# Patient Record
Sex: Male | Born: 1950 | Race: White | Hispanic: No | Marital: Married | Smoking: Former smoker
Health system: Southern US, Community
[De-identification: ages and names within clinical notes are randomized; demographics above are authoritative.]

## PROBLEM LIST (undated history)

## (undated) DIAGNOSIS — R001 Bradycardia, unspecified: Secondary | ICD-10-CM

## (undated) DIAGNOSIS — N189 Chronic kidney disease, unspecified: Secondary | ICD-10-CM

## (undated) DIAGNOSIS — M1990 Unspecified osteoarthritis, unspecified site: Secondary | ICD-10-CM

## (undated) DIAGNOSIS — Z8701 Personal history of pneumonia (recurrent): Secondary | ICD-10-CM

## (undated) DIAGNOSIS — I2510 Atherosclerotic heart disease of native coronary artery without angina pectoris: Secondary | ICD-10-CM

## (undated) DIAGNOSIS — I10 Essential (primary) hypertension: Secondary | ICD-10-CM

## (undated) DIAGNOSIS — K429 Umbilical hernia without obstruction or gangrene: Secondary | ICD-10-CM

## (undated) DIAGNOSIS — E785 Hyperlipidemia, unspecified: Secondary | ICD-10-CM

## (undated) DIAGNOSIS — R160 Hepatomegaly, not elsewhere classified: Secondary | ICD-10-CM

## (undated) DIAGNOSIS — IMO0002 Reserved for concepts with insufficient information to code with codable children: Secondary | ICD-10-CM

## (undated) DIAGNOSIS — F419 Anxiety disorder, unspecified: Secondary | ICD-10-CM

## (undated) HISTORY — PX: COLONOSCOPY: SHX174

## (undated) HISTORY — DX: Essential (primary) hypertension: I10

## (undated) HISTORY — DX: Chronic kidney disease, unspecified: N18.9

## (undated) HISTORY — PX: BACK SURGERY: SHX140

## (undated) HISTORY — PX: CERVICAL SPINE SURGERY: SHX589

## (undated) HISTORY — DX: Anxiety disorder, unspecified: F41.9

## (undated) HISTORY — DX: Umbilical hernia without obstruction or gangrene: K42.9

## (undated) HISTORY — DX: Unspecified osteoarthritis, unspecified site: M19.90

## (undated) HISTORY — DX: Hyperlipidemia, unspecified: E78.5

## (undated) HISTORY — DX: Hepatomegaly, not elsewhere classified: R16.0

## (undated) HISTORY — DX: Reserved for concepts with insufficient information to code with codable children: IMO0002

---
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
## 1986-02-14 HISTORY — PX: UMBILICAL HERNIA REPAIR: SHX196

## 1989-02-14 HISTORY — PX: ROTATOR CUFF REPAIR: SHX139

## 1998-04-08 ENCOUNTER — Ambulatory Visit (HOSPITAL_COMMUNITY): Admission: RE | Admit: 1998-04-08 | Discharge: 1998-04-08 | Payer: Self-pay | Admitting: Neurological Surgery

## 1998-04-08 ENCOUNTER — Encounter: Payer: Self-pay | Admitting: Neurological Surgery

## 1998-04-15 ENCOUNTER — Encounter: Payer: Self-pay | Admitting: Neurological Surgery

## 1998-04-15 ENCOUNTER — Ambulatory Visit (HOSPITAL_COMMUNITY): Admission: RE | Admit: 1998-04-15 | Discharge: 1998-04-15 | Payer: Self-pay | Admitting: Neurosurgery

## 1998-08-11 ENCOUNTER — Ambulatory Visit (HOSPITAL_COMMUNITY)
Admission: RE | Admit: 1998-08-11 | Discharge: 1998-08-11 | Payer: Self-pay | Admitting: Physical Medicine & Rehabilitation

## 1998-08-11 ENCOUNTER — Encounter: Payer: Self-pay | Admitting: Physical Medicine & Rehabilitation

## 1998-08-25 ENCOUNTER — Ambulatory Visit (HOSPITAL_COMMUNITY)
Admission: RE | Admit: 1998-08-25 | Discharge: 1998-08-25 | Payer: Self-pay | Admitting: Physical Medicine & Rehabilitation

## 1998-08-25 ENCOUNTER — Encounter: Payer: Self-pay | Admitting: Physical Medicine & Rehabilitation

## 1998-09-08 ENCOUNTER — Ambulatory Visit (HOSPITAL_COMMUNITY)
Admission: RE | Admit: 1998-09-08 | Discharge: 1998-09-08 | Payer: Self-pay | Admitting: Physical Medicine & Rehabilitation

## 1998-09-08 ENCOUNTER — Encounter: Payer: Self-pay | Admitting: Physical Medicine & Rehabilitation

## 1998-09-30 ENCOUNTER — Encounter: Payer: Self-pay | Admitting: Specialist

## 1998-09-30 ENCOUNTER — Ambulatory Visit (HOSPITAL_COMMUNITY)
Admission: RE | Admit: 1998-09-30 | Discharge: 1998-09-30 | Payer: Self-pay | Admitting: Physical Medicine & Rehabilitation

## 1998-10-06 ENCOUNTER — Encounter: Payer: Self-pay | Admitting: Emergency Medicine

## 1998-10-06 ENCOUNTER — Emergency Department (HOSPITAL_COMMUNITY): Admission: EM | Admit: 1998-10-06 | Discharge: 1998-10-06 | Payer: Self-pay | Admitting: Emergency Medicine

## 1998-10-07 ENCOUNTER — Ambulatory Visit (HOSPITAL_COMMUNITY): Admission: RE | Admit: 1998-10-07 | Discharge: 1998-10-07 | Payer: Self-pay | Admitting: *Deleted

## 1998-12-01 ENCOUNTER — Ambulatory Visit (HOSPITAL_COMMUNITY): Admission: RE | Admit: 1998-12-01 | Discharge: 1998-12-01 | Payer: Self-pay | Admitting: Neurological Surgery

## 1998-12-01 ENCOUNTER — Encounter: Payer: Self-pay | Admitting: Neurological Surgery

## 1998-12-10 ENCOUNTER — Encounter: Payer: Self-pay | Admitting: Neurological Surgery

## 1998-12-14 ENCOUNTER — Inpatient Hospital Stay (HOSPITAL_COMMUNITY): Admission: RE | Admit: 1998-12-14 | Discharge: 1998-12-18 | Payer: Self-pay | Admitting: Neurological Surgery

## 1998-12-14 ENCOUNTER — Encounter: Payer: Self-pay | Admitting: Neurological Surgery

## 1998-12-31 ENCOUNTER — Encounter: Payer: Self-pay | Admitting: Neurological Surgery

## 1998-12-31 ENCOUNTER — Encounter: Admission: RE | Admit: 1998-12-31 | Discharge: 1998-12-31 | Payer: Self-pay | Admitting: Neurological Surgery

## 1999-02-10 ENCOUNTER — Encounter: Admission: RE | Admit: 1999-02-10 | Discharge: 1999-02-10 | Payer: Self-pay | Admitting: Neurological Surgery

## 1999-02-10 ENCOUNTER — Encounter: Payer: Self-pay | Admitting: Neurological Surgery

## 1999-03-16 ENCOUNTER — Encounter: Admission: RE | Admit: 1999-03-16 | Discharge: 1999-06-14 | Payer: Self-pay | Admitting: Neurological Surgery

## 1999-04-14 ENCOUNTER — Encounter: Payer: Self-pay | Admitting: Neurological Surgery

## 1999-04-14 ENCOUNTER — Encounter: Admission: RE | Admit: 1999-04-14 | Discharge: 1999-04-14 | Payer: Self-pay | Admitting: Neurological Surgery

## 2006-11-24 ENCOUNTER — Encounter: Admission: RE | Admit: 2006-11-24 | Discharge: 2006-11-24 | Payer: Self-pay | Admitting: Gastroenterology

## 2009-07-18 ENCOUNTER — Emergency Department (HOSPITAL_COMMUNITY): Admission: EM | Admit: 2009-07-18 | Discharge: 2009-07-18 | Payer: Self-pay | Admitting: Emergency Medicine

## 2010-05-03 LAB — BASIC METABOLIC PANEL
BUN: 15 mg/dL (ref 6–23)
CO2: 27 mEq/L (ref 19–32)
Calcium: 9 mg/dL (ref 8.4–10.5)
Chloride: 106 mEq/L (ref 96–112)
Creatinine, Ser: 0.94 mg/dL (ref 0.4–1.5)
GFR calc Af Amer: >60 mL/min (ref >60)
GFR calc non Af Amer: >60 mL/min (ref >60)
Glucose, Bld: 82 mg/dL (ref 70–99)
Potassium: 4 mEq/L (ref 3.5–5.1)
Sodium: 138 mEq/L (ref 135–145)

## 2010-05-03 LAB — DIFFERENTIAL
Basophils Absolute: 0 10*3/uL (ref 0.0–0.1)
Basophils Relative: 1 % (ref 0–1)
Eosinophils Absolute: 0 10*3/uL (ref 0.0–0.7)
Eosinophils Relative: 0 % (ref 0–5)
Lymphocytes Relative: 35 % (ref 12–46)
Lymphs Abs: 2.6 10*3/uL (ref 0.7–4.0)
Monocytes Absolute: 1 10*3/uL (ref 0.1–1.0)
Monocytes Relative: 13 % — ABNORMAL HIGH (ref 3–12)
Neutro Abs: 3.8 10*3/uL (ref 1.7–7.7)
Neutrophils Relative %: 51 % (ref 43–77)

## 2010-05-03 LAB — CBC
HCT: 45 % (ref 39.0–52.0)
Hemoglobin: 15.7 g/dL (ref 13.0–17.0)
MCHC: 34.8 g/dL (ref 30.0–36.0)
MCV: 91.3 fL (ref 78.0–100.0)
Platelets: 267 10*3/uL (ref 150–400)
RBC: 4.93 MIL/uL (ref 4.22–5.81)
RDW: 13.6 % (ref 11.5–15.5)
WBC: 7.4 10*3/uL (ref 4.0–10.5)

## 2010-05-03 LAB — URINALYSIS, ROUTINE W REFLEX MICROSCOPIC
Bilirubin Urine: NEGATIVE
Glucose, UA: NEGATIVE mg/dL
Hgb urine dipstick: NEGATIVE
Ketones, ur: NEGATIVE mg/dL
Nitrite: NEGATIVE
Protein, ur: NEGATIVE mg/dL
Specific Gravity, Urine: 1.026 (ref 1.005–1.030)
Urobilinogen, UA: 0.2 mg/dL (ref 0.0–1.0)
pH: 5.5 (ref 5.0–8.0)

## 2010-05-03 LAB — ETHANOL: Alcohol, Ethyl (B): <5 mg/dL (ref 0–10)
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
## 2010-08-15 HISTORY — PX: NM MYOCAR PERF WALL MOTION: HXRAD629

## 2011-03-13 ENCOUNTER — Ambulatory Visit (INDEPENDENT_AMBULATORY_CARE_PROVIDER_SITE_OTHER): Payer: 59

## 2011-03-13 DIAGNOSIS — J029 Acute pharyngitis, unspecified: Secondary | ICD-10-CM

## 2011-03-14 ENCOUNTER — Encounter: Payer: Self-pay | Admitting: *Deleted

## 2011-03-14 DIAGNOSIS — E1169 Type 2 diabetes mellitus with other specified complication: Secondary | ICD-10-CM | POA: Insufficient documentation

## 2011-03-14 DIAGNOSIS — I10 Essential (primary) hypertension: Secondary | ICD-10-CM

## 2011-03-14 DIAGNOSIS — E1159 Type 2 diabetes mellitus with other circulatory complications: Secondary | ICD-10-CM | POA: Insufficient documentation

## 2011-03-14 DIAGNOSIS — I152 Hypertension secondary to endocrine disorders: Secondary | ICD-10-CM | POA: Insufficient documentation

## 2011-03-14 DIAGNOSIS — E785 Hyperlipidemia, unspecified: Secondary | ICD-10-CM

## 2011-04-12 ENCOUNTER — Ambulatory Visit (INDEPENDENT_AMBULATORY_CARE_PROVIDER_SITE_OTHER): Payer: 59 | Admitting: Emergency Medicine

## 2011-04-12 DIAGNOSIS — I10 Essential (primary) hypertension: Secondary | ICD-10-CM

## 2011-04-12 DIAGNOSIS — E782 Mixed hyperlipidemia: Secondary | ICD-10-CM

## 2011-04-12 DIAGNOSIS — E119 Type 2 diabetes mellitus without complications: Secondary | ICD-10-CM

## 2011-04-12 DIAGNOSIS — E785 Hyperlipidemia, unspecified: Secondary | ICD-10-CM

## 2011-04-12 LAB — COMPREHENSIVE METABOLIC PANEL
ALT: 27 U/L (ref 0–53)
AST: 21 U/L (ref 0–37)
Albumin: 4.4 g/dL (ref 3.5–5.2)
Alkaline Phosphatase: 80 U/L (ref 39–117)
BUN: 17 mg/dL (ref 6–23)
CO2: 22 mEq/L (ref 19–32)
Calcium: 9.3 mg/dL (ref 8.4–10.5)
Chloride: 109 mEq/L (ref 96–112)
Creat: 0.98 mg/dL (ref 0.50–1.35)
Glucose, Bld: 117 mg/dL — ABNORMAL HIGH (ref 70–99)
Potassium: 4.7 mEq/L (ref 3.5–5.3)
Sodium: 140 mEq/L (ref 135–145)
Total Bilirubin: 0.5 mg/dL (ref 0.3–1.2)
Total Protein: 6.7 g/dL (ref 6.0–8.3)

## 2011-04-12 LAB — LIPID PANEL
Cholesterol: 120 mg/dL (ref 0–200)
HDL: 37 mg/dL — ABNORMAL LOW (ref >39)
LDL Cholesterol: 69 mg/dL (ref 0–99)
Total CHOL/HDL Ratio: 3.2 Ratio
Triglycerides: 71 mg/dL (ref <150)
VLDL: 14 mg/dL (ref 0–40)

## 2011-04-12 LAB — POCT GLYCOSYLATED HEMOGLOBIN (HGB A1C): Hemoglobin A1C: 6.1

## 2011-04-12 LAB — GLUCOSE, POCT (MANUAL RESULT ENTRY): POC Glucose: 124

## 2011-04-12 NOTE — Progress Notes (Signed)
  Subjective:    Patient ID: Seth Pope, male    DOB: [birth year], 61 y.o.   MRN: [id number]  HPI issue management although his diabetes. He is actually doing very well. He is following his diet exercising and taking his medications as instructed. He says his sugars have been running    Review of Systems  Constitutional: Negative.   HENT: Negative.   Eyes: Negative.   Respiratory: Negative.   Cardiovascular: Negative.   Gastrointestinal: Negative.   Genitourinary: Negative.   Musculoskeletal: Negative.   Neurological: Negative.   Hematological: Negative.   Psychiatric/Behavioral: Negative.        Objective:   Physical Exam  Constitutional: He appears well-nourished.  HENT:  Head: Normocephalic and atraumatic.  Right Ear: External ear normal.  Left Ear: External ear normal.  Nose: Nose normal.  Mouth/Throat: Oropharynx is clear and moist.  Eyes: EOM are normal. Pupils are equal, round, and reactive to light.  Neck: No JVD present. No tracheal deviation present. No thyromegaly present.  Cardiovascular: Normal rate, regular rhythm, normal heart sounds and intact distal pulses.   Pulmonary/Chest: No respiratory distress. He has no wheezes. He has no rales. He exhibits no tenderness.  Abdominal: He exhibits no distension. There is no tenderness. There is no rebound.  Lymphadenopathy:    He has no cervical adenopathy.  Neurological:       Patient had normal sensation to his feet with good distal pulses.          Assessment & Plan:    Patient is done well with his diabetes we'll check a hemoglobin A1c and glucose.If These are acceptable recheck 3-4 months.

## 2011-05-22 ENCOUNTER — Ambulatory Visit (INDEPENDENT_AMBULATORY_CARE_PROVIDER_SITE_OTHER): Payer: 59 | Admitting: Emergency Medicine

## 2011-05-22 VITALS — BP 146/79 | HR 67 | Temp 98.6°F | Resp 16 | Ht 69.5 in | Wt 197.0 lb

## 2011-05-22 DIAGNOSIS — J019 Acute sinusitis, unspecified: Secondary | ICD-10-CM

## 2011-05-22 DIAGNOSIS — J329 Chronic sinusitis, unspecified: Secondary | ICD-10-CM

## 2011-05-22 DIAGNOSIS — J029 Acute pharyngitis, unspecified: Secondary | ICD-10-CM

## 2011-05-22 DIAGNOSIS — R070 Pain in throat: Secondary | ICD-10-CM

## 2011-05-22 LAB — POCT RAPID STREP A (OFFICE): Rapid Strep A Screen: NEGATIVE

## 2011-05-22 MED ORDER — DOXYCYCLINE HYCLATE 100 MG PO TABS: 100.0000 mg | ORAL_TABLET | Freq: Two times a day (BID) | ORAL | Status: AC

## 2011-05-22 NOTE — Progress Notes (Signed)
  Subjective:    Patient ID: Seth Pope, male    DOB: 16-Oct-1950, 61 y.o.   MRN: [id number]  HPI patient should 11 day history of sore throat associated with eustachian tube dysfunction. [place] and developed severe pain in his throat and sinus area. He denies any cough.    Review of Systems patient has a pertinent history in that he has diabetes high cholesterol high blood pressure but all these problems are under what her control. His most recent checkup was excellent    Objective:   Physical Exam  Constitutional: He appears well-developed and well-nourished.  HENT:  Head: Normocephalic.  Right Ear: External ear normal.  Left Ear: External ear normal.       The throat is erythematous however there is no exudate noted.  Eyes: Pupils are equal, round, and reactive to light.  Neck: No JVD present. No tracheal deviation present. No thyromegaly present.  Cardiovascular: Normal rate and regular rhythm.   Lymphadenopathy:    He has no cervical adenopathy.          Assessment & Plan:  We'll check a strep test and treat his sinuses. Rapid strep was negative.

## 2011-06-21 ENCOUNTER — Other Ambulatory Visit: Payer: Self-pay | Admitting: Physician Assistant

## 2011-06-30 ENCOUNTER — Other Ambulatory Visit: Payer: Self-pay | Admitting: Emergency Medicine

## 2011-07-24 ENCOUNTER — Other Ambulatory Visit: Payer: Self-pay | Admitting: Physician Assistant

## 2011-08-21 ENCOUNTER — Other Ambulatory Visit: Payer: Self-pay | Admitting: Physician Assistant

## 2011-09-17 ENCOUNTER — Other Ambulatory Visit: Payer: Self-pay | Admitting: Physician Assistant

## 2011-09-24 ENCOUNTER — Other Ambulatory Visit: Payer: Self-pay | Admitting: Physician Assistant

## 2011-09-27 ENCOUNTER — Ambulatory Visit (INDEPENDENT_AMBULATORY_CARE_PROVIDER_SITE_OTHER): Payer: 59 | Admitting: Emergency Medicine

## 2011-09-27 ENCOUNTER — Encounter: Payer: Self-pay | Admitting: Emergency Medicine

## 2011-09-27 VITALS — BP 110/58 | HR 63 | Temp 98.3°F | Resp 16 | Ht 68.5 in | Wt 195.0 lb

## 2011-09-27 DIAGNOSIS — E782 Mixed hyperlipidemia: Secondary | ICD-10-CM

## 2011-09-27 DIAGNOSIS — E785 Hyperlipidemia, unspecified: Secondary | ICD-10-CM

## 2011-09-27 DIAGNOSIS — G4700 Insomnia, unspecified: Secondary | ICD-10-CM

## 2011-09-27 DIAGNOSIS — I10 Essential (primary) hypertension: Secondary | ICD-10-CM

## 2011-09-27 DIAGNOSIS — E119 Type 2 diabetes mellitus without complications: Secondary | ICD-10-CM

## 2011-09-27 LAB — COMPREHENSIVE METABOLIC PANEL
ALT: 23 U/L (ref 0–53)
AST: 18 U/L (ref 0–37)
Albumin: 4.1 g/dL (ref 3.5–5.2)
Alkaline Phosphatase: 75 U/L (ref 39–117)
BUN: 20 mg/dL (ref 6–23)
CO2: 24 mEq/L (ref 19–32)
Calcium: 8.9 mg/dL (ref 8.4–10.5)
Chloride: 108 mEq/L (ref 96–112)
Creat: 0.96 mg/dL (ref 0.50–1.35)
Glucose, Bld: 114 mg/dL — ABNORMAL HIGH (ref 70–99)
Potassium: 4.4 mEq/L (ref 3.5–5.3)
Sodium: 141 mEq/L (ref 135–145)
Total Bilirubin: 0.3 mg/dL (ref 0.3–1.2)
Total Protein: 6.2 g/dL (ref 6.0–8.3)

## 2011-09-27 LAB — LIPID PANEL
Cholesterol: 132 mg/dL (ref 0–200)
HDL: 38 mg/dL — ABNORMAL LOW (ref >39)
LDL Cholesterol: 72 mg/dL (ref 0–99)
Total CHOL/HDL Ratio: 3.5 Ratio
Triglycerides: 110 mg/dL (ref <150)
VLDL: 22 mg/dL (ref 0–40)

## 2011-09-27 LAB — POCT GLYCOSYLATED HEMOGLOBIN (HGB A1C): Hemoglobin A1C: 6

## 2011-09-27 LAB — GLUCOSE, POCT (MANUAL RESULT ENTRY): POC Glucose: 121 mg/dl — AB (ref 70–99)

## 2011-09-27 MED ORDER — FENOFIBRATE 48 MG PO TABS: 48.0000 mg | ORAL_TABLET | Freq: Every day | ORAL | Status: DC

## 2011-09-27 MED ORDER — ATORVASTATIN CALCIUM 80 MG PO TABS: 80.0000 mg | ORAL_TABLET | Freq: Every day | ORAL | Status: DC

## 2011-09-27 MED ORDER — ALPRAZOLAM 0.25 MG PO TABS: 0.2500 mg | ORAL_TABLET | Freq: Four times a day (QID) | ORAL | Status: DC | PRN

## 2011-09-27 MED ORDER — LISINOPRIL-HYDROCHLOROTHIAZIDE 20-12.5 MG PO TABS: 1.0000 | ORAL_TABLET | Freq: Every day | ORAL | Status: DC

## 2011-09-27 MED ORDER — DESIPRAMINE HCL 50 MG PO TABS: 150.0000 mg | ORAL_TABLET | Freq: Every evening | ORAL | Status: DC | PRN

## 2011-09-27 MED ORDER — METOPROLOL SUCCINATE ER 200 MG PO TB24: 200.0000 mg | ORAL_TABLET | Freq: Every day | ORAL | Status: DC

## 2011-09-27 MED ORDER — METFORMIN HCL ER 500 MG PO TB24: 500.0000 mg | ORAL_TABLET | Freq: Two times a day (BID) | ORAL | Status: DC

## 2011-09-27 NOTE — Progress Notes (Signed)
  Subjective:    Patient ID: Seth Pope, male    DOB: Nov 17, 1950, 61 y.o.   MRN: [id number]  HPI patient in the followup of diabetes high blood and high cholesterol. He is doing well without specific problems. He denies chest pain shortness of breath or other new symptoms to    Review of Systems     Objective:   Physical Exam  Constitutional: He is oriented to person, place, and time. He appears well-developed and well-nourished.  HENT:  Head: Normocephalic and atraumatic.  Eyes: Pupils are equal, round, and reactive to light.  Neck: Normal range of motion. Thyromegaly present.  Cardiovascular: Normal rate and regular rhythm.   Pulmonary/Chest: Effort normal and breath sounds normal.  Abdominal: Soft. Bowel sounds are normal.  Neurological: He is alert and oriented to person, place, and time. He has normal reflexes.      Results for orders placed in visit on 09/27/11  GLUCOSE, POCT (MANUAL RESULT ENTRY)      Component Value Range   POC Glucose 121 (*) 70 - 99 mg/dl  POCT GLYCOSYLATED HEMOGLOBIN (HGB A1C)      Component Value Range   Hemoglobin A1C 6.0        Assessment & Plan:  Blood pressure and diabetes are at goal. No changes at present.

## 2011-09-28 ENCOUNTER — Other Ambulatory Visit: Payer: Self-pay | Admitting: Emergency Medicine

## 2011-11-21 ENCOUNTER — Other Ambulatory Visit: Payer: Self-pay | Admitting: Radiology

## 2011-11-21 DIAGNOSIS — E785 Hyperlipidemia, unspecified: Secondary | ICD-10-CM

## 2011-11-21 MED ORDER — FENOFIBRATE 48 MG PO TABS: 48.0000 mg | ORAL_TABLET | Freq: Every day | ORAL | Status: DC

## 2012-01-31 ENCOUNTER — Encounter: Payer: Self-pay | Admitting: Emergency Medicine

## 2012-01-31 ENCOUNTER — Ambulatory Visit (INDEPENDENT_AMBULATORY_CARE_PROVIDER_SITE_OTHER): Payer: 59 | Admitting: Emergency Medicine

## 2012-01-31 VITALS — BP 118/78 | HR 71 | Temp 98.3°F | Resp 16 | Ht 69.0 in | Wt 191.6 lb

## 2012-01-31 DIAGNOSIS — Z23 Encounter for immunization: Secondary | ICD-10-CM

## 2012-01-31 DIAGNOSIS — L678 Other hair color and hair shaft abnormalities: Secondary | ICD-10-CM

## 2012-01-31 DIAGNOSIS — E1149 Type 2 diabetes mellitus with other diabetic neurological complication: Secondary | ICD-10-CM

## 2012-01-31 DIAGNOSIS — E1140 Type 2 diabetes mellitus with diabetic neuropathy, unspecified: Secondary | ICD-10-CM

## 2012-01-31 DIAGNOSIS — L738 Other specified follicular disorders: Secondary | ICD-10-CM

## 2012-01-31 DIAGNOSIS — G629 Polyneuropathy, unspecified: Secondary | ICD-10-CM

## 2012-01-31 DIAGNOSIS — E1142 Type 2 diabetes mellitus with diabetic polyneuropathy: Secondary | ICD-10-CM

## 2012-01-31 DIAGNOSIS — G589 Mononeuropathy, unspecified: Secondary | ICD-10-CM

## 2012-01-31 DIAGNOSIS — L739 Follicular disorder, unspecified: Secondary | ICD-10-CM

## 2012-01-31 LAB — LIPID PANEL
Cholesterol: 103 mg/dL (ref 0–200)
HDL: 43 mg/dL (ref >39)
LDL Cholesterol: 46 mg/dL (ref 0–99)
Total CHOL/HDL Ratio: 2.4 Ratio
Triglycerides: 71 mg/dL (ref <150)
VLDL: 14 mg/dL (ref 0–40)

## 2012-01-31 LAB — GLUCOSE, POCT (MANUAL RESULT ENTRY): POC Glucose: 139 mg/dl — AB (ref 70–99)

## 2012-01-31 LAB — COMPREHENSIVE METABOLIC PANEL
ALT: 25 U/L (ref 0–53)
AST: 22 U/L (ref 0–37)
Albumin: 4.2 g/dL (ref 3.5–5.2)
Alkaline Phosphatase: 67 U/L (ref 39–117)
BUN: 14 mg/dL (ref 6–23)
CO2: 23 mEq/L (ref 19–32)
Calcium: 9.3 mg/dL (ref 8.4–10.5)
Chloride: 106 mEq/L (ref 96–112)
Creat: 0.97 mg/dL (ref 0.50–1.35)
Glucose, Bld: 128 mg/dL — ABNORMAL HIGH (ref 70–99)
Potassium: 4.4 mEq/L (ref 3.5–5.3)
Sodium: 138 mEq/L (ref 135–145)
Total Bilirubin: 0.6 mg/dL (ref 0.3–1.2)
Total Protein: 6.4 g/dL (ref 6.0–8.3)

## 2012-01-31 LAB — POCT GLYCOSYLATED HEMOGLOBIN (HGB A1C): Hemoglobin A1C: 6.4

## 2012-01-31 MED ORDER — MUPIROCIN 2 % EX OINT: TOPICAL_OINTMENT | Freq: Three times a day (TID) | CUTANEOUS | Status: DC

## 2012-01-31 MED ORDER — GABAPENTIN 300 MG PO CAPS: ORAL_CAPSULE | ORAL | Status: DC

## 2012-01-31 NOTE — Progress Notes (Signed)
  Subjective:    Patient ID: Seth Pope, male    DOB: 26-Apr-1950, 61 y.o.   MRN: [id number]  HPI problem #1 diabetes. Diabetes has been under good control. He continues on his medication regular. He has experienced some tingling numb sensation in the metatarsal area of both feet. He has had no pain in this area. Problem #2 hypertension. He continues on his medication for hypertension monitors his blood pressure and it has been running well. Problem #3 sores on the left side of his head in the winter months he has some dry skin which tends to breakdown and he uses Neosporin to these areas. Problem number for her high cholesterol and triglycerides. He continues on diet and medications to keep this under control.    Review of Systems     Objective:   Physical Exam HEENT exam reveals some abraded areas on the left side of his head. His neck is supple there are no carotid bruits. His chest is clear to auscultation and percussion. His cardiac exam reveals a regular rate without murmurs the abdomen is soft liver and spleen are not enlarged there are no areas of tenderness. Examination of the feet reveal pulses to be 2+ dorsalis pedis and posterior tibial. He has decreased sensation from mid foot down on both feet.        Assessment & Plan:  Medical problems are stable except for development of peripheral neuropathy. We'll give a trial of gabapentin to see if we can get him some relief. His of folliculitis on the left side of his forehead where he has scratched his skin and uninfected. I will cover this with Bactroban ointment. He was also given a flu shot today to update him to

## 2012-02-01 ENCOUNTER — Encounter: Payer: Self-pay | Admitting: Radiology

## 2012-02-12 ENCOUNTER — Other Ambulatory Visit: Payer: Self-pay | Admitting: Physician Assistant

## 2012-05-11 NOTE — Progress Notes (Signed)
Prior auth approved for fenofibrate 48 mg through 05/11/13. Pharm notified.

## 2012-05-15 ENCOUNTER — Ambulatory Visit (INDEPENDENT_AMBULATORY_CARE_PROVIDER_SITE_OTHER): Payer: 59 | Admitting: Emergency Medicine

## 2012-05-15 ENCOUNTER — Telehealth: Payer: Self-pay | Admitting: Radiology

## 2012-05-15 VITALS — BP 118/66 | HR 66 | Temp 96.7°F | Resp 16 | Ht 70.5 in | Wt 190.0 lb

## 2012-05-15 DIAGNOSIS — R109 Unspecified abdominal pain: Secondary | ICD-10-CM

## 2012-05-15 DIAGNOSIS — G629 Polyneuropathy, unspecified: Secondary | ICD-10-CM

## 2012-05-15 DIAGNOSIS — G4700 Insomnia, unspecified: Secondary | ICD-10-CM

## 2012-05-15 DIAGNOSIS — I10 Essential (primary) hypertension: Secondary | ICD-10-CM

## 2012-05-15 DIAGNOSIS — E119 Type 2 diabetes mellitus without complications: Secondary | ICD-10-CM

## 2012-05-15 DIAGNOSIS — E785 Hyperlipidemia, unspecified: Secondary | ICD-10-CM

## 2012-05-15 DIAGNOSIS — R1030 Lower abdominal pain, unspecified: Secondary | ICD-10-CM

## 2012-05-15 LAB — LIPID PANEL
Cholesterol: 109 mg/dL (ref 0–200)
HDL: 38 mg/dL — ABNORMAL LOW (ref >39)
LDL Cholesterol: 55 mg/dL (ref 0–99)
Total CHOL/HDL Ratio: 2.9 Ratio
Triglycerides: 80 mg/dL (ref <150)
VLDL: 16 mg/dL (ref 0–40)

## 2012-05-15 LAB — COMPREHENSIVE METABOLIC PANEL
ALT: 19 U/L (ref 0–53)
AST: 17 U/L (ref 0–37)
Albumin: 4.3 g/dL (ref 3.5–5.2)
Alkaline Phosphatase: 75 U/L (ref 39–117)
BUN: 17 mg/dL (ref 6–23)
CO2: 22 mEq/L (ref 19–32)
Calcium: 9.5 mg/dL (ref 8.4–10.5)
Chloride: 105 mEq/L (ref 96–112)
Creat: 1.02 mg/dL (ref 0.50–1.35)
Glucose, Bld: 128 mg/dL — ABNORMAL HIGH (ref 70–99)
Potassium: 4.3 mEq/L (ref 3.5–5.3)
Sodium: 139 mEq/L (ref 135–145)
Total Bilirubin: 0.5 mg/dL (ref 0.3–1.2)
Total Protein: 6.6 g/dL (ref 6.0–8.3)

## 2012-05-15 LAB — POCT GLYCOSYLATED HEMOGLOBIN (HGB A1C): Hemoglobin A1C: 6.5

## 2012-05-15 LAB — GLUCOSE, POCT (MANUAL RESULT ENTRY): POC Glucose: 153 mg/dl — AB (ref 70–99)

## 2012-05-15 MED ORDER — GABAPENTIN 300 MG PO CAPS: ORAL_CAPSULE | ORAL | Status: DC

## 2012-05-15 MED ORDER — ALPRAZOLAM 0.25 MG PO TABS: 0.2500 mg | ORAL_TABLET | Freq: Every evening | ORAL | Status: DC | PRN

## 2012-05-15 NOTE — Progress Notes (Signed)
  Subjective:    Patient ID: Seth Pope, male    DOB: August 08, 1950, 62 y.o.   MRN: [id number]  HPI patient has a followup his high blood pressure diabetes high cholesterol and diabetic neuropathy. He also recently has been having pain in his right groin. This started about a month ago and he has increased pain when he lifts or pushes or touches the area. He has had back surgery in the past. He recently went to his optometrist for an evaluation which revealed no diabetic changes.    Review of Systems     Objective:   Physical Exam HEENT is unremarkable. Neck is supple. There are no carotid bruits. Cardiac exam is regular rate and rhythm abdomen is flat and no masses felt extremity exam reveals decreased sensation across the metatarsals bilaterally pulses are 1+ and symmetrical. Genitourinary exam reveals tenderness at the right external inguinal ring. I do not feel a definite hernia and the testicle felt normal.        Assessment & Plan:  Routine labs were done today. Referral made to surgery for evaluation for possible right inguinal hernia. I did increase his Neurontin to take one in the morning and 2 at night to see if that would help with his neuropathy symptoms. He has discomfort along the metatarsals of both feet.

## 2012-05-15 NOTE — Telephone Encounter (Signed)
Patient asking about his Tricor, Dr Cleta Alberts is with him. Dr Cleta Alberts advised Britta Mccreedy has gotten authorization for this for 1 year.

## 2012-05-21 NOTE — Progress Notes (Signed)
Please call patient see if he can get the name of the cardiologist he saw for his stress test

## 2012-05-22 ENCOUNTER — Encounter (INDEPENDENT_AMBULATORY_CARE_PROVIDER_SITE_OTHER): Payer: Self-pay | Admitting: General Surgery

## 2012-05-22 ENCOUNTER — Ambulatory Visit (INDEPENDENT_AMBULATORY_CARE_PROVIDER_SITE_OTHER): Payer: 59 | Admitting: General Surgery

## 2012-05-22 VITALS — BP 128/84 | HR 68 | Temp 97.8°F | Resp 16 | Ht 70.5 in | Wt 192.6 lb

## 2012-05-22 DIAGNOSIS — R1031 Right lower quadrant pain: Secondary | ICD-10-CM | POA: Insufficient documentation

## 2012-05-22 NOTE — Patient Instructions (Signed)
Plan for ct pelvis to look for right inguinal hernia

## 2012-05-22 NOTE — Progress Notes (Signed)
Subjective:     Patient ID: Seth Pope, male   DOB: 11/03/1950, 62 y.o.   MRN: [id number]  HPI We are asked to see the patient in consultation by Dr. Cleta Alberts to evaluate him for a right inguinal hernia. The patient is a 62 year old white male who began having right groin pain a couple months ago. He has not noticed a bulge in that area. He has had one episode of nausea associated with pain. He otherwise has no problems moving his bowels.  Review of Systems  Constitutional: Negative.   HENT: Negative.   Eyes: Negative.   Respiratory: Negative.   Cardiovascular: Negative.   Gastrointestinal: Negative.   Endocrine: Negative.   Genitourinary: Negative.   Musculoskeletal: Negative.   Skin: Negative.   Allergic/Immunologic: Negative.   Neurological: Negative.   Hematological: Negative.   Psychiatric/Behavioral: Negative.        Objective:   Physical Exam  Constitutional: He is oriented to person, place, and time. He appears well-developed and well-nourished.  HENT:  Head: Normocephalic and atraumatic.  Eyes: Conjunctivae and EOM are normal. Pupils are equal, round, and reactive to light.  Neck: Normal range of motion. Neck supple.  Cardiovascular: Normal rate, regular rhythm and normal heart sounds.   Pulmonary/Chest: Effort normal and breath sounds normal.  Abdominal: Soft. Bowel sounds are normal.  Genitourinary:  There is pain with palpation of the right inguinal canal but no obvious bulge or impulse with straining. There is no palpable bulge or impulse with straining in the left groin.  Musculoskeletal: Normal range of motion.  Neurological: He is alert and oriented to person, place, and time.  Skin: Skin is warm and dry.  Psychiatric: He has a normal mood and affect. His behavior is normal.       Assessment:     The patient has significant right groin pain but clinically I cannot appreciate an inguinal hernia at this time. Because of the sewed recommend a limited CT of the  pelvis to look for radiographic evidence of a hernia. If he does have a right inguinal hernia I have discussed with him in detail the risks and benefits the operation to fix it as well as some of the technical aspects and he understands and wishes to proceed     Plan:     Plan for CT of the pelvis and then we will proceed accordingly

## 2012-05-24 ENCOUNTER — Ambulatory Visit
Admission: RE | Admit: 2012-05-24 | Discharge: 2012-05-24 | Disposition: A | Discharge status: Home / Self Care | Payer: 59 | Source: Ambulatory Visit | Attending: General Surgery | Admitting: General Surgery

## 2012-05-24 DIAGNOSIS — R1031 Right lower quadrant pain: Secondary | ICD-10-CM

## 2012-05-24 MED ORDER — IOHEXOL 300 MG/ML  SOLN
100.0000 mL | Freq: Once | INTRAMUSCULAR | Status: AC | PRN
  Administered 2012-05-24: 100 mL via INTRAVENOUS

## 2012-05-29 ENCOUNTER — Telehealth (INDEPENDENT_AMBULATORY_CARE_PROVIDER_SITE_OTHER): Payer: Self-pay

## 2012-05-29 NOTE — Telephone Encounter (Signed)
Message copied by Brennan Bailey on Tue May 29, 2012  4:20 PM ------      Message from: Caleen Essex III      Created: Mon May 28, 2012  2:55 PM       Scan does not show hernia. He should rest and avoid heavy lifting for a couple weeks if possible. Then return to medical doctor ------

## 2012-05-29 NOTE — Telephone Encounter (Signed)
Called patient and gave negative results. He will take it easy for few weeks and follow up with Dr Cleta Alberts.

## 2012-05-31 ENCOUNTER — Other Ambulatory Visit: Payer: Self-pay | Admitting: Physician Assistant

## 2012-09-04 ENCOUNTER — Ambulatory Visit (INDEPENDENT_AMBULATORY_CARE_PROVIDER_SITE_OTHER): Payer: 59 | Admitting: Emergency Medicine

## 2012-09-04 VITALS — BP 96/59 | HR 58 | Temp 97.1°F | Resp 16 | Ht 69.0 in | Wt 184.0 lb

## 2012-09-04 DIAGNOSIS — I10 Essential (primary) hypertension: Secondary | ICD-10-CM

## 2012-09-04 DIAGNOSIS — E785 Hyperlipidemia, unspecified: Secondary | ICD-10-CM

## 2012-09-04 DIAGNOSIS — E119 Type 2 diabetes mellitus without complications: Secondary | ICD-10-CM

## 2012-09-04 LAB — COMPREHENSIVE METABOLIC PANEL
ALT: 17 U/L (ref 0–53)
AST: 18 U/L (ref 0–37)
Albumin: 4.1 g/dL (ref 3.5–5.2)
Alkaline Phosphatase: 84 U/L (ref 39–117)
BUN: 17 mg/dL (ref 6–23)
CO2: 28 mEq/L (ref 19–32)
Calcium: 9.2 mg/dL (ref 8.4–10.5)
Chloride: 104 mEq/L (ref 96–112)
Creat: 1.09 mg/dL (ref 0.50–1.35)
Glucose, Bld: 127 mg/dL — ABNORMAL HIGH (ref 70–99)
Potassium: 4.9 mEq/L (ref 3.5–5.3)
Sodium: 142 mEq/L (ref 135–145)
Total Bilirubin: 0.4 mg/dL (ref 0.3–1.2)
Total Protein: 6.5 g/dL (ref 6.0–8.3)

## 2012-09-04 LAB — GLUCOSE, POCT (MANUAL RESULT ENTRY): POC Glucose: 135 mg/dl — AB (ref 70–99)

## 2012-09-04 LAB — LIPID PANEL
Cholesterol: 109 mg/dL (ref 0–200)
HDL: 33 mg/dL — ABNORMAL LOW (ref >39)
LDL Cholesterol: 60 mg/dL (ref 0–99)
Total CHOL/HDL Ratio: 3.3 Ratio
Triglycerides: 80 mg/dL (ref <150)
VLDL: 16 mg/dL (ref 0–40)

## 2012-09-04 LAB — POCT GLYCOSYLATED HEMOGLOBIN (HGB A1C): Hemoglobin A1C: 6

## 2012-09-04 NOTE — Progress Notes (Signed)
  Subjective:    Patient ID: Seth Pope, male    DOB: 1951-02-08, 62 y.o.   MRN: [id number]  HPI patient enters for recheck of his diabetes, high blood pressure, high cholesterol, and persistent right groin pain. He states he feels he's been doing well. He has no new complaints. He does feel like his groin pain is worse with activity. He has developed no new symptoms    Review of Systems     Objective:   Physical Exam patient is alert and cooperative particular. His neck is supple. Carotids are without bruits. His neck is supple. His chest is clear to auscultation but person. Heart regular rate no murmurs or gallops. Examination of the right groin reveals testicle to be normal. There is tenderness right at the external inguinal canal. There is no mass felt in this area. Results for orders placed in visit on 09/04/12  GLUCOSE, POCT (MANUAL RESULT ENTRY)      Result Value Range   POC Glucose 135 (*) 70 - 99 mg/dl  POCT GLYCOSYLATED HEMOGLOBIN (HGB A1C)      Result Value Range   Hemoglobin A1C 6.0           Assessment & Plan:  Labs are great no change in medication

## 2012-10-02 ENCOUNTER — Other Ambulatory Visit: Payer: Self-pay | Admitting: Emergency Medicine

## 2012-10-22 ENCOUNTER — Other Ambulatory Visit: Payer: Self-pay | Admitting: Emergency Medicine

## 2012-11-02 ENCOUNTER — Other Ambulatory Visit: Payer: Self-pay | Admitting: Emergency Medicine

## 2012-11-19 ENCOUNTER — Other Ambulatory Visit: Payer: Self-pay | Admitting: Emergency Medicine

## 2013-01-01 ENCOUNTER — Ambulatory Visit (INDEPENDENT_AMBULATORY_CARE_PROVIDER_SITE_OTHER): Payer: 59 | Admitting: Emergency Medicine

## 2013-01-01 ENCOUNTER — Ambulatory Visit: Payer: 59

## 2013-01-01 ENCOUNTER — Encounter: Payer: Self-pay | Admitting: Emergency Medicine

## 2013-01-01 VITALS — BP 115/70 | HR 67 | Temp 97.9°F | Resp 16 | Ht 69.0 in | Wt 180.0 lb

## 2013-01-01 DIAGNOSIS — E119 Type 2 diabetes mellitus without complications: Secondary | ICD-10-CM

## 2013-01-01 DIAGNOSIS — Z1211 Encounter for screening for malignant neoplasm of colon: Secondary | ICD-10-CM

## 2013-01-01 DIAGNOSIS — I10 Essential (primary) hypertension: Secondary | ICD-10-CM

## 2013-01-01 DIAGNOSIS — Z1383 Encounter for screening for respiratory disorder NEC: Secondary | ICD-10-CM

## 2013-01-01 DIAGNOSIS — E785 Hyperlipidemia, unspecified: Secondary | ICD-10-CM

## 2013-01-01 LAB — COMPREHENSIVE METABOLIC PANEL
ALT: 14 U/L (ref 0–53)
AST: 14 U/L (ref 0–37)
Albumin: 4.1 g/dL (ref 3.5–5.2)
Alkaline Phosphatase: 76 U/L (ref 39–117)
BUN: 15 mg/dL (ref 6–23)
CO2: 23 mEq/L (ref 19–32)
Calcium: 9 mg/dL (ref 8.4–10.5)
Chloride: 108 mEq/L (ref 96–112)
Creat: 0.93 mg/dL (ref 0.50–1.35)
Glucose, Bld: 123 mg/dL — ABNORMAL HIGH (ref 70–99)
Potassium: 4.4 mEq/L (ref 3.5–5.3)
Sodium: 138 mEq/L (ref 135–145)
Total Bilirubin: 0.4 mg/dL (ref 0.3–1.2)
Total Protein: 6.5 g/dL (ref 6.0–8.3)

## 2013-01-01 LAB — LIPID PANEL
Cholesterol: 117 mg/dL (ref 0–200)
HDL: 40 mg/dL (ref >39)
LDL Cholesterol: 65 mg/dL (ref 0–99)
Total CHOL/HDL Ratio: 2.9 Ratio
Triglycerides: 61 mg/dL (ref <150)
VLDL: 12 mg/dL (ref 0–40)

## 2013-01-01 LAB — PSA: PSA: 1.56 ng/mL (ref <=4.00)

## 2013-01-01 LAB — GLUCOSE, POCT (MANUAL RESULT ENTRY): POC Glucose: 146 mg/dl — AB (ref 70–99)

## 2013-01-01 LAB — IFOBT (OCCULT BLOOD): IFOBT: NEGATIVE

## 2013-01-01 LAB — POCT GLYCOSYLATED HEMOGLOBIN (HGB A1C): Hemoglobin A1C: 6

## 2013-01-01 NOTE — Progress Notes (Signed)
  Subjective:    Patient ID: Seth Pope, male    DOB: [birth year], 62 y.o.   MRN: [id number]  HPI patient here for followup diabetes high blood triglycerides. He feels good he's been losing weight. His appetite has been good. He has no specific complaints at the present time.    Review of Systems     Objective:   Physical Exam HEENT exam is unremarkable neck is supple there are no carotid bruits his chest is clear his heart is regular rate without murmurs rubs or gallops the abdomen is soft liver spleen not enlarged there are no areas of tenderness and no masses felt sensation is normal in the lower extremities. Prostate does feel slightly firm symmetrical and no definite nodules are palpable .  [facility] reading (PRIMARY) by  Dr. Cleta Alberts no acute disease no signs of emphysema no nodules seen  Results for orders placed in visit on 01/01/13  IFOBT (OCCULT BLOOD)      Result Value Range   IFOBT Negative    GLUCOSE, POCT (MANUAL RESULT ENTRY)      Result Value Range   POC Glucose 146 (*) 70 - 99 mg/dl  POCT GLYCOSYLATED HEMOGLOBIN (HGB A1C)      Result Value Range   Hemoglobin A1C 6.0          Assessment & Plan:  Labs her great. No change in medication. PSA on 08/04/2010 was 1.36. The patient will be scheduled to see Dr. Loreta Ave.

## 2013-02-14 HISTORY — PX: COLONOSCOPY: SHX174

## 2013-03-21 ENCOUNTER — Other Ambulatory Visit: Payer: Self-pay | Admitting: Emergency Medicine

## 2013-03-28 ENCOUNTER — Other Ambulatory Visit: Payer: Self-pay | Admitting: Physician Assistant

## 2013-04-03 ENCOUNTER — Other Ambulatory Visit: Payer: Self-pay | Admitting: Emergency Medicine

## 2013-04-21 ENCOUNTER — Other Ambulatory Visit: Payer: Self-pay | Admitting: Physician Assistant

## 2013-04-30 ENCOUNTER — Other Ambulatory Visit: Payer: Self-pay

## 2013-04-30 MED ORDER — ATORVASTATIN CALCIUM 80 MG PO TABS: ORAL_TABLET | ORAL | Status: DC

## 2013-05-07 ENCOUNTER — Encounter: Payer: Self-pay | Admitting: Emergency Medicine

## 2013-05-07 ENCOUNTER — Ambulatory Visit (INDEPENDENT_AMBULATORY_CARE_PROVIDER_SITE_OTHER): Payer: 59 | Admitting: Emergency Medicine

## 2013-05-07 VITALS — BP 107/67 | HR 61 | Temp 98.1°F | Resp 16 | Ht 69.0 in | Wt 183.0 lb

## 2013-05-07 DIAGNOSIS — Z23 Encounter for immunization: Secondary | ICD-10-CM

## 2013-05-07 DIAGNOSIS — E785 Hyperlipidemia, unspecified: Secondary | ICD-10-CM

## 2013-05-07 DIAGNOSIS — I10 Essential (primary) hypertension: Secondary | ICD-10-CM

## 2013-05-07 DIAGNOSIS — E119 Type 2 diabetes mellitus without complications: Secondary | ICD-10-CM

## 2013-05-07 LAB — COMPLETE METABOLIC PANEL WITH GFR
ALT: 20 U/L (ref 0–53)
AST: 18 U/L (ref 0–37)
Albumin: 4.1 g/dL (ref 3.5–5.2)
Alkaline Phosphatase: 84 U/L (ref 39–117)
BUN: 17 mg/dL (ref 6–23)
CO2: 27 mEq/L (ref 19–32)
Calcium: 9.2 mg/dL (ref 8.4–10.5)
Chloride: 104 mEq/L (ref 96–112)
Creat: 1 mg/dL (ref 0.50–1.35)
GFR, Est African American: >89 mL/min
GFR, Est Non African American: 81 mL/min
Glucose, Bld: 124 mg/dL — ABNORMAL HIGH (ref 70–99)
Potassium: 4.7 mEq/L (ref 3.5–5.3)
Sodium: 140 mEq/L (ref 135–145)
Total Bilirubin: 0.4 mg/dL (ref 0.2–1.2)
Total Protein: 6.4 g/dL (ref 6.0–8.3)

## 2013-05-07 LAB — LIPID PANEL
Cholesterol: 119 mg/dL (ref 0–200)
HDL: 37 mg/dL — ABNORMAL LOW (ref >39)
LDL Cholesterol: 65 mg/dL (ref 0–99)
Total CHOL/HDL Ratio: 3.2 Ratio
Triglycerides: 84 mg/dL (ref <150)
VLDL: 17 mg/dL (ref 0–40)

## 2013-05-07 LAB — POCT GLYCOSYLATED HEMOGLOBIN (HGB A1C): Hemoglobin A1C: 6.3

## 2013-05-07 LAB — GLUCOSE, POCT (MANUAL RESULT ENTRY): POC Glucose: 130 mg/dl — AB (ref 70–99)

## 2013-05-07 NOTE — Progress Notes (Addendum)
  This chart was scribed for Darlyne Russian, MD by Eston Mould, ED Scribe. This patient was seen in room Room/bed 21 and the patient's care was started at 9:51 AM. Subjective:    Patient ID: Seth Pope, male    DOB: 09-17-1950, 63 y.o.   MRN: [id number] Chief Complaint  Patient presents with  . Follow-up    DM  . Medication Refill   HPI Seth Pope is a 63 y.o. male with a hx of DM and shingles who presents to the [facility] for DM F/U and medication refill. Pt states he has been doing well and states he is content with his son and his sons help. He states he has been eating well.  Pt states his groin pain has not caused him pain or trouble since last visit. He states if he strains himself, he will then have groin pain but denies having a constant pain. Pt denies CP and abd pain.  Pt states he does see his Ophthalmologist once a year and reports March being his yearly apt. Pt suspects he is not seeing as well as when he first received his glasses last year but will F/U with provider in the weeks to come.  Pt c/o a tingling sensation to bilateral feet this morning and is unsure why. He denies having constant tingling. Pt denies pain to feet.  Pt denies having shingles and pneumonia vaccine. He reports having had shingles several years ago. Pt states he has had a stress test done and reports the test being done 1-2 years ago.  Review of Systems  Constitutional: Negative for activity change and appetite change.  Cardiovascular: Negative for chest pain.  Gastrointestinal: Negative for abdominal pain.  Neurological:       Tingling sensation to bilateral feet.   Objective:   Physical Exam CONSTITUTIONAL: Well developed/well nourished HEAD: Normocephalic/atraumatic EYES: EOMI/PERRL ENMT: Mucous membranes moist NECK: supple no meningeal signs SPINE:entire spine nontender CV: S1/S2 noted, no murmurs/rubs/gallops noted LUNGS: Lungs are clear to auscultation bilaterally, no apparent  distress ABDOMEN: soft, nontender, no rebound or guarding GU:no cva tenderness NEURO: Pt is awake/alert, moves all extremitiesx4 EXTREMITIES: pulses normal, full ROM SKIN: warm, color normal PSYCH: no abnormalities of mood noted Results for orders placed in visit on 05/07/13  POCT GLYCOSYLATED HEMOGLOBIN (HGB A1C)      Result Value Ref Range   Hemoglobin A1C 6.3    GLUCOSE, POCT (MANUAL RESULT ENTRY)      Result Value Ref Range   POC Glucose 130 (*) 70 - 99 mg/dl    Triage Vitals:BP 107/67  Pulse 61  Temp(Src) 98.1 F (36.7 C)  Resp 16  Ht 5\' 9"  (1.753 m)  Wt 183 lb (83.008 kg)  BMI 27.01 kg/m2  SpO2 98% Assessment & Plan:  Diabetes treatment at goal. Recheck 3-4 months he had cardiac testing 3 years ago with a stress test which was completely normal. He is asymptomatic at the present time. No changes in medications. I personally performed the services described in this documentation, which was scribed in my presence. The recorded information has been reviewed and is accurate.

## 2013-05-30 ENCOUNTER — Telehealth: Payer: Self-pay

## 2013-05-30 MED ORDER — FENOFIBRATE 54 MG PO TABS: 54.0000 mg | ORAL_TABLET | Freq: Every day | ORAL | Status: DC

## 2013-05-30 NOTE — Telephone Encounter (Signed)
PA was needed for fenofibrate 48 mg. Ins advised that 54 mg is covered at lower tier w/no PA needed. Sent in new Rx and notified pt of change on VM.

## 2013-06-22 ENCOUNTER — Other Ambulatory Visit: Payer: Self-pay | Admitting: Emergency Medicine

## 2013-07-16 ENCOUNTER — Other Ambulatory Visit: Payer: Self-pay | Admitting: Emergency Medicine

## 2013-08-13 ENCOUNTER — Ambulatory Visit (INDEPENDENT_AMBULATORY_CARE_PROVIDER_SITE_OTHER): Payer: BC Managed Care – PPO | Admitting: Emergency Medicine

## 2013-08-13 ENCOUNTER — Encounter: Payer: Self-pay | Admitting: Emergency Medicine

## 2013-08-13 VITALS — BP 98/64 | HR 60 | Temp 98.4°F | Resp 16 | Ht 69.25 in | Wt 180.8 lb

## 2013-08-13 DIAGNOSIS — E119 Type 2 diabetes mellitus without complications: Secondary | ICD-10-CM

## 2013-08-13 DIAGNOSIS — E785 Hyperlipidemia, unspecified: Secondary | ICD-10-CM

## 2013-08-13 DIAGNOSIS — I10 Essential (primary) hypertension: Secondary | ICD-10-CM

## 2013-08-13 DIAGNOSIS — R5383 Other fatigue: Secondary | ICD-10-CM

## 2013-08-13 DIAGNOSIS — R5381 Other malaise: Secondary | ICD-10-CM

## 2013-08-13 DIAGNOSIS — E139 Other specified diabetes mellitus without complications: Secondary | ICD-10-CM

## 2013-08-13 LAB — COMPLETE METABOLIC PANEL WITH GFR
ALT: 21 U/L (ref 0–53)
AST: 19 U/L (ref 0–37)
Albumin: 3.9 g/dL (ref 3.5–5.2)
Alkaline Phosphatase: 67 U/L (ref 39–117)
BUN: 20 mg/dL (ref 6–23)
CO2: 24 mEq/L (ref 19–32)
Calcium: 8.8 mg/dL (ref 8.4–10.5)
Chloride: 105 mEq/L (ref 96–112)
Creat: 1.05 mg/dL (ref 0.50–1.35)
GFR, Est African American: 89 mL/min
GFR, Est Non African American: 77 mL/min
Glucose, Bld: 121 mg/dL — ABNORMAL HIGH (ref 70–99)
Potassium: 4.3 mEq/L (ref 3.5–5.3)
Sodium: 140 mEq/L (ref 135–145)
Total Bilirubin: 0.4 mg/dL (ref 0.2–1.2)
Total Protein: 6.3 g/dL (ref 6.0–8.3)

## 2013-08-13 LAB — CBC WITH DIFFERENTIAL/PLATELET
Basophils Absolute: 0.1 10*3/uL (ref 0.0–0.1)
Basophils Relative: 1 % (ref 0–1)
Eosinophils Absolute: 0 10*3/uL (ref 0.0–0.7)
Eosinophils Relative: 0 % (ref 0–5)
HCT: 42.4 % (ref 39.0–52.0)
Hemoglobin: 14.7 g/dL (ref 13.0–17.0)
Lymphocytes Relative: 27 % (ref 12–46)
Lymphs Abs: 2.1 10*3/uL (ref 0.7–4.0)
MCH: 29.1 pg (ref 26.0–34.0)
MCHC: 34.7 g/dL (ref 30.0–36.0)
MCV: 83.8 fL (ref 78.0–100.0)
Monocytes Absolute: 0.6 10*3/uL (ref 0.1–1.0)
Monocytes Relative: 8 % (ref 3–12)
Neutro Abs: 4.9 10*3/uL (ref 1.7–7.7)
Neutrophils Relative %: 64 % (ref 43–77)
Platelets: 322 10*3/uL (ref 150–400)
RBC: 5.06 MIL/uL (ref 4.22–5.81)
RDW: 14 % (ref 11.5–15.5)
WBC: 7.6 10*3/uL (ref 4.0–10.5)

## 2013-08-13 LAB — POCT GLYCOSYLATED HEMOGLOBIN (HGB A1C): Hemoglobin A1C: 6.4

## 2013-08-13 LAB — LIPID PANEL
Cholesterol: 115 mg/dL (ref 0–200)
HDL: 38 mg/dL — ABNORMAL LOW (ref >39)
LDL Cholesterol: 59 mg/dL (ref 0–99)
Total CHOL/HDL Ratio: 3 Ratio
Triglycerides: 90 mg/dL (ref <150)
VLDL: 18 mg/dL (ref 0–40)

## 2013-08-13 LAB — GLUCOSE, POCT (MANUAL RESULT ENTRY): POC Glucose: 137 mg/dl — AB (ref 70–99)

## 2013-08-13 NOTE — Progress Notes (Signed)
   Subjective:    Patient ID: Seth Pope, male    DOB: 1951/01/13, 63 y.o.   MRN: [id number]  HPI patient in for followup of his diabetes, high cholesterol. He denies any chest pain but states at the end of the day he feels fairly fatigued and weak with no energy. He is concerned that maybe this is related to the heat. There've been no other changes    Review of Systems     Objective:   Physical Exam HEENT exam is unremarkable neck supple chest was clear heart regular rate no murmurs abdomen soft. He does have a neuropathy on the bottom of his feet.  Results for orders placed in visit on 08/13/13  GLUCOSE, POCT (MANUAL RESULT ENTRY)      Result Value Ref Range   POC Glucose 137 (*) 70 - 99 mg/dl  POCT GLYCOSYLATED HEMOGLOBIN (HGB A1C)      Result Value Ref Range   Hemoglobin A1C 6.4          Assessment & Plan:  Blood pressures borderline low today. It may be with the heat he has not been drinking enough fluids. He will monitor his blood pressure at home and notify us if it continues to be low. If so we need to back off on some of his medications diabetes treatment is at goal.

## 2013-08-15 ENCOUNTER — Encounter: Payer: Self-pay | Admitting: Family Medicine

## 2013-08-16 ENCOUNTER — Encounter: Payer: Self-pay | Admitting: Family Medicine

## 2013-08-20 ENCOUNTER — Encounter: Payer: Self-pay | Admitting: Cardiovascular Disease

## 2013-08-24 ENCOUNTER — Telehealth: Payer: Self-pay | Admitting: Internal Medicine

## 2013-08-24 NOTE — Telephone Encounter (Signed)
Closed encounter °

## 2013-10-04 ENCOUNTER — Telehealth: Payer: Self-pay | Admitting: *Deleted

## 2013-10-04 ENCOUNTER — Encounter: Payer: Self-pay | Admitting: *Deleted

## 2013-10-04 ENCOUNTER — Encounter (HOSPITAL_COMMUNITY): Payer: Self-pay | Admitting: *Deleted

## 2013-10-04 ENCOUNTER — Encounter: Payer: Self-pay | Admitting: Internal Medicine

## 2013-10-04 ENCOUNTER — Ambulatory Visit (INDEPENDENT_AMBULATORY_CARE_PROVIDER_SITE_OTHER): Payer: BC Managed Care – PPO | Admitting: Internal Medicine

## 2013-10-04 VITALS — BP 136/72 | HR 69 | Ht 70.5 in | Wt 187.2 lb

## 2013-10-04 DIAGNOSIS — I200 Unstable angina: Secondary | ICD-10-CM | POA: Insufficient documentation

## 2013-10-04 DIAGNOSIS — R0602 Shortness of breath: Secondary | ICD-10-CM

## 2013-10-04 DIAGNOSIS — I10 Essential (primary) hypertension: Secondary | ICD-10-CM

## 2013-10-04 DIAGNOSIS — R079 Chest pain, unspecified: Secondary | ICD-10-CM

## 2013-10-04 DIAGNOSIS — Z8249 Family history of ischemic heart disease and other diseases of the circulatory system: Secondary | ICD-10-CM

## 2013-10-04 DIAGNOSIS — E785 Hyperlipidemia, unspecified: Secondary | ICD-10-CM

## 2013-10-04 DIAGNOSIS — E1349 Other specified diabetes mellitus with other diabetic neurological complication: Secondary | ICD-10-CM

## 2013-10-04 DIAGNOSIS — I4949 Other premature depolarization: Secondary | ICD-10-CM

## 2013-10-04 DIAGNOSIS — I493 Ventricular premature depolarization: Secondary | ICD-10-CM | POA: Insufficient documentation

## 2013-10-04 NOTE — Telephone Encounter (Signed)
Dr. Debara Pickett would like patient to start asa 81mg  once daily. Patient notified. He voiced understanding

## 2013-10-04 NOTE — Progress Notes (Signed)
OFFICE NOTE  Chief Complaint:  Chest pain  Primary Care Physician: Jenny Reichmann, MD  HPI:  Seth Pope is a pleasant 63 year old male has been referred to me by Dr. Everlene Farrier. His past medical history significant for hypertension, dyslipidemia, some anxiety, diabetes type 2 and a family history of heart disease. His father had a heart attack and died suddenly at age 75. His brother also has heart disease and recently had a stent. 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 He's had several episodes were his bed awakened in the morning with tightness in the chest. Also felt sometimes her chest and shortness of breath. He was recently on vacation for several days and even during that time had some episodes of chest discomfort. The symptoms are described as a pressure and deep inside his chest. He says the pain is minimal about 2-3/10 but definitely abnormal. He's been having this symptom first some time and has not told his wife but mentioned it to his primary doctor who advised him right away to have it evaluated.  PMHx:  Past Medical History  Diagnosis Date  . Hypertension   . Diabetes mellitus   . Hyperlipidemia   . Chronic kidney disease     h/o kidney stone  . Ulcer   . Umbilical hernia     Past Surgical History  Procedure Laterality Date  . Knee surgery  1970  . Rotator cuff repair  1991    x2  . Umbilical hernia repair    . Back surgery      FAMHx:  Family History  Problem Relation Age of Onset  . Diabetes Sister     HTN  . Diabetes Brother     HTN, heart problems  . Heart attack Father   . Hypertension Father   . Heart disease Father   . Hypertension    . Diabetes Paternal Grandmother   . Heart disease Paternal Grandfather     SOCHx:   reports that he quit smoking about 35 years ago. His smoking use included Cigarettes. He has a 21 pack-year smoking history. He does not have any  smokeless tobacco history on file. He reports that he drinks alcohol. He reports that he does not use illicit drugs.  ALLERGIES:  Allergies  Allergen Reactions  . Penicillins Rash    ROS: A comprehensive review of systems was negative except for: Cardiovascular: positive for exertional chest pressure/discomfort  HOME MEDS: Current Outpatient Prescriptions  Medication Sig Dispense Refill  . ALPRAZolam (XANAX XR) 1 MG 24 hr tablet Take 1 mg by mouth daily.      Marland Kitchen ALPRAZolam (XANAX) 0.25 MG tablet Take 1 mg by mouth at bedtime as needed.      Marland Kitchen atorvastatin (LIPITOR) 80 MG tablet take 1 tablet by mouth once daily  30 tablet  2  . desipramine (NORPRAMIN) 50 MG tablet Take 150 mg by mouth at bedtime.       . fenofibrate 54 MG tablet Take 1 tablet (54 mg total) by mouth daily.  30 tablet  5  . gabapentin (NEURONTIN) 300 MG capsule take 1 capsule by mouth daily      . lisinopril-hydrochlorothiazide (PRINZIDE,ZESTORETIC) 20-12.5 MG per tablet take 1 tablet by mouth once daily  30 tablet  2  . metFORMIN (GLUCOPHAGE-XR) 500 MG 24 hr tablet take 1 tablet by mouth twice a day  60 tablet  3  .  metoprolol (TOPROL-XL) 200 MG 24 hr tablet take 1 tablet by mouth once daily  30 tablet  3   No current facility-administered medications for this visit.    LABS/IMAGING: No results found for this or any previous visit (from the past 48 hour(s)). No results found.  VITALS: BP 136/72  Pulse 69  Ht 5' 10.5" (1.791 m)  Wt 187 lb 3.2 oz (84.913 kg)  BMI 26.47 kg/m2  EXAM: General appearance: alert and no distress Neck: no carotid bruit and no JVD Lungs: clear to auscultation bilaterally Heart: regular rate and rhythm, S1, S2 normal, no murmur, click, rub or gallop Abdomen: soft, non-tender; bowel sounds normal; no masses,  no organomegaly Extremities: extremities normal, atraumatic, no cyanosis or edema Pulses: 2+ and symmetric Skin: Skin color, texture, turgor normal. No rashes or  lesions Neurologic: Grossly normal Psych: Normal  EKG: Sinus rhythm with occasional PVCs at 69  ASSESSMENT: 1. Chest pain concerning for unstable angina 2. Abnormal EKG with PVCs 3. Hypertension 4. Dyslipidemia 5. Diabetes type 2 with diabetic neuropathy  PLAN: 1.   Mr. Coolman is having symptoms concerning for unstable angina. He has multiple cardiac risk factors including a strong family history of premature coronary artery disease. I'm concerned about angina and would recommend an exercise nuclear stress test. He's not currently taking aspirin to my knowledge and I would recommend he start on a low-dose aspirin. Plan to see him back to discuss results of his stress test after is completed. He was advised if he were to have any more significant angina that did not go away that he should go to emergency room for evaluation.  Thank you as always for the kind referral.  Pixie Casino, MD, [facility] Attending Cardiologist [facility]  Rawad Bochicchio C 10/04/2013, 4:39 PM

## 2013-10-04 NOTE — Patient Instructions (Signed)
Your physician has requested that you have a lexiscan myoview. For further information please visit [email or web address]. Please follow instruction sheet, as given.  Your physician recommends that you schedule a follow-up appointment after your stress test.   

## 2013-10-05 ENCOUNTER — Telehealth: Payer: Self-pay | Admitting: Emergency Medicine

## 2013-10-05 ENCOUNTER — Other Ambulatory Visit: Payer: Self-pay | Admitting: Emergency Medicine

## 2013-10-05 ENCOUNTER — Encounter: Payer: Self-pay | Admitting: Family Medicine

## 2013-10-05 MED ORDER — NITROGLYCERIN 0.4 MG SL SUBL: SUBLINGUAL_TABLET | SUBLINGUAL | Status: DC

## 2013-10-05 NOTE — Telephone Encounter (Signed)
I called and spoke with patient about his recent visit to the cardiologist. I also added some nitroglycerin for him to have as needed. He will start on a baby aspirin a day as instructed by the cardiologist.

## 2013-10-06 ENCOUNTER — Telehealth: Payer: Self-pay | Admitting: Emergency Medicine

## 2013-10-06 NOTE — Telephone Encounter (Signed)
I called and spoke with Seth Pope. He states he is feeling well. I advised him to take 2 baby aspirin a day until after his procedure. He also has nitroglycerin to take for pain. Advised him that if he were to experience chest pain to take his nitroglycerin and proceed to the hospital.

## 2013-10-06 NOTE — Telephone Encounter (Signed)
Called and spoke with patient. He has had a couple episodes of tightness lasting less than 2 minutes. This was not enough for him to take any nitroglycerin. He has been resting and not doing any activity. He was again advised to not be physically active and  to present himself to the emergency room if he had any sustained episodes of pain and to take his nitroglycerin.

## 2013-10-08 ENCOUNTER — Ambulatory Visit (HOSPITAL_COMMUNITY)
Admission: RE | Admit: 2013-10-08 | Discharge: 2013-10-08 | Disposition: A | Discharge status: Home / Self Care | Payer: BC Managed Care – PPO | Source: Ambulatory Visit | Attending: Internal Medicine | Admitting: Internal Medicine

## 2013-10-08 ENCOUNTER — Telehealth: Payer: Self-pay | Admitting: Internal Medicine

## 2013-10-08 DIAGNOSIS — R0602 Shortness of breath: Secondary | ICD-10-CM | POA: Diagnosis present

## 2013-10-08 DIAGNOSIS — R079 Chest pain, unspecified: Secondary | ICD-10-CM | POA: Diagnosis present

## 2013-10-08 DIAGNOSIS — I10 Essential (primary) hypertension: Secondary | ICD-10-CM

## 2013-10-08 DIAGNOSIS — Z8249 Family history of ischemic heart disease and other diseases of the circulatory system: Secondary | ICD-10-CM | POA: Insufficient documentation

## 2013-10-08 DIAGNOSIS — I493 Ventricular premature depolarization: Secondary | ICD-10-CM

## 2013-10-08 DIAGNOSIS — I4949 Other premature depolarization: Secondary | ICD-10-CM | POA: Diagnosis present

## 2013-10-08 MED ORDER — TECHNETIUM TC 99M SESTAMIBI GENERIC - CARDIOLITE
30.0000 | Freq: Once | INTRAVENOUS | Status: AC | PRN
  Administered 2013-10-08: 30 via INTRAVENOUS

## 2013-10-08 MED ORDER — TECHNETIUM TC 99M SESTAMIBI GENERIC - CARDIOLITE
10.0000 | Freq: Once | INTRAVENOUS | Status: AC | PRN
  Administered 2013-10-08: 10 via INTRAVENOUS

## 2013-10-08 NOTE — Telephone Encounter (Signed)
Amy called in stating that Dr. Everlene Farrier would like for Dr. Debara Pickett to call him once the pt's stress test results come in. Please call  Thanks

## 2013-10-08 NOTE — Procedures (Addendum)
[place] [address] [place] [address] [phone]  Cardiology Nuclear Med Study  Seth Pope is a 63 y.o. male     MRN : [id number]     DOB: January 19, 1951  Procedure Date: 10/08/2013  Nuclear Med Background Indication for Stress Test:  Evaluation for Ischemia History:  No prior cardiac or respiratory history reported;No prior NUC MPI for comparison; Cardiac Risk Factors: Family History - CAD, History of Smoking, Hypertension, Lipids, NIDDM and Overweight  Symptoms:  Chest Pain, Dizziness, DOE, Fatigue, Light-Headedness, Palpitations and SOB   Nuclear Pre-Procedure Caffeine/Decaff Intake:  1:00am NPO After: 11am   IV Site: R Forearm  IV 0.9% NS with Angio Cath:  22g  Chest Size (in):  44"  IV Started by: Rolene Course, RN  Height: 5' 10.5" (1.791 m)  Cup Size: n/a  BMI:  Body mass index is 26.44 kg/(m^2). Weight:  187 lb (84.823 kg)   Tech Comments:  n/a    Nuclear Med Study 1 or 2 day study: 1 day  Stress Test Type:  Stress  Order Authorizing Provider:  Lyman Bishop, MD   Resting Radionuclide: Technetium 33m Sestamibi  Resting Radionuclide Dose: 10.9 mCi   Stress Radionuclide:  Technetium 79m Sestamibi  Stress Radionuclide Dose: 30.5 mCi           Stress Protocol Rest HR: 59 Stress HR: 137  Rest BP: 106/67 Stress BP: 165/78  Exercise Time (min): 9:17 METS: 10.30   Predicted Max HR: 159 bpm % Max HR: 86.16 bpm Rate Pressure Product: 22605  Dose of Adenosine (mg):  n/a Dose of Lexiscan: n/a mg  Dose of Atropine (mg): n/a Dose of Dobutamine: n/a mcg/kg/min (at max HR)  Stress Test Technologist: Mellody Memos, CCT Nuclear Technologist: Otho Perl, CNMT   Rest Procedure:  Myocardial perfusion imaging was performed at rest 45 minutes following the intravenous administration of Technetium 53m Sestamibi. Stress Procedure:  The patient performed treadmill exercise using a Bruce  Protocol for 9 minutes 17  seconds. The patient stopped due to chest pain. There were no significant ST-T wave changes.  Technetium 60m Sestamibi was injected IV at peak exercise and myocardial perfusion imaging was performed after a brief delay.  Transient Ischemic Dilatation (Normal <1.22):  0.97 QGS EDV:  96 ml QGS ESV:  39 ml LV Ejection Fraction: 60%        Rest ECG: Sinus bradycardia with no ST changes.  Stress ECG: No significant ST segment change suggestive of ischemia.  QPS Raw Data Images:  Acquisition technically good; normal left ventricular size. Stress Images:  There is decreased uptake in the inferior wall and apex. Rest Images:  There is decreased uptake in the inferior wall and apex. Subtraction (SDS):  No evidence of ischemia.  Impression Exercise Capacity:  Good exercise capacity. BP Response:  Hypotensive blood pressure response. Clinical Symptoms:  Patient complained of chest pain. ECG Impression:  No significant ST segment change suggestive of ischemia. Comparison with Prior Nuclear Study: No previous nuclear study performed  Overall Impression:  Low risk stress nuclear study with moderate size, medium intensity, fixed inferior and apical defects consistent with thinnig; no ischemia. Note patient did complain of chest pain with exercise and SBP decreased from 159 in stage 2 to 119 in stage 3.  LV Wall Motion:  NL LV Function; NL Wall Motion   [name], MD  10/08/2013 5:00 PM

## 2013-10-09 ENCOUNTER — Telehealth: Payer: Self-pay | Admitting: Internal Medicine

## 2013-10-09 ENCOUNTER — Encounter: Payer: Self-pay | Admitting: Cardiology

## 2013-10-09 NOTE — Telephone Encounter (Signed)
This encounter was created in error - please disregard.

## 2013-10-09 NOTE — Telephone Encounter (Signed)
Patient had stress test 10/08/13

## 2013-10-09 NOTE — Telephone Encounter (Signed)
Spoke with Dr. Everlene Farrier. He reports patient gets anxious and he would be best suited to have an earlier office visit with Dr. Debara Pickett to review stress test. Will consult with schedulers to see if a sooner appointment is available.

## 2013-10-09 NOTE — Telephone Encounter (Signed)
Patient now has appointment on Thursday September 3rd at 10am with Dr. Debara Pickett. Left patient a VM with this information. Asked that he call back to verify he got the message.

## 2013-10-09 NOTE — Telephone Encounter (Signed)
Spoke with patient. Provided preliminary results of stress test - low risk, no ischemia, concerns of CP and drop in BP. Communicated to patient that Dr. Everlene Farrier said he could reach out to him with questions/concerns regarding test. Patient advised that if he had increasing or more frequent CP to contact our office. Reminded patient of OV 9/28

## 2013-10-09 NOTE — Telephone Encounter (Signed)
Dr. Everlene Farrier would like to speak with Dr. Lysbeth Penner nurse.

## 2013-10-16 ENCOUNTER — Encounter: Payer: Self-pay | Admitting: *Deleted

## 2013-10-17 ENCOUNTER — Ambulatory Visit (INDEPENDENT_AMBULATORY_CARE_PROVIDER_SITE_OTHER): Payer: BC Managed Care – PPO | Admitting: Internal Medicine

## 2013-10-17 ENCOUNTER — Encounter: Payer: Self-pay | Admitting: Internal Medicine

## 2013-10-17 VITALS — BP 110/67 | HR 60 | Ht 70.0 in | Wt 186.2 lb

## 2013-10-17 DIAGNOSIS — E1110 Type 2 diabetes mellitus with ketoacidosis without coma: Secondary | ICD-10-CM

## 2013-10-17 DIAGNOSIS — I10 Essential (primary) hypertension: Secondary | ICD-10-CM

## 2013-10-17 DIAGNOSIS — I200 Unstable angina: Secondary | ICD-10-CM

## 2013-10-17 DIAGNOSIS — E1310 Other specified diabetes mellitus with ketoacidosis without coma: Secondary | ICD-10-CM

## 2013-10-17 DIAGNOSIS — F17200 Nicotine dependence, unspecified, uncomplicated: Secondary | ICD-10-CM

## 2013-10-17 DIAGNOSIS — I4949 Other premature depolarization: Secondary | ICD-10-CM

## 2013-10-17 DIAGNOSIS — Z720 Tobacco use: Secondary | ICD-10-CM | POA: Insufficient documentation

## 2013-10-17 DIAGNOSIS — E785 Hyperlipidemia, unspecified: Secondary | ICD-10-CM

## 2013-10-17 DIAGNOSIS — I493 Ventricular premature depolarization: Secondary | ICD-10-CM

## 2013-10-17 DIAGNOSIS — R0602 Shortness of breath: Secondary | ICD-10-CM | POA: Insufficient documentation

## 2013-10-17 NOTE — Progress Notes (Signed)
OFFICE NOTE  Chief Complaint:  Chest pain  Primary Care Physician: Seth Reichmann, MD  HPI:  Seth Pope is a pleasant 63 year old male has been referred to me by Dr. Everlene Farrier. His past medical history significant for hypertension, dyslipidemia, some anxiety, diabetes type 2 and a family history of heart disease. His father had a heart attack and died suddenly at age 80. His brother also has heart disease and recently had a stent. 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 He's had several episodes were his bed awakened in the morning with tightness in the chest. Also felt sometimes her chest and shortness of breath. He was recently on vacation for several days and even during that time had some episodes of chest discomfort. The symptoms are described as a pressure and deep inside his chest. He says the pain is minimal about 2-3/10 but definitely abnormal. He's been having this symptom first some time and has not told his wife but mentioned it to his primary doctor who advised him right away to have it evaluated.  Seth Pope returns today for followup. He underwent a nuclear stress test which was nonischemic that showed fixed inferoapical defects and mild thinning. There was no evidence for decreased EF and no evidence for wall motion abnormality in this area so scar is considered less likely. He could potentially have balanced ischemia, and did have a hypotensive response to exercise. Some of these symptoms seem somewhat anginal however others do not. He feels that he recently has been having more shortness of breath particularly in hot weather which precedes his chest discomfort but is able to exercise in cold weather without any problems. I wonder if his symptoms are related possibly to COPD. He does have a significant smoking history.  PMHx:  Past Medical History  Diagnosis Date  . Hypertension   . Diabetes mellitus    . Hyperlipidemia   . Chronic kidney disease     h/o kidney stone  . Ulcer   . Umbilical hernia     Past Surgical History  Procedure Laterality Date  . Knee surgery  1970  . Rotator cuff repair  1991    x2  . Umbilical hernia repair    . Back surgery    . Nm myocar perf wall motion  08/2010    bruce myoview - normal perfusion in all regions, EF 66%, EKG negative for ischemia, no wall motion abnormalities, normal/low risk study                                         FAMHx:  Family History  Problem Relation Age of Onset  . Diabetes Sister     HTN  . Diabetes Brother     HTN, heart problems  . Heart attack Father   . Hypertension Father   . Heart disease Father   . Hypertension    . Diabetes Paternal Grandmother   . Heart disease Paternal Grandfather     SOCHx:   reports that he quit smoking about 27 years ago. His smoking use included Cigarettes. He has a 45 pack-year smoking history. He does not have any smokeless tobacco history on file. He reports that he drinks alcohol. He reports that he does not use illicit drugs.  ALLERGIES:  Allergies  Allergen Reactions  .  Penicillins Rash    ROS: A comprehensive review of systems was negative except for: Respiratory: positive for dyspnea on exertion Cardiovascular: positive for exertional chest pressure/discomfort  HOME MEDS: Current Outpatient Prescriptions  Medication Sig Dispense Refill  . ALPRAZolam (XANAX XR) 1 MG 24 hr tablet Take 1 mg by mouth daily.      Marland Kitchen ALPRAZolam (XANAX) 0.25 MG tablet Take 1 mg by mouth at bedtime as needed.      Marland Kitchen aspirin 81 MG tablet Take 81 mg by mouth daily.      Marland Kitchen atorvastatin (LIPITOR) 80 MG tablet take 1 tablet by mouth once daily  30 tablet  2  . desipramine (NORPRAMIN) 50 MG tablet Take 150 mg by mouth at bedtime.       . fenofibrate 54 MG tablet Take 1 tablet (54 mg total) by mouth daily.  30 tablet  5  . gabapentin (NEURONTIN) 300 MG capsule take 1 capsule by mouth daily      .  lisinopril-hydrochlorothiazide (PRINZIDE,ZESTORETIC) 20-12.5 MG per tablet take 1 tablet by mouth once daily  30 tablet  2  . metFORMIN (GLUCOPHAGE-XR) 500 MG 24 hr tablet take 1 tablet by mouth twice a day  60 tablet  3  . metoprolol (TOPROL-XL) 200 MG 24 hr tablet take 1 tablet by mouth once daily  30 tablet  3  . nitroGLYCERIN (NITROSTAT) 0.4 MG SL tablet Place one under the tongue as needed for chest pain. Repeat in 5 minutes if no effect. Call 911 if you need to take the medication  30 tablet  3   No current facility-administered medications for this visit.    LABS/IMAGING: No results found for this or any previous visit (from the past 48 hour(s)). No results found.  VITALS: BP 110/67  Pulse 60  Ht 5\' 10"  (1.778 m)  Wt 186 lb 3.2 oz (84.46 kg)  BMI 26.72 kg/m2  EXAM: deferred  EKG: deferred  ASSESSMENT: 1. Chest pain concerning for unstable angina - low risk nuclear stress test 2. Abnormal EKG with PVCs 3. Hypertension 4. Dyslipidemia 5. Diabetes type 2 with diabetic neuropathy  PLAN: 1.   Seth Pope continues to have some symptoms of chest discomfort but it seemed to be preceded by shortness of breath. The symptoms are worse in hot weather with exertion and may be related to underlying lung disease. I would recommend pulmonary function tests given his significant smoking history. If this is abnormal that we make need to consider treatment with bronchodilator or steroid medication. I would also like to see him back in a month if he continues to have chest discomfort, we may need to consider cardiac catheterization. There are certainly some concerning symptoms or angina, abnormalities on his EKG including PVCs and a history of diabetes. He also has recently unexplained hypotension after having years of hypertension, so therefore even though his stress test was low risk the abnormalities and fixed defects that were noted could suggest some underlying coronary disease.  Pixie Casino, MD, [facility] Attending Cardiologist [facility]  Mikaya Bunner C 10/17/2013, 10:44 AM

## 2013-10-17 NOTE — Patient Instructions (Signed)
Dr Debara Pickett has recommended that you have a pulmonary function test. Pulmonary Function Tests are a group of tests that measure how well air moves in and out of your lungs.  Your physician recommends that you schedule a follow-up appointment in 1 month with Dr Debara Pickett.

## 2013-10-18 ENCOUNTER — Telehealth: Payer: Self-pay | Admitting: Internal Medicine

## 2013-10-18 ENCOUNTER — Telehealth: Payer: Self-pay

## 2013-10-18 ENCOUNTER — Other Ambulatory Visit: Payer: Self-pay | Admitting: Emergency Medicine

## 2013-10-18 ENCOUNTER — Telehealth: Payer: Self-pay | Admitting: Emergency Medicine

## 2013-10-18 NOTE — Telephone Encounter (Signed)
Patient states he received a call from our office but not sure what it is regarding. Patient stated he had a stress test with Dr. Ashok Croon 3 weeks ago and feels it is regarding it. Per patient when he had his stress test his blood pressure dropped quite a bit. He stated Dr. Ashok Croon recommended  him to have a  breathing test. Patients call back number is [phone]

## 2013-10-18 NOTE — Telephone Encounter (Signed)
I have called and his pulmonary function test can be moved up, he will have it on Tuesday at [facility] (arrive to admitting at 11:30) Sept 8th at [facility]. I have left message for patient to advise,and have asked him to call me back when he gets the message. Dr Everlene Farrier has indicated he will let patient know also.

## 2013-10-18 NOTE — Telephone Encounter (Signed)
Dr. Everlene Farrier called to request phone and pager numbers for Dr. Debara Pickett. He wants to discuss this patient with him. Numbers provided.

## 2013-10-18 NOTE — Telephone Encounter (Signed)
I called and spoke with the patient. I also spoke with Dr. Debara Pickett. The plan is to have pulmonary function testing done. Consideration to be made for coronary angiography after this testing is done. Patient agrees to avoid heavy exertion take nitroglycerin as needed and proceed to the emergency room if he were to have chest discomfort.

## 2013-10-18 NOTE — Telephone Encounter (Signed)
See other phone messages.

## 2013-10-22 ENCOUNTER — Encounter: Payer: Self-pay | Admitting: *Deleted

## 2013-10-22 ENCOUNTER — Ambulatory Visit (HOSPITAL_COMMUNITY)
Admission: RE | Admit: 2013-10-22 | Discharge: 2013-10-22 | Disposition: A | Discharge status: Home / Self Care | Payer: BC Managed Care – PPO | Source: Ambulatory Visit | Attending: Internal Medicine | Admitting: Internal Medicine

## 2013-10-22 DIAGNOSIS — J988 Other specified respiratory disorders: Secondary | ICD-10-CM | POA: Insufficient documentation

## 2013-10-22 DIAGNOSIS — F17200 Nicotine dependence, unspecified, uncomplicated: Secondary | ICD-10-CM | POA: Insufficient documentation

## 2013-10-22 DIAGNOSIS — R0602 Shortness of breath: Secondary | ICD-10-CM | POA: Diagnosis not present

## 2013-10-22 LAB — PULMONARY FUNCTION TEST
DL/VA % pred: 105 %
DL/VA: 4.87 ml/min/mmHg/L
DLCO cor % pred: 87 %
DLCO cor: 28.27 ml/min/mmHg
DLCO unc % pred: 87 %
DLCO unc: 28.27 ml/min/mmHg
FEF 25-75 Post: 2.94 L/sec
FEF 25-75 Pre: 2.23 L/sec
FEF2575-%Change-Post: 31 %
FEF2575-%Pred-Post: 100 %
FEF2575-%Pred-Pre: 76 %
FEV1-%Change-Post: 7 %
FEV1-%Pred-Post: 82 %
FEV1-%Pred-Pre: 77 %
FEV1-Post: 2.95 L
FEV1-Pre: 2.75 L
FEV1FVC-%Change-Post: 3 %
FEV1FVC-%Pred-Pre: 99 %
FEV6-%Change-Post: 4 %
FEV6-%Pred-Post: 84 %
FEV6-%Pred-Pre: 80 %
FEV6-Post: 3.8 L
FEV6-Pre: 3.64 L
FEV6FVC-%Change-Post: 0 %
FEV6FVC-%Pred-Post: 104 %
FEV6FVC-%Pred-Pre: 104 %
FVC-%Change-Post: 3 %
FVC-%Pred-Post: 80 %
FVC-%Pred-Pre: 77 %
FVC-Post: 3.82 L
FVC-Pre: 3.68 L
Post FEV1/FVC ratio: 77 %
Post FEV6/FVC ratio: 100 %
Pre FEV1/FVC ratio: 75 %
Pre FEV6/FVC Ratio: 99 %
RV % pred: 109 %
RV: 2.48 L
TLC % pred: 84 %
TLC: 5.92 L

## 2013-10-22 MED ORDER — ALBUTEROL SULFATE (2.5 MG/3ML) 0.083% IN NEBU
2.5000 mg | INHALATION_SOLUTION | Freq: Once | RESPIRATORY_TRACT | Status: AC
  Administered 2013-10-22: 2.5 mg via RESPIRATORY_TRACT

## 2013-10-22 NOTE — Telephone Encounter (Signed)
Opened in error

## 2013-10-23 ENCOUNTER — Ambulatory Visit (INDEPENDENT_AMBULATORY_CARE_PROVIDER_SITE_OTHER): Payer: BC Managed Care – PPO | Admitting: Emergency Medicine

## 2013-10-23 ENCOUNTER — Telehealth: Payer: Self-pay | Admitting: Internal Medicine

## 2013-10-23 ENCOUNTER — Emergency Department (HOSPITAL_COMMUNITY): Payer: BC Managed Care – PPO

## 2013-10-23 ENCOUNTER — Telehealth: Payer: Self-pay | Admitting: *Deleted

## 2013-10-23 ENCOUNTER — Observation Stay (HOSPITAL_COMMUNITY)
Admission: EM | Admit: 2013-10-23 | Discharge: 2013-10-24 | Disposition: A | Discharge status: Home / Self Care | Payer: BC Managed Care – PPO | Attending: Internal Medicine | Admitting: Internal Medicine

## 2013-10-23 ENCOUNTER — Encounter (HOSPITAL_COMMUNITY): Payer: Self-pay | Admitting: Emergency Medicine

## 2013-10-23 ENCOUNTER — Encounter (HOSPITAL_COMMUNITY)
Admission: EM | Disposition: A | Discharge status: Home / Self Care | Payer: Self-pay | Source: Home / Self Care | Attending: Emergency Medicine

## 2013-10-23 VITALS — BP 118/70 | HR 83 | Temp 97.9°F | Resp 16

## 2013-10-23 DIAGNOSIS — E785 Hyperlipidemia, unspecified: Secondary | ICD-10-CM | POA: Diagnosis not present

## 2013-10-23 DIAGNOSIS — R079 Chest pain, unspecified: Principal | ICD-10-CM | POA: Diagnosis present

## 2013-10-23 DIAGNOSIS — R0602 Shortness of breath: Secondary | ICD-10-CM | POA: Diagnosis present

## 2013-10-23 DIAGNOSIS — E1142 Type 2 diabetes mellitus with diabetic polyneuropathy: Secondary | ICD-10-CM | POA: Insufficient documentation

## 2013-10-23 DIAGNOSIS — I4949 Other premature depolarization: Secondary | ICD-10-CM | POA: Diagnosis not present

## 2013-10-23 DIAGNOSIS — I493 Ventricular premature depolarization: Secondary | ICD-10-CM | POA: Diagnosis present

## 2013-10-23 DIAGNOSIS — I498 Other specified cardiac arrhythmias: Secondary | ICD-10-CM | POA: Diagnosis not present

## 2013-10-23 DIAGNOSIS — Z794 Long term (current) use of insulin: Secondary | ICD-10-CM | POA: Insufficient documentation

## 2013-10-23 DIAGNOSIS — I10 Essential (primary) hypertension: Secondary | ICD-10-CM | POA: Diagnosis not present

## 2013-10-23 DIAGNOSIS — I152 Hypertension secondary to endocrine disorders: Secondary | ICD-10-CM | POA: Diagnosis present

## 2013-10-23 DIAGNOSIS — I2510 Atherosclerotic heart disease of native coronary artery without angina pectoris: Secondary | ICD-10-CM | POA: Diagnosis not present

## 2013-10-23 DIAGNOSIS — E1149 Type 2 diabetes mellitus with other diabetic neurological complication: Secondary | ICD-10-CM | POA: Diagnosis not present

## 2013-10-23 DIAGNOSIS — D689 Coagulation defect, unspecified: Secondary | ICD-10-CM

## 2013-10-23 DIAGNOSIS — Z8249 Family history of ischemic heart disease and other diseases of the circulatory system: Secondary | ICD-10-CM

## 2013-10-23 DIAGNOSIS — Z7982 Long term (current) use of aspirin: Secondary | ICD-10-CM | POA: Insufficient documentation

## 2013-10-23 DIAGNOSIS — Z01818 Encounter for other preprocedural examination: Secondary | ICD-10-CM

## 2013-10-23 DIAGNOSIS — Z87891 Personal history of nicotine dependence: Secondary | ICD-10-CM | POA: Insufficient documentation

## 2013-10-23 DIAGNOSIS — E119 Type 2 diabetes mellitus without complications: Secondary | ICD-10-CM

## 2013-10-23 DIAGNOSIS — E1169 Type 2 diabetes mellitus with other specified complication: Secondary | ICD-10-CM | POA: Diagnosis present

## 2013-10-23 DIAGNOSIS — Z720 Tobacco use: Secondary | ICD-10-CM | POA: Diagnosis present

## 2013-10-23 DIAGNOSIS — F411 Generalized anxiety disorder: Secondary | ICD-10-CM | POA: Diagnosis not present

## 2013-10-23 DIAGNOSIS — R0609 Other forms of dyspnea: Secondary | ICD-10-CM

## 2013-10-23 DIAGNOSIS — Z79899 Other long term (current) drug therapy: Secondary | ICD-10-CM | POA: Diagnosis not present

## 2013-10-23 DIAGNOSIS — R5381 Other malaise: Secondary | ICD-10-CM

## 2013-10-23 DIAGNOSIS — E1140 Type 2 diabetes mellitus with diabetic neuropathy, unspecified: Secondary | ICD-10-CM | POA: Diagnosis present

## 2013-10-23 DIAGNOSIS — I200 Unstable angina: Secondary | ICD-10-CM | POA: Diagnosis present

## 2013-10-23 DIAGNOSIS — R5383 Other fatigue: Secondary | ICD-10-CM

## 2013-10-23 DIAGNOSIS — R0600 Dyspnea, unspecified: Secondary | ICD-10-CM

## 2013-10-23 DIAGNOSIS — E1159 Type 2 diabetes mellitus with other circulatory complications: Secondary | ICD-10-CM | POA: Diagnosis present

## 2013-10-23 HISTORY — PX: LEFT HEART CATHETERIZATION WITH CORONARY ANGIOGRAM: SHX5451

## 2013-10-23 HISTORY — DX: Atherosclerotic heart disease of native coronary artery without angina pectoris: I25.10

## 2013-10-23 HISTORY — DX: Bradycardia, unspecified: R00.1

## 2013-10-23 LAB — BASIC METABOLIC PANEL
Anion gap: 13 (ref 5–15)
BUN: 19 mg/dL (ref 6–23)
CO2: 22 mEq/L (ref 19–32)
Calcium: 9.1 mg/dL (ref 8.4–10.5)
Chloride: 103 mEq/L (ref 96–112)
Creatinine, Ser: 1.09 mg/dL (ref 0.50–1.35)
GFR calc Af Amer: 83 mL/min — ABNORMAL LOW (ref >90)
GFR calc non Af Amer: 71 mL/min — ABNORMAL LOW (ref >90)
Glucose, Bld: 99 mg/dL (ref 70–99)
Potassium: 4.3 mEq/L (ref 3.7–5.3)
Sodium: 138 mEq/L (ref 137–147)

## 2013-10-23 LAB — I-STAT CHEM 8, ED
BUN: 18 mg/dL (ref 6–23)
Calcium, Ion: 1.19 mmol/L (ref 1.13–1.30)
Chloride: 104 mEq/L (ref 96–112)
Creatinine, Ser: 1.1 mg/dL (ref 0.50–1.35)
Glucose, Bld: 99 mg/dL (ref 70–99)
HCT: 41 % (ref 39.0–52.0)
Hemoglobin: 13.9 g/dL (ref 13.0–17.0)
Potassium: 4 mEq/L (ref 3.7–5.3)
Sodium: 138 mEq/L (ref 137–147)
TCO2: 24 mmol/L (ref 0–100)

## 2013-10-23 LAB — PROTIME-INR
INR: 1.09 (ref 0.00–1.49)
Prothrombin Time: 14.1 seconds (ref 11.6–15.2)

## 2013-10-23 LAB — CBC
HCT: 40.5 % (ref 39.0–52.0)
Hemoglobin: 14 g/dL (ref 13.0–17.0)
MCH: 29.5 pg (ref 26.0–34.0)
MCHC: 34.6 g/dL (ref 30.0–36.0)
MCV: 85.3 fL (ref 78.0–100.0)
Platelets: 304 10*3/uL (ref 150–400)
RBC: 4.75 MIL/uL (ref 4.22–5.81)
RDW: 13.3 % (ref 11.5–15.5)
WBC: 8.2 10*3/uL (ref 4.0–10.5)

## 2013-10-23 LAB — TROPONIN I: Troponin I: <0.3 ng/mL (ref <0.30)

## 2013-10-23 SURGERY — LEFT HEART CATHETERIZATION WITH CORONARY ANGIOGRAM
Anesthesia: LOCAL

## 2013-10-23 MED ORDER — METOPROLOL SUCCINATE ER 100 MG PO TB24: 200.0000 mg | ORAL_TABLET | Freq: Every day | ORAL | Status: DC

## 2013-10-23 MED ORDER — ALPRAZOLAM 0.25 MG PO TABS: 0.2500 mg | ORAL_TABLET | Freq: Two times a day (BID) | ORAL | Status: DC | PRN

## 2013-10-23 MED ORDER — LISINOPRIL 20 MG PO TABS
20.0000 mg | ORAL_TABLET | Freq: Every day | ORAL | Status: DC
Start: 2013-10-24 — End: 2013-10-24
  Administered 2013-10-24: 20 mg via ORAL
  Filled 2013-10-23: qty 1

## 2013-10-23 MED ORDER — ATORVASTATIN CALCIUM 80 MG PO TABS
80.0000 mg | ORAL_TABLET | Freq: Every morning | ORAL | Status: DC
  Administered 2013-10-24: 80 mg via ORAL
  Filled 2013-10-23: qty 1

## 2013-10-23 MED ORDER — ALPRAZOLAM 0.5 MG PO TABS: 0.5000 mg | ORAL_TABLET | Freq: Two times a day (BID) | ORAL | Status: DC | PRN

## 2013-10-23 MED ORDER — ASPIRIN 81 MG PO CHEW
324.0000 mg | CHEWABLE_TABLET | Freq: Once | ORAL | Status: AC
  Administered 2013-10-23: 324 mg via ORAL

## 2013-10-23 MED ORDER — MIDAZOLAM HCL 2 MG/2ML IJ SOLN
INTRAMUSCULAR | Status: AC
  Filled 2013-10-23: qty 2

## 2013-10-23 MED ORDER — METOPROLOL SUCCINATE ER 100 MG PO TB24
200.0000 mg | ORAL_TABLET | Freq: Every day | ORAL | Status: DC
  Filled 2013-10-23: qty 2

## 2013-10-23 MED ORDER — DESIPRAMINE HCL 50 MG PO TABS
150.0000 mg | ORAL_TABLET | Freq: Every day | ORAL | Status: DC
  Administered 2013-10-23: 150 mg via ORAL
  Filled 2013-10-23 (×3): qty 3

## 2013-10-23 MED ORDER — HEPARIN SODIUM (PORCINE) 1000 UNIT/ML IJ SOLN
INTRAMUSCULAR | Status: AC
  Filled 2013-10-23: qty 1

## 2013-10-23 MED ORDER — ACETAMINOPHEN 325 MG PO TABS
650.0000 mg | ORAL_TABLET | ORAL | Status: DC | PRN
Start: 2013-10-23 — End: 2013-10-24

## 2013-10-23 MED ORDER — INSULIN ASPART 100 UNIT/ML ~~LOC~~ SOLN: 0.0000 [IU] | Freq: Every day | SUBCUTANEOUS | Status: DC

## 2013-10-23 MED ORDER — IBUPROFEN 400 MG PO TABS
400.0000 mg | ORAL_TABLET | Freq: Four times a day (QID) | ORAL | Status: DC | PRN
  Filled 2013-10-23: qty 1

## 2013-10-23 MED ORDER — SODIUM CHLORIDE 0.9 % IV SOLN: INTRAVENOUS | Status: AC

## 2013-10-23 MED ORDER — NITROGLYCERIN 0.4 MG SL SUBL: 0.4000 mg | SUBLINGUAL_TABLET | SUBLINGUAL | Status: DC | PRN

## 2013-10-23 MED ORDER — LISINOPRIL-HYDROCHLOROTHIAZIDE 20-12.5 MG PO TABS: 1.0000 | ORAL_TABLET | Freq: Every day | ORAL | Status: DC

## 2013-10-23 MED ORDER — HYDROCHLOROTHIAZIDE 12.5 MG PO CAPS
12.5000 mg | ORAL_CAPSULE | Freq: Every day | ORAL | Status: DC
  Administered 2013-10-24: 12.5 mg via ORAL
  Filled 2013-10-23: qty 1

## 2013-10-23 MED ORDER — VERAPAMIL HCL 2.5 MG/ML IV SOLN
INTRAVENOUS | Status: AC
  Filled 2013-10-23: qty 2

## 2013-10-23 MED ORDER — FENTANYL CITRATE 0.05 MG/ML IJ SOLN
INTRAMUSCULAR | Status: AC
  Filled 2013-10-23: qty 2

## 2013-10-23 MED ORDER — IBUPROFEN 400 MG PO TABS
400.0000 mg | ORAL_TABLET | Freq: Three times a day (TID) | ORAL | Status: DC
  Administered 2013-10-23 – 2013-10-24 (×2): 400 mg via ORAL
  Filled 2013-10-23 (×4): qty 1

## 2013-10-23 MED ORDER — ZOLPIDEM TARTRATE 5 MG PO TABS: 5.0000 mg | ORAL_TABLET | Freq: Every evening | ORAL | Status: DC | PRN

## 2013-10-23 MED ORDER — LIDOCAINE HCL (PF) 1 % IJ SOLN
INTRAMUSCULAR | Status: AC
  Filled 2013-10-23: qty 30

## 2013-10-23 MED ORDER — FENOFIBRATE 54 MG PO TABS
54.0000 mg | ORAL_TABLET | Freq: Every day | ORAL | Status: DC
  Administered 2013-10-24: 54 mg via ORAL
  Filled 2013-10-23: qty 1

## 2013-10-23 MED ORDER — GABAPENTIN 300 MG PO CAPS
300.0000 mg | ORAL_CAPSULE | Freq: Every morning | ORAL | Status: DC
  Administered 2013-10-24: 300 mg via ORAL
  Filled 2013-10-23: qty 1

## 2013-10-23 MED ORDER — HEPARIN (PORCINE) IN NACL 2-0.9 UNIT/ML-% IJ SOLN
INTRAMUSCULAR | Status: AC
  Filled 2013-10-23: qty 1000

## 2013-10-23 MED ORDER — ONDANSETRON HCL 4 MG/2ML IJ SOLN: 4.0000 mg | Freq: Four times a day (QID) | INTRAMUSCULAR | Status: DC | PRN

## 2013-10-23 MED ORDER — INSULIN ASPART 100 UNIT/ML ~~LOC~~ SOLN: 0.0000 [IU] | Freq: Three times a day (TID) | SUBCUTANEOUS | Status: DC

## 2013-10-23 MED ORDER — PANTOPRAZOLE SODIUM 40 MG PO TBEC
40.0000 mg | DELAYED_RELEASE_TABLET | Freq: Every day | ORAL | Status: DC
  Administered 2013-10-23 – 2013-10-24 (×2): 40 mg via ORAL
  Filled 2013-10-23 (×2): qty 1

## 2013-10-23 NOTE — Telephone Encounter (Signed)
Message copied by Fidel Levy on Wed Oct 23, 2013  9:30 AM ------      Message from: Pixie Casino      Created: Tue Oct 22, 2013  3:31 PM       Looks like minimal obstructive disease - final interpretation pending. I suspect if he is still having chest pain, then he may need a definitive cardiac catheterization. I have discussed this with Dr. Everlene Farrier.            Dr. Lemmie Evens ------

## 2013-10-23 NOTE — ED Notes (Signed)
Per [facility], lives at home. Intermittent chest pain x6 weeks. Pt has had previous tests to figure out cause. All negative. Sent here by PCP to evaluate patient. Pt has scheduled cath, unsure of when. Pt is AAOX4, in NAD. Currently 1/10 CP.

## 2013-10-23 NOTE — H&P (Signed)
Patient ID: Seth Pope MRN: [id number], DOB/AGE: 1951-02-06   Admit date: 10/23/2013   Primary Physician: Jenny Reichmann, MD Primary Cardiologist: Dr Debara Pickett  HPI:  Seth Pope is a pleasant 63 year old married male has been referred to Dr Debara Pickett by Dr. Everlene Farrier. His past medical history significant for hypertension, dyslipidemia, some anxiety, diabetes type 2 with neuropathy and a family history of heart disease. His father had a heart attack and died suddenly at age 24. His brother also has heart disease and recently had a stent. 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 He's had several episodes were he awakened in the morning with tightness in the chest. He also has had shortness of breath. He was recently on vacation for several days and during that time he had some episodes of chest discomfort. The symptoms are described as a pressure and deep inside his chest. He says the pain is minimal about 2-3/10 but definitely abnormal. He's been having this symptom first some time and has not told his wife but mentioned it to his primary doctor who advised him right away to have it evaluated. He was seen by Dr Debara Pickett and set up for an exercise Myoview- done 10/08/13.  The stress test was nonischemic. It showed fixed inferoapical defects and mild thinning. There was no evidence for decreased EF and no evidence for wall motion abnormality in this area so scar is considered less likely. Dr Debara Pickett felt he could potentially have balanced ischemia, and the patient did have a hypotensive response to exercise. Some of these symptoms seem somewhat anginal however others do not. He feels that he recently has been having more shortness of breath particularly in hot weather which precedes his chest discomfort but is able to exercise in cold weather without any problems. Dr Debara Pickett discussed these findings with Dr Everlene Farrier. Today he was having some chest  pain and called Dr Everlene Farrier who told the pt to come by the office. The pt has not eaten today. He admitted to some chest pain to Dr Everlene Farrier who sent him to the ER by EMS for further evaluation. He is currently pain free.     Problem List: Past Medical History  Diagnosis Date  . Hypertension   . Diabetes mellitus   . Hyperlipidemia   . Chronic kidney disease     h/o kidney stone  . Ulcer   . Umbilical hernia     Past Surgical History  Procedure Laterality Date  . Knee surgery  1970  . Rotator cuff repair  1991    x2  . Umbilical hernia repair    . Back surgery    . Nm myocar perf wall motion  08/2010    bruce myoview - normal perfusion in all regions, EF 66%, EKG negative for ischemia, no wall motion abnormalities, normal/low risk study                                          Allergies:  Allergies  Allergen Reactions  . Penicillins Rash     Home Medications Current Facility-Administered Medications  Medication Dose Route Frequency Provider Last Rate Last Dose  . ALPRAZolam Duanne Moron) tablet 0.25 mg  0.25 mg Oral BID PRN Doreene Burke Richelle Glick, PA-C      . insulin aspart (novoLOG) injection 0-5  Units  0-5 Units Subcutaneous QHS Dajae Kizer K Fifi Schindler, PA-C      . insulin aspart (novoLOG) injection 0-9 Units  0-9 Units Subcutaneous TID WC Suzana Sohail K Minetta Krisher, PA-C      . zolpidem (AMBIEN) tablet 5 mg  5 mg Oral QHS PRN,Seth X 1 Erlene Quan, [place]       Current Outpatient Prescriptions  Medication Sig Dispense Refill  . ALPRAZolam (XANAX XR) 1 MG 24 hr tablet Take 1 mg by mouth daily.      Marland Kitchen ALPRAZolam (XANAX) 0.25 MG tablet Take 1 mg by mouth at bedtime as needed for anxiety.       Marland Kitchen aspirin 81 MG tablet Take 81 mg by mouth every morning.       Marland Kitchen atorvastatin (LIPITOR) 80 MG tablet Take 80 mg by mouth every morning.       . desipramine (NORPRAMIN) 50 MG tablet Take 150 mg by mouth at bedtime.       . fenofibrate 54 MG tablet Take 1 tablet (54 mg total) by mouth daily.  30 tablet  5  . gabapentin  (NEURONTIN) 300 MG capsule take 1 capsule by mouth daily      . ibuprofen (ADVIL,MOTRIN) 200 MG tablet Take 400 mg by mouth every 6 (six) hours as needed for headache.      . lisinopril-hydrochlorothiazide (PRINZIDE,ZESTORETIC) 20-12.5 MG per tablet take 1 tablet by mouth once daily  30 tablet  3  . metFORMIN (GLUCOPHAGE-XR) 500 MG 24 hr tablet take 1 tablet by mouth twice a day  60 tablet  3  . metoprolol (TOPROL-XL) 200 MG 24 hr tablet take 1 tablet by mouth once daily  30 tablet  3  . metoprolol (TOPROL-XL) 200 MG 24 hr tablet Take 200 mg by mouth daily.      . nitroGLYCERIN (NITROSTAT) 0.4 MG SL tablet Place one under the tongue as needed for chest pain. Repeat in 5 minutes if no effect. Call 911 if you need to take the medication  30 tablet  3     Family History  Problem Relation Age of Onset  . Diabetes Sister     HTN  . Diabetes Brother     HTN, heart problems  . Heart attack Father   . Hypertension Father   . Heart disease Father   . Hypertension    . Diabetes Paternal Grandmother   . Heart disease Paternal Grandfather      History   Social History  . Marital Status: Married    Spouse Name: N/A    Number of Children: 2  . Years of Education: 10th    Occupational History  . self-employed (Investment banker, operational)    Social History Main Topics  . Smoking status: Former Smoker -- 2.50 packs/day for 18 years    Types: Cigarettes    Quit date: 07/16/1986  . Smokeless tobacco: Not on file  . Alcohol Use: Yes     Comment: social  . Drug Use: No     Comment: former   . Sexual Activity: Not on file   Other Topics Concern  . Not on file   Social History Narrative  . No narrative on file     Review of Systems: General: negative for chills, fever, night sweats or weight changes.  Cardiovascular: negative for edema, orthopnea, palpitations, paroxysmal nocturnal dyspnea  Dermatological: negative for rash Respiratory: negative for cough or wheezing Urologic: negative  for hematuria Abdominal: negative for nausea, vomiting, diarrhea, bright red  blood per rectum, melena, or hematemesis Neurologic: negative for visual changes, syncope, or dizziness Pt has diabetic neuropathy All other systems reviewed and are otherwise negative except as noted above.  Physical Exam: Blood pressure 102/67, pulse 70, temperature 98.1 F (36.7 C), resp. rate 18, SpO2 96.00%.  General appearance: alert, cooperative and no distress Neck: no carotid bruit and no JVD Lungs: clear to auscultation bilaterally Heart: regular rate and rhythm Abdomen: soft, non-tender; bowel sounds normal; no masses,  no organomegaly Extremities: extremities normal, atraumatic, no cyanosis or edema Pulses: 2+ and symmetric Skin: Skin color, texture, turgor normal. No rashes or lesions Neurologic: Grossly normal    Labs:  No results found for this or any previous visit (from the past 24 hour(s)).   Radiology/Studies: No results found.  EKG: NSR without acute changes  ASSESSMENT AND PLAN:  Principal Problem:   Unstable angina Active Problems:   Shortness of breath   Hypertension   Diabetes mellitus   Hyperlipidemia   Neuropathy, diabetic   PVC's (premature ventricular contractions)   Tobacco abuse   Family history of coronary artery disease in father   PLAN: Discussed with Dr Mariane Masters today. He has received ASA by EMS.    Henri Medal, PA-C 10/23/2013, 3:48 PM

## 2013-10-23 NOTE — CV Procedure (Signed)
    Cardiac Catheterization Procedure Note  Name: [name] MRN: [id number] DOB: 18-Nov-1950  Procedure: Left Heart Cath, Selective Coronary Angiography, LV angiography  Indication: 63 yo WM with multiple risk factors presents with acute chest pain.   Procedural Details: The right wrist was prepped, draped, and anesthetized with 1% lidocaine. Using the modified Seldinger technique, a 6 French slender sheath was introduced into the right radial artery. 3 mg of verapamil was administered through the sheath, weight-based unfractionated heparin was administered intravenously. Standard Judkins catheters were used for selective coronary angiography and left ventriculography. Catheter exchanges were performed over an exchange length guidewire. There were no immediate procedural complications. A TR band was used for radial hemostasis at the completion of the procedure.  The patient was transferred to the post catheterization recovery area for further monitoring.  Procedural Findings: Hemodynamics: AO 118/70 mean 91 mm Hg LV 114/16 mm Hg  Coronary angiography: Coronary dominance: codominant  Left mainstem: normal  Left anterior descending (LAD): 30% segmental disease in the proximal vessel.  Left circumflex (LCx): Large codominant vessel. Normal.  Right coronary artery (RCA): Normal.  Left ventriculography: Left ventricular systolic function is normal, LVEF is estimated at 55-65%, there is no significant mitral regurgitation   Final Conclusions:   1. Nonobstructive CAD 2. Normal LV function.   Recommendations: Risk factor modification. Will treat him for musculoskeletal chest pain.  Peter Martinique, [place]  10/23/2013, 5:24 PM

## 2013-10-23 NOTE — Telephone Encounter (Signed)
Spoke to [place] at Dr. Lysbeth Penner office- she has left pt a message to call and work out an appointment time for his LHC. He may contact her at [phone] ext. [place]

## 2013-10-23 NOTE — ED Notes (Signed)
Given 1 nitro by EMS and 324 asa. Pressure initial 130/80, 110/60 after 1st nitro. No change in pain.

## 2013-10-23 NOTE — Telephone Encounter (Signed)
Patient seen at [facility] by Dr. Everlene Farrier today and sent to [facility] ER for chest pain and ? Cardiac cath.  Info sent to Dr. Debara Pickett.

## 2013-10-23 NOTE — Progress Notes (Signed)
   Subjective:    Patient ID: Seth Pope, male    DOB: 20-Sep-1950, 63 y.o.   MRN: [id number]  HPI patient states that ever since he had his pulmonary function tests done yesterday he has had intermittent episodes of pain in his chest. This is associated with shortness of breath. He did work through the morning but often onto the morning has had a substernal pressure sensation with shortness of breath. He has had no radiation of pain into his neck shoulders or jaw. He has had no nausea or vomiting. He has a history of a stress Cardiolite showing apical thinning associated with some hypotension during the testing but otherwise was felt to be a low risk study. Pulmonary function tests done yesterday showed mild lung disease but not significant. He presents today with this pain often onto the morning. He has a history of diabetes hyperlipidemia hypertension previous history of smoking and family history of coronary artery disease   Review of Systems     Objective:   Physical Exam patient is alert and conversive but does state he has chest discomfort Neck supple without adenopathy. Chest clear to auscultation and percussion. Heart regular rate and rhythm without murmurs rubs or gallops. There was no tenderness over the chest wall. Abdomen soft nontender. Extremities without cyanosis clubbing or edema.  EKG normal sinus no significant ST-T changes      Assessment & Plan:  Patient presents with chest pain. Patient deftly high risk for coronary disease. He is sent to the emergency room for troponin testing and management of his chest pain and consideration for early catheterization I spoke with Dr. Percival Spanish who is on-call for [facility]. Heart and vascular. I also left a message with Dr. Lysbeth Penner nurse. Patient given 4 baby aspirin prior to transfer .

## 2013-10-23 NOTE — H&P (Signed)
Pt. Seen and examined. Agree with the NP/PA-C note as written.  Refer to my office notes and those from Dr. Everlene Farrier. Recent normal stress test (however, hypotensive response to exercise and dyspnea) - recurrent chest pain and dyspnea. Relatively normal pulmonary function testing. No acute EKG changes, but persistent chest pain, worse today. Numerous cardiac risk factors. Will need definitive cardiac catheterization - plan today with ongoing chest pain.  Pixie Casino, MD, [facility] Attending Cardiologist [place]

## 2013-10-23 NOTE — Interval H&P Note (Signed)
History and Physical Interval Note:  10/23/2013 4:55 PM  March Lessley  has presented today for surgery, with the diagnosis of cp  The various methods of treatment have been discussed with the patient and family. After consideration of risks, benefits and other options for treatment, the patient has consented to  Procedure(s): LEFT HEART CATHETERIZATION WITH CORONARY ANGIOGRAM (N/A) as a surgical intervention .  The patient's history has been reviewed, patient examined, no change in status, stable for surgery.  I have reviewed the patient's chart and labs.  Questions were answered to the patient's satisfaction.   Cath Lab Visit (complete for each Cath Lab visit)  Clinical Evaluation Leading to the Procedure:   ACS: Yes.    Non-ACS:    Anginal Classification: CCS IV  Anti-ischemic medical therapy: No Therapy  Non-Invasive Test Results: Low-risk stress test findings: cardiac mortality <1%/year  Prior CABG: No previous CABG        Collier [facility] 10/23/2013 4:55 PM

## 2013-10-23 NOTE — ED Provider Notes (Signed)
CSN: [id number]     Arrival date & time 10/23/13  1511 History   First MD Initiated Contact with Patient 10/23/13 1513     Chief Complaint  Patient presents with  . Chest Pain     (Consider location/radiation/quality/duration/timing/severity/associated sxs/prior Treatment) HPI Comments: Intermittent CP for 6 weeks - worsened today - Dr. Everlene Farrier reviewed his EKG in the office and had him sent to the ED. Patient had recent stress test with drop in BP only. Recent PFTs done as well. Cards knows he's coming to the ED.  Patient is a 63 y.o. male presenting with chest pain. The history is provided by the patient.  Chest Pain Pain location:  L chest Pain quality: aching, dull and sharp   Pain radiates to:  Does not radiate Pain radiates to the back: no   Pain severity:  Moderate Onset quality:  Sudden Timing:  Intermittent Progression:  Worsening Chronicity:  Recurrent Context: movement and at rest   Context: not eating   Relieved by:  Rest Worsened by:  Nothing tried Associated symptoms: diaphoresis and shortness of breath   Associated symptoms: no back pain and no fever     Past Medical History  Diagnosis Date  . Hypertension   . Diabetes mellitus   . Hyperlipidemia   . Chronic kidney disease     h/o kidney stone  . Ulcer   . Umbilical hernia    Past Surgical History  Procedure Laterality Date  . Knee surgery  1970  . Rotator cuff repair  1991    x2  . Umbilical hernia repair    . Back surgery    . Nm myocar perf wall motion  08/2010    bruce myoview - normal perfusion in all regions, EF 66%, EKG negative for ischemia, no wall motion abnormalities, normal/low risk study                                        Family History  Problem Relation Age of Onset  . Diabetes Sister     HTN  . Diabetes Brother     HTN, heart problems  . Heart attack Father   . Hypertension Father   . Heart disease Father   . Hypertension    . Diabetes Paternal Grandmother   . Heart disease  Paternal Grandfather    History  Substance Use Topics  . Smoking status: Former Smoker -- 2.50 packs/day for 18 years    Types: Cigarettes    Quit date: 07/16/1986  . Smokeless tobacco: Not on file  . Alcohol Use: Yes     Comment: social    Review of Systems  Constitutional: Positive for diaphoresis. Negative for fever and chills.  Respiratory: Positive for shortness of breath.   Cardiovascular: Positive for chest pain. Negative for leg swelling.  Musculoskeletal: Negative for back pain.  All other systems reviewed and are negative.     Allergies  Penicillins  Home Medications   Prior to Admission medications   Medication Sig Start Date End Date Taking? Authorizing Provider  atorvastatin (LIPITOR) 80 MG tablet Take 80 mg by mouth daily at 6 PM.   Yes Historical Provider, MD  ALPRAZolam (XANAX XR) 1 MG 24 hr tablet Take 1 mg by mouth daily.    Historical Provider, MD  ALPRAZolam Duanne Moron) 0.25 MG tablet Take 1 mg by mouth at bedtime as needed. 05/15/12  Darlyne Russian, MD  aspirin 81 MG tablet Take 81 mg by mouth daily.    Historical Provider, MD  desipramine (NORPRAMIN) 50 MG tablet Take 150 mg by mouth at bedtime.     Historical Provider, MD  fenofibrate 54 MG tablet Take 1 tablet (54 mg total) by mouth daily. 05/30/13   Darlyne Russian, MD  gabapentin (NEURONTIN) 300 MG capsule take 1 capsule by mouth daily    Darlyne Russian, MD  lisinopril-hydrochlorothiazide (PRINZIDE,ZESTORETIC) 20-12.5 MG per tablet take 1 tablet by mouth once daily 10/18/13   Darlyne Russian, MD  metFORMIN (GLUCOPHAGE-XR) 500 MG 24 hr tablet take 1 tablet by mouth twice a day    Darlyne Russian, MD  metoprolol (TOPROL-XL) 200 MG 24 hr tablet take 1 tablet by mouth once daily 10/18/13   Darlyne Russian, MD  nitroGLYCERIN (NITROSTAT) 0.4 MG SL tablet Place one under the tongue as needed for chest pain. Repeat in 5 minutes if no effect. Call 911 if you need to take the medication 10/05/13   Darlyne Russian, MD   BP 102/67   Pulse 70  Temp(Src) 98.1 F (36.7 C)  Resp 18  SpO2 96% Physical Exam  Nursing note and vitals reviewed. Constitutional: He is oriented to person, place, and time. He appears well-developed and well-nourished. No distress.  HENT:  Head: Normocephalic and atraumatic.  Mouth/Throat: No oropharyngeal exudate.  Eyes: EOM are normal. Pupils are equal, round, and reactive to light.  Neck: Normal range of motion. Neck supple.  Cardiovascular: Normal rate and regular rhythm.  Exam reveals no friction rub.   No murmur heard. Pulmonary/Chest: Effort normal and breath sounds normal. No respiratory distress. He has no wheezes. He has no rales.  Abdominal: He exhibits no distension. There is no tenderness. There is no rebound.  Musculoskeletal: Normal range of motion. He exhibits no edema.  Neurological: He is alert and oriented to person, place, and time.  Skin: He is not diaphoretic.    ED Course  Procedures (including critical care time) Labs Review Labs Reviewed  CBC  BASIC METABOLIC PANEL  TROPONIN I    Imaging Review No results found.   EKG Interpretation None      Date: 10/23/2013  Rate: 56  Rhythm: sinus bradycardia  QRS Axis: normal  Intervals: normal  ST/T Wave abnormalities: ST elevations anteriorly and - minimal with anterior ST elevation - no reciprocal changes  Conduction Disutrbances:none  Narrative Interpretation:   Old EKG Reviewed: none available   MDM   Final diagnoses:  Chest pain, unspecified chest pain type    57M here with recurrent CP for past 6 weeks. Here for Cards admission. Cards PA entered the room upon my exit after my exam. EKG normal here. Labs sent. Prepare for admission.    Evelina Bucy, MD 10/23/13 1539

## 2013-10-23 NOTE — H&P (View-Only) (Signed)
   Subjective:    Patient ID: Seth Pope, male    DOB: 1950/07/03, 63 y.o.   MRN: [id number]  HPI patient states that ever since he had his pulmonary function tests done yesterday he has had intermittent episodes of pain in his chest. This is associated with shortness of breath. He did work through the morning but often onto the morning has had a substernal pressure sensation with shortness of breath. He has had no radiation of pain into his neck shoulders or jaw. He has had no nausea or vomiting. He has a history of a stress Cardiolite showing apical thinning associated with some hypotension during the testing but otherwise was felt to be a low risk study. Pulmonary function tests done yesterday showed mild lung disease but not significant. He presents today with this pain often onto the morning. He has a history of diabetes hyperlipidemia hypertension previous history of smoking and family history of coronary artery disease   Review of Systems     Objective:   Physical Exam patient is alert and conversive but does state he has chest discomfort Neck supple without adenopathy. Chest clear to auscultation and percussion. Heart regular rate and rhythm without murmurs rubs or gallops. There was no tenderness over the chest wall. Abdomen soft nontender. Extremities without cyanosis clubbing or edema.  EKG normal sinus no significant ST-T changes      Assessment & Plan:  Patient presents with chest pain. Patient deftly high risk for coronary disease. He is sent to the emergency room for troponin testing and management of his chest pain and consideration for early catheterization I spoke with Dr. Percival Spanish who is on-call for [facility]. Heart and vascular. I also left a message with Dr. Lysbeth Penner nurse. Patient given 4 baby aspirin prior to transfer .

## 2013-10-23 NOTE — Telephone Encounter (Signed)
Spoke with patient. Informed him of preliminary PFT results and that Dr. Debara Pickett and Dr. Everlene Farrier would like patient to have [place] with Dr. Debara Pickett. Patient is agreeable. He states she Googled a heart cath and is now fairly familiar with the procedure. Explained to him location of procedure, pre-procedure work up (labs, CXR and location) and informed him that scheduler would contact him to set up procedure date. Patient voiced understanding.

## 2013-10-24 ENCOUNTER — Other Ambulatory Visit: Payer: Self-pay | Admitting: Physician Assistant

## 2013-10-24 ENCOUNTER — Encounter (HOSPITAL_COMMUNITY): Payer: Self-pay | Admitting: Physician Assistant

## 2013-10-24 DIAGNOSIS — R0602 Shortness of breath: Secondary | ICD-10-CM | POA: Diagnosis not present

## 2013-10-24 DIAGNOSIS — I2510 Atherosclerotic heart disease of native coronary artery without angina pectoris: Secondary | ICD-10-CM | POA: Diagnosis not present

## 2013-10-24 DIAGNOSIS — R001 Bradycardia, unspecified: Secondary | ICD-10-CM

## 2013-10-24 DIAGNOSIS — R079 Chest pain, unspecified: Secondary | ICD-10-CM

## 2013-10-24 DIAGNOSIS — I10 Essential (primary) hypertension: Secondary | ICD-10-CM

## 2013-10-24 DIAGNOSIS — I498 Other specified cardiac arrhythmias: Secondary | ICD-10-CM | POA: Diagnosis not present

## 2013-10-24 DIAGNOSIS — Z8249 Family history of ischemic heart disease and other diseases of the circulatory system: Secondary | ICD-10-CM

## 2013-10-24 DIAGNOSIS — E1149 Type 2 diabetes mellitus with other diabetic neurological complication: Secondary | ICD-10-CM

## 2013-10-24 LAB — GLUCOSE, CAPILLARY: Glucose-Capillary: 96 mg/dL (ref 70–99)

## 2013-10-24 MED ORDER — HYDROCHLOROTHIAZIDE 12.5 MG PO CAPS: 12.5000 mg | ORAL_CAPSULE | Freq: Every day | ORAL | Status: DC

## 2013-10-24 MED ORDER — METFORMIN HCL ER 500 MG PO TB24: 500.0000 mg | ORAL_TABLET | Freq: Two times a day (BID) | ORAL | Status: DC

## 2013-10-24 MED ORDER — METOPROLOL TARTRATE 100 MG PO TABS: 100.0000 mg | ORAL_TABLET | Freq: Two times a day (BID) | ORAL | Status: DC

## 2013-10-24 MED ORDER — LISINOPRIL 40 MG PO TABS: 20.0000 mg | ORAL_TABLET | Freq: Every day | ORAL | Status: DC

## 2013-10-24 NOTE — Discharge Summary (Signed)
Discharge Summary   Patient ID: Seth Pope MRN: [id number], DOB/AGE: 05/10/1950 63 y.o. Admit date: 10/23/2013 D/C date:     10/24/2013  Primary Care Provider: Jenny Reichmann, MD Primary Cardiologist: Dr. Debara Pickett, MD   Primary Discharge Diagnoses:  1. Chest pain likely 2/2 MSK in etiology (cath 10/23/2013: nonobstructive CAD) 2. Significant sinus bradycardia, as low as 36 BPM, with multiple pauses, 2.1-2.3 seconds 3. HTN 4. HLD 5. DM2 with neuropathy   Secondary Discharge Diagnoses:  1. Remote tobacco abuse 2. H/o renal calculi 3. H/o ulcers 4. Anxiety  Hospital Course: 63 year old married sent to [facility] ED from [facility] with complaint of intermittent chest pain. His PMHx significant for HTN, HLD, DM2 with neuropathy, anxiety, and a family Hx of CAD. His father had an MI and died suddenly at age 59. His brother also has CAD and recently had a stent. 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   He was seen by Dr. Debara Pickett near the end of August 2/2 intermittent chest pain concerning for unstable angina. Exercise Myoview, 10/08/2013, that was nonischemic. It showed fixed inferior and apical defects and mild thinning. There was no evidence for decreased EF and no evidence for wall motion abnormality in this area, so scar is considered less likely. Dr. Debara Pickett felt he could potentially could have balanced ischemia and the patient did have a hypotensive response to exercise, along with dyspnea. It was felt that some of these symptoms may be related to anginal pain, but others not. Patient felt that he has been having more shortness of breath particularly in hot weather which precedes his chest discomfort but is able to exercise in cold weather without any problems. Had PFTs done 10/22/13 that revealed mild lung disease.   On 10/23/13 he went to [facility] with intermittent substernal chest pain since his PFT on 10/22/13 that was associated with SOB and  chest tightness. Pain was without radiation and characterized as pressure deep inside, 2-3/10. No nausea or vomiting. He has noticed several episodes where he was awakened in the morning with chest tightness, but did not seek medical advice. Upon arrival to [facility] he was evaluated by their staff and Dr. Everlene Farrier. His EKG there was read as NSR, no significant st/t changes (this is not available for reading). The decision was made to send him to the ER by EMS for further evaluation 2/2 high risk for CAD. Transition of care was made through Dr. Everlene Farrier - Dr. Percival Spanish - and Dr. Lysbeth Penner nurse.   Upon arrival to the ED he was pain free. He received ASA prior to arrival at the ED. Troponin negative x 1. EKG sinus bradycardia, 56, no acute st/t changes. Plan to perform definitive cardiac cath 2/2 h/o persistent chest pain.    Cardiac cath 10/23/13: nonobstructive CAD, normal LV function. Recommendations: risk factor modification. (see below for full report).   On telemetry he was found to have significant sinus bradycardia with rates as low as 36 BPM and pauses of 2.1-2.3 seconds. He will be set up with a Holter monitor as an outpatient to further assess this and his pre-hospitalization symptomology of fatigue, dyspnea, and "swimmy headedness." Given his bradycardia his Toprol-XL 200 mg was changed to Lopressor 100 mg bid and his lisinopril was increased from 20 mg to 40 mg to compensate for his HTN. He will otherwise remain on his current medications.    Consults: None  Discharge Vitals:  Blood pressure 131/67, pulse 56, temperature 98.3 F (36.8 C), temperature source Oral, resp. rate 19, height 5\' 10"  (1.778 m), weight 187 lb 6.3 oz (85 kg), SpO2 98.00%.  Labs: Lab Results  Component Value Date   WBC 8.2 10/23/2013   HGB 13.9 10/23/2013   HCT 41.0 10/23/2013   MCV 85.3 10/23/2013   PLT 304 10/23/2013    Recent Labs Lab 10/23/13 1541 10/23/13 1638  NA 138 138  K 4.3 4.0  CL 103 104  CO2 22  --   BUN 19 18    CREATININE 1.09 1.10  CALCIUM 9.1  --   GLUCOSE 99 99    Recent Labs  10/23/13 1541  TROPONINI <0.30   Lab Results  Component Value Date   CHOL 115 08/13/2013   HDL 38* 08/13/2013   LDLCALC 59 08/13/2013   TRIG 90 08/13/2013       Diagnostic Studies/Procedures   Dg Chest 2 View  10/23/2013   CLINICAL DATA:  Chest pain during and after pulmonary function tests, diabetes, former smoking history  EXAM: CHEST  2 VIEW  COMPARISON:  Chest x-Khyren of 01/01/2013  FINDINGS: No active infiltrate or effusion is seen. Mediastinal and hilar contours are unchanged. The heart is within upper limits of normal. There are degenerative changes in the mid to lower thoracic spine. Somewhat widened AC joints are present bilaterally but stable.  IMPRESSION: No active cardiopulmonary disease.   Electronically Signed   By: Ivar Drape M.D.   On: 10/23/2013 16:09   Cardiac Cath 10/23/2013:   Procedural Findings:  Hemodynamics:  AO 118/70 mean 91 mm Hg  LV 114/16 mm Hg  Coronary angiography:  Coronary dominance: codominant  Left mainstem: normal  Left anterior descending (LAD): 30% segmental disease in the proximal vessel.  Left circumflex (LCx): Large codominant vessel. Normal.  Right coronary artery (RCA): Normal.  Left ventriculography: Left ventricular systolic function is normal, LVEF is estimated at 55-65%, there is no significant mitral regurgitation  Final Conclusions:  1. Nonobstructive CAD  2. Normal LV function.  Recommendations: Risk factor modification. Will treat him for musculoskeletal chest pain.  Discharge Medications   Discharge Medication List as of 10/24/2013 10:45 AM    START taking these medications   Details  hydrochlorothiazide (MICROZIDE) 12.5 MG capsule Take 1 capsule (12.5 mg total) by mouth daily., Starting 10/24/2013, Until Discontinued, Normal    lisinopril (PRINIVIL,ZESTRIL) 40 MG tablet Take 0.5 tablets (20 mg total) by mouth daily., Starting 10/24/2013, Until  Discontinued, Normal    metoprolol (LOPRESSOR) 100 MG tablet Take 1 tablet (100 mg total) by mouth 2 (two) times daily., Starting 10/24/2013, Until Discontinued, Normal      CONTINUE these medications which have CHANGED   Details  metFORMIN (GLUCOPHAGE-XR) 500 MG 24 hr tablet Take 1 tablet (500 mg total) by mouth 2 (two) times daily. May restart on the evening of 10/25/2013., Starting 10/24/2013, Until Discontinued, Normal      CONTINUE these medications which have NOT CHANGED   Details  ALPRAZolam (XANAX XR) 1 MG 24 hr tablet Take 1 mg by mouth daily., Until Discontinued, Historical Med    ALPRAZolam (XANAX) 0.25 MG tablet Take 1 mg by mouth at bedtime as needed for anxiety. , Starting 05/15/2012, Until Discontinued, Historical Med    aspirin 81 MG tablet Take 81 mg by mouth every morning. , Until Discontinued, Historical Med    atorvastatin (LIPITOR) 80 MG tablet Take 80 mg by mouth every morning. , Until Discontinued, Historical  Med    desipramine (NORPRAMIN) 50 MG tablet Take 150 mg by mouth at bedtime. , Until Discontinued, Historical Med    fenofibrate 54 MG tablet Take 1 tablet (54 mg total) by mouth daily., Starting 05/30/2013, Until Discontinued, Normal    gabapentin (NEURONTIN) 300 MG capsule take 1 capsule by mouth daily, Historical Med    ibuprofen (ADVIL,MOTRIN) 200 MG tablet Take 400 mg by mouth every 6 (six) hours as needed for headache., Until Discontinued, Historical Med    nitroGLYCERIN (NITROSTAT) 0.4 MG SL tablet Place one under the tongue as needed for chest pain. Repeat in 5 minutes if no effect. Call 911 if you need to take the medication, Normal      STOP taking these medications     lisinopril-hydrochlorothiazide (PRINZIDE,ZESTORETIC) 20-12.5 MG per tablet      metoprolol (TOPROL-XL) 200 MG 24 hr tablet         Disposition   The patient will be discharged in stable condition to home. Discharge Instructions   Diet - low sodium heart healthy    Complete  by:  As directed      Increase activity slowly    Complete by:  As directed           Follow-up Information   Follow up with Tarri Fuller, PA-C On 11/13/2013. (APPT: 8 AM [facility]))    Specialty:  Physician Assistant   Contact information:   [address] [place] [address] [place] [address] [phone]       Follow up with Holter Monitor  On 10/28/2013. (APPT: 8:30 AM)    Contact information:   [address] [place], [place] [address] [phone]      Follow up with DAUB, Richardson Landry A, MD In 2 weeks. (Need to be seen at the walk-in center as hospital follow up appt cannot be made.)    Specialty:  Family Medicine   Contact information:   [place] [address] [phone]         Duration of Discharge Encounter: Greater than 30 minutes including physician and PA time.  Melvern Banker PA-C 10/24/2013, 12:39 PM

## 2013-10-24 NOTE — Progress Notes (Signed)
UR Completed Mechel Haggard Graves-Bigelow, RN,BSN [phone]  

## 2013-10-24 NOTE — Progress Notes (Signed)
Patient: Seth Pope / Admit Date: 10/23/2013 / Date of Encounter: 10/24/2013, 7:48 AM   Subjective: 1 episode of left sided chest pain this morning during venipuncture lasting a few seconds before self resolving. Another episode along the right chest lasting 1-2 seconds while I was interviewing him with associated right hand paresthesias. No palpitations, dizziness, SOB, or dyspnea.     Objective: Telemetry: sinus bradycardia, rates currently in the 50s, rates overnight into the 40s, frequent pauses measuring 2.1 sec-2.5 sec. Physical Exam: Blood pressure 131/67, pulse 56, temperature 98.3 F (36.8 C), temperature source Oral, resp. rate 19, SpO2 98.00%. General: Well developed, well nourished, in no acute distress. Head: Normocephalic, atraumatic, sclera non-icteric, no xanthomas, nares are without discharge. Neck: Negative for carotid bruits. JVP not elevated. Lungs: Clear bilaterally to auscultation without wheezes, rales, or rhonchi. Breathing is unlabored. Heart: RRR S1 S2 without murmurs, rubs, or gallops.  Abdomen: Soft, non-tender, non-distended with normoactive bowel sounds. No rebound/guarding. Extremities: No clubbing or cyanosis. No edema. Distal pedal pulses are 2+ and equal bilaterally. Neuro: Alert and oriented X 3. Moves all extremities spontaneously. Psych:  Responds to questions appropriately with a normal affect.  No intake or output data in the 24 hours ending 10/24/13 0748  Inpatient Medications:  . atorvastatin  80 mg Oral q morning - 10a  . desipramine  150 mg Oral QHS  . fenofibrate  54 mg Oral Daily  . gabapentin  300 mg Oral q morning - 10a  . hydrochlorothiazide  12.5 mg Oral Daily   And  . lisinopril  20 mg Oral Daily  . ibuprofen  400 mg Oral TID  . insulin aspart  0-5 Units Subcutaneous QHS  . insulin aspart  0-9 Units Subcutaneous TID WC  . metoprolol succinate  200 mg Oral Daily  . pantoprazole  40 mg Oral Daily   Infusions:    Labs:  Recent  Labs  10/23/13 1541 10/23/13 1638  NA 138 138  K 4.3 4.0  CL 103 104  CO2 22  --   GLUCOSE 99 99  BUN 19 18  CREATININE 1.09 1.10  CALCIUM 9.1  --     Recent Labs  10/23/13 1541 10/23/13 1638  WBC 8.2  --   HGB 14.0 13.9  HCT 40.5 41.0  MCV 85.3  --   PLT 304  --     Recent Labs  10/23/13 1541  TROPONINI <0.30   Radiology/Studies:  Dg Chest 2 View  10/23/2013   CLINICAL DATA:  Chest pain during and after pulmonary function tests, diabetes, former smoking history  EXAM: CHEST  2 VIEW  COMPARISON:  Chest x-Mrk of 01/01/2013  FINDINGS: No active infiltrate or effusion is seen. Mediastinal and hilar contours are unchanged. The heart is within upper limits of normal. There are degenerative changes in the mid to lower thoracic spine. Somewhat widened AC joints are present bilaterally but stable.  IMPRESSION: No active cardiopulmonary disease.   Electronically Signed   By: Ivar Drape M.D.   On: 10/23/2013 16:09   Cath 10/23/13:  Procedural Findings:  Hemodynamics:  AO 118/70 mean 91 mm Hg  LV 114/16 mm Hg  Coronary angiography:  Coronary dominance: codominant  Left mainstem: normal  Left anterior descending (LAD): 30% segmental disease in the proximal vessel.  Left circumflex (LCx): Large codominant vessel. Normal.  Right coronary artery (RCA): Normal.  Left ventriculography: Left ventricular systolic function is normal, LVEF is estimated at 55-65%, there is no significant mitral  regurgitation  Final Conclusions:  1. Nonobstructive CAD  2. Normal LV function.  Recommendations: Risk factor modification. Will treat him for musculoskeletal chest pain.    Assessment and Plan  63 y.o. male with h/o HTN, HLD, DM2 with neuropathy, and family hx of heart disease who presented to [facility] on 10/23/13 after having intermittent chest pain x 6 weeks.   1. Chest pain, likely 2/2 MSK: -Cardiac cath 10/23/13 with nonobstructive CAD and nl LV function, recommendations: risk factor  modification -TnI negative x 1   -Continue Lipitor 80 mg, Toprol-XL 200 mg, start aspirin 81 mg  -D/c today  2. Sinus pauses on telemetry with associated bradycardia: -Frequent pauses of 2.1 sec - 2.3 sec on tele -Set up outpatient monitor given the frequency and his intermittent SOB, fatigue, and dizziness all occuring at home -He not had any symptoms while inpatient other than chest pain -Consider decreasing Toprol-XL to 100 mg  3. HTN -Controlled -Continue current meds  4. HLD -TC 115, LDL 59, HDL 38, trigs 90 -Continue Lipitor 80 mg and fenofibrate 54 mg  5. DM2 -Controlled -On SSI while inpatient, may resume metformin 48 hrs post cath   SignedChristell Faith, PA-C 10/24/2013 7:58 AM  The patient was seen, examined and discussed with Christell Faith, PA-C and I agree with the above.   63 year old male post left cardiac cath yesterday for chest pain, non-obstructive, CP most probably musculoskeletal. We will continue aggressive medical management with high dose atorvastatin, fenofibtrate, ASA, lisinopril, metoprolol.  There is significant sinus bradycardia on telemetry with HR as low as 36 BPM and multiple pauses 2.1-2.3 seconds. We will decrease metoprolol to 100 mg po BID and increase lisinopril to 40 mg po daily for BP control. Follow up in our clinic within the next 2 weeks.   Discharge today.   Dorothy Spark 10/24/2013

## 2013-10-24 NOTE — Discharge Summary (Signed)
Seth Pope 10/24/2013

## 2013-10-25 ENCOUNTER — Encounter (HOSPITAL_COMMUNITY): Payer: BC Managed Care – PPO

## 2013-10-25 LAB — GLUCOSE, CAPILLARY
Glucose-Capillary: 78 mg/dL (ref 70–99)
Glucose-Capillary: 110 mg/dL — ABNORMAL HIGH (ref 70–99)

## 2013-10-28 ENCOUNTER — Encounter: Payer: Self-pay | Admitting: *Deleted

## 2013-10-28 ENCOUNTER — Encounter (INDEPENDENT_AMBULATORY_CARE_PROVIDER_SITE_OTHER): Payer: BC Managed Care – PPO

## 2013-10-28 DIAGNOSIS — R001 Bradycardia, unspecified: Secondary | ICD-10-CM

## 2013-10-28 DIAGNOSIS — I498 Other specified cardiac arrhythmias: Secondary | ICD-10-CM

## 2013-10-28 NOTE — Progress Notes (Signed)
Patient ID: Seth Pope, male   DOB: 02/16/1950, 63 y.o.   MRN: [id number] E-Cardio 48 hour holter monitor applied to patient.

## 2013-11-06 ENCOUNTER — Telehealth: Payer: Self-pay | Admitting: *Deleted

## 2013-11-06 NOTE — Telephone Encounter (Signed)
Response faxed to [facility] regarding possible interaction between motoprolol and desipramine

## 2013-11-11 ENCOUNTER — Ambulatory Visit: Payer: BC Managed Care – PPO | Admitting: Internal Medicine

## 2013-11-11 NOTE — Telephone Encounter (Signed)
This encounter opened in error.

## 2013-11-12 ENCOUNTER — Ambulatory Visit (INDEPENDENT_AMBULATORY_CARE_PROVIDER_SITE_OTHER): Payer: BC Managed Care – PPO | Admitting: Emergency Medicine

## 2013-11-12 VITALS — BP 111/68 | HR 71 | Temp 97.6°F | Resp 16 | Ht 68.5 in | Wt 183.8 lb

## 2013-11-12 DIAGNOSIS — Z23 Encounter for immunization: Secondary | ICD-10-CM

## 2013-11-12 DIAGNOSIS — E119 Type 2 diabetes mellitus without complications: Secondary | ICD-10-CM

## 2013-11-12 DIAGNOSIS — R0602 Shortness of breath: Secondary | ICD-10-CM

## 2013-11-12 DIAGNOSIS — R079 Chest pain, unspecified: Secondary | ICD-10-CM

## 2013-11-12 LAB — POCT GLYCOSYLATED HEMOGLOBIN (HGB A1C): Hemoglobin A1C: 6.3

## 2013-11-12 LAB — GLUCOSE, POCT (MANUAL RESULT ENTRY): POC Glucose: 147 mg/dl — AB (ref 70–99)

## 2013-11-12 NOTE — Progress Notes (Signed)
   Subjective:    Patient ID: Seth Pope, male    DOB: [birth year], 63 y.o.   MRN: [id number]  HPI patient here for recheck. He has done well since his discharge and catheterization. He still has twinges of chest discomfort. He has completed his Holter monitor. He feels that the change in his beta blocker dosage has helped his symptoms. He is scheduled to see Dr. Debara Pickett tomorrow to    Review of Systems     Objective:   Physical Exam  Constitutional: He appears well-developed and well-nourished.  HENT:  Head: Normocephalic.  Eyes: Pupils are equal, round, and reactive to light.  Neck: No thyromegaly present.  Cardiovascular: Normal rate, regular rhythm and normal heart sounds.   Pulmonary/Chest: Effort normal and breath sounds normal. No respiratory distress. He has no wheezes. He has no rales.  Abdominal: Soft. He exhibits no distension. There is no tenderness.  Musculoskeletal: Normal range of motion.   Hba1c  6.3      Assessment & Plan:  Diabetes at goal. To see Dr. Debara Pickett tomorrow. Flu shot and prevnar given.

## 2013-11-12 NOTE — Addendum Note (Signed)
Addended by: Lanna Poche R on: 11/12/2013 01:58 PM   Modules accepted: Orders

## 2013-11-13 ENCOUNTER — Ambulatory Visit (INDEPENDENT_AMBULATORY_CARE_PROVIDER_SITE_OTHER): Payer: BC Managed Care – PPO | Admitting: Physician Assistant

## 2013-11-13 ENCOUNTER — Encounter: Payer: Self-pay | Admitting: Physician Assistant

## 2013-11-13 VITALS — BP 100/70 | HR 57 | Ht 70.0 in | Wt 186.4 lb

## 2013-11-13 DIAGNOSIS — I498 Other specified cardiac arrhythmias: Secondary | ICD-10-CM

## 2013-11-13 DIAGNOSIS — R079 Chest pain, unspecified: Secondary | ICD-10-CM

## 2013-11-13 DIAGNOSIS — R001 Bradycardia, unspecified: Secondary | ICD-10-CM | POA: Insufficient documentation

## 2013-11-13 DIAGNOSIS — E785 Hyperlipidemia, unspecified: Secondary | ICD-10-CM

## 2013-11-13 DIAGNOSIS — E119 Type 2 diabetes mellitus without complications: Secondary | ICD-10-CM

## 2013-11-13 DIAGNOSIS — I10 Essential (primary) hypertension: Secondary | ICD-10-CM

## 2013-11-13 MED ORDER — LISINOPRIL 10 MG PO TABS: 10.0000 mg | ORAL_TABLET | Freq: Every day | ORAL | Status: DC

## 2013-11-13 NOTE — Progress Notes (Signed)
Date:  11/13/2013   ID:  Seth Pope, DOB May 04, 1950, MRN [id number]  PCP:  Jenny Reichmann, MD  Primary Cardiologist:  [place]    History of Present Illness: Seth Pope is a 63 y.o. male seen at [facility] on 10/23/13 with complaint of intermittent chest pain. His PMHx significant for HTN, HLD, DM2 with neuropathy, anxiety, and a family Hx of CAD. His father had an MI and died suddenly at age 18. His brother also has CAD and recently had a stent. 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   Exercise Myoview, 10/08/2013, that was nonischemic. It showed fixed inferior and apical defects and mild thinning. There was no evidence for decreased EF and no evidence for wall motion abnormality in this area, so scar is considered less likely. Dr. Debara Pickett felt he could potentially could have balanced ischemia and the patient did have a hypotensive response to exercise, along with dyspnea.  Had PFTs done 10/22/13 that revealed mild lung disease.  The patient underwent cardiac catheterization 10/23/13 which showed  nonobstructive CAD, normal LV function. Recommendations: risk factor modification.   On telemetry he was found to have significant sinus bradycardia with rates as low as 36 BPM and pauses of 2.1-2.3 seconds. He will be set up with a Holter monitor as an outpatient to further assess this and his pre-hospitalization symptomology of fatigue, dyspnea, and "swimmy headedness." Given his bradycardia his Toprol-XL 200 mg was changed to Lopressor 100 mg bid and his lisinopril was increased from 20 mg to 40 mg to compensate for his HTN. He will otherwise remain on his current medications.   Patient presents today for posthospital evaluation.  Patient reports that last Thursday evening he was crouching down looking at the tire on his son's car and when he stood up he suddenly became dizzy and lightheaded. That was the only episode that he had.  He did wear a  Holter monitor for 2 days post hospital and this showed 2  Pauses the longest of which was 2.32 seconds 0349 hours in the morning.  He also reports one episode of chest pain which resolved.  The patient currently denies nausea, vomiting, fever, shortness of breath, orthopnea, dizziness, PND, cough, congestion, abdominal pain, hematochezia, melena, lower extremity edema, claudication.  Wt Readings from Last 3 Encounters:  11/13/13 186 lb 6.4 oz (84.55 kg)  11/12/13 183 lb 12.8 oz (83.371 kg)  10/24/13 187 lb 6.3 oz (85 kg)     Past Medical History  Diagnosis Date  . Hypertension   . Diabetes mellitus   . Hyperlipidemia   . Chronic kidney disease     h/o kidney stone  . Ulcer   . Umbilical hernia   . CAD (coronary artery disease)     a. cath 10/23/13: nonobstructive CAD, nl LV function, rec risk factor modification  . Sinus bradycardia     a. as low as 36 bpm; b. multiple pauses 2.1-2.3 seconds    Current Outpatient Prescriptions  Medication Sig Dispense Refill  . ALPRAZolam (XANAX XR) 1 MG 24 hr tablet Take 1 mg by mouth daily.      Marland Kitchen ALPRAZolam (XANAX) 0.25 MG tablet Take 1 mg by mouth at bedtime as needed for anxiety.       Marland Kitchen aspirin 81 MG tablet Take 81 mg by mouth every morning.       Marland Kitchen atorvastatin (LIPITOR) 80 MG tablet Take 80  mg by mouth every morning.       . desipramine (NORPRAMIN) 50 MG tablet Take 150 mg by mouth at bedtime.       . fenofibrate 54 MG tablet Take 1 tablet (54 mg total) by mouth daily.  30 tablet  5  . gabapentin (NEURONTIN) 300 MG capsule take 1 capsule by mouth daily      . hydrochlorothiazide (MICROZIDE) 12.5 MG capsule Take 1 capsule (12.5 mg total) by mouth daily.  30 capsule  1  . ibuprofen (ADVIL,MOTRIN) 200 MG tablet Take 400 mg by mouth every 6 (six) hours as needed for headache.      . lisinopril (PRINIVIL,ZESTRIL) 10 MG tablet Take 1 tablet (10 mg total) by mouth daily.  30 tablet  12  . metFORMIN (GLUCOPHAGE-XR) 500 MG 24 hr tablet Take 1  tablet (500 mg total) by mouth 2 (two) times daily. May restart on the evening of 10/25/2013.  60 tablet  3  . metoprolol (LOPRESSOR) 100 MG tablet Take 1 tablet (100 mg total) by mouth 2 (two) times daily.  60 tablet  1  . nitroGLYCERIN (NITROSTAT) 0.4 MG SL tablet Place one under the tongue as needed for chest pain. Repeat in 5 minutes if no effect. Call 911 if you need to take the medication  30 tablet  3   No current facility-administered medications for this visit.    Allergies:    Allergies  Allergen Reactions  . Penicillins Rash    Social History:  The patient  reports that he quit smoking about 27 years ago. His smoking use included Cigarettes. He has a 45 pack-year smoking history. He does not have any smokeless tobacco history on file. He reports that he drinks alcohol. He reports that he does not use illicit drugs.   Family history:   Family History  Problem Relation Age of Onset  . Diabetes Sister     HTN  . Diabetes Brother     HTN, heart problems  . Heart attack Father   . Hypertension Father   . Heart disease Father   . Hypertension    . Diabetes Paternal Grandmother   . Heart disease Paternal Grandfather     ROS:  Please see the history of present illness.  All other systems reviewed and negative.   PHYSICAL EXAM: VS:  BP 100/70  Pulse 57  Ht 5\' 10"  (1.778 m)  Wt 186 lb 6.4 oz (84.55 kg)  BMI 26.75 kg/m2 Well nourished, well developed, in no acute distress HEENT: Pupils are equal round react to light accommodation extraocular movements are intact.  Neck: no JVDNo cervical lymphadenopathy. Cardiac: Regular rate and rhythm without murmurs rubs or gallops. Lungs:  clear to auscultation bilaterally, no wheezing, rhonchi or rales Abd: soft, nontender, positive bowel sounds all quadrants, no hepatosplenomegaly Ext: no lower extremity edema.  2+ radial and dorsalis pedis pulses. Skin: warm and dry Neuro:  Grossly normal  EKG: Sinus bradycardia rate 57 beats per  minute incomplete right bundle     ASSESSMENT AND PLAN:  Problem List Items Addressed This Visit   Bradycardia     Patient had 2.3 second pause approximately 0349 hours while wearing the Holter monitor.  He has not had any daytime pauses noted. We'll continue Lopressor 100 mg twice daily.    Chest pain with moderate risk of acute coronary syndrome - Primary     Nonobstructive CAD on cath 10/23/2013.  Normal LV function.  Chest pain was thought to be  musculoskeletal    Relevant Medications      lisinopril (PRINIVIL,ZESTRIL) tablet   Other Relevant Orders      EKG 12-Lead   DM2 (diabetes mellitus, type 2)     On metformin. A1c 11/12/2013 was 6.3    Relevant Medications      lisinopril (PRINIVIL,ZESTRIL) tablet   Hyperlipidemia      On Lipitor  Lipid Panel     Component Value Date/Time   CHOL 115 08/13/2013 0915   TRIG 90 08/13/2013 0915   HDL 38* 08/13/2013 0915   CHOLHDL 3.0 08/13/2013 0915   VLDL 18 08/13/2013 0915   LDLCALC 59 08/13/2013 0915        Relevant Medications      lisinopril (PRINIVIL,ZESTRIL) tablet   Hypertension     The patient's Bp this morning is 100/60 and he has not taken any meds yet.  I am decreasing his lisinopril from 20 to 10 mg daily.  This may partially explain his dizziness upon standing last Thursday evening.Rene Paci also asked him to track his blood pressure over the next week changing the time of day from morning afternoon and evening.  Followup in 3 months with Dr. Debara Pickett    Relevant Medications      lisinopril (PRINIVIL,ZESTRIL) tablet

## 2013-11-13 NOTE — Assessment & Plan Note (Signed)
The patient's Bp this morning is 100/60 and he has not taken any meds yet.  I am decreasing his lisinopril from 20 to 10 mg daily.  This may partially explain his dizziness upon standing last Thursday evening.Rene Paci also asked him to track his blood pressure over the next week changing the time of day from morning afternoon and evening.  Followup in 3 months with Dr. Debara Pickett

## 2013-11-13 NOTE — Assessment & Plan Note (Signed)
Nonobstructive CAD on cath 10/23/2013.  Normal LV function.  Chest pain was thought to be musculoskeletal

## 2013-11-13 NOTE — Patient Instructions (Signed)
Your physician recommends that you schedule a follow-up appointment in: 3 MONTHS WITH DR HILTY  DECREASE LISINOPRIL TO 10 MG ONCE DAILY  TRACK BLOOD PRESSURE AT HOME

## 2013-11-13 NOTE — Assessment & Plan Note (Signed)
On metformin. A1c 11/12/2013 was 6.3

## 2013-11-13 NOTE — Assessment & Plan Note (Signed)
Patient had 2.3 second pause approximately 0349 hours while wearing the Holter monitor.  He has not had any daytime pauses noted. We'll continue Lopressor 100 mg twice daily.

## 2013-11-13 NOTE — Assessment & Plan Note (Signed)
On Lipitor  Lipid Panel     Component Value Date/Time   CHOL 115 08/13/2013 0915   TRIG 90 08/13/2013 0915   HDL 38* 08/13/2013 0915   CHOLHDL 3.0 08/13/2013 0915   VLDL 18 08/13/2013 0915   LDLCALC 59 08/13/2013 0915

## 2013-11-18 ENCOUNTER — Ambulatory Visit: Payer: BC Managed Care – PPO | Admitting: Internal Medicine

## 2013-12-01 ENCOUNTER — Other Ambulatory Visit: Payer: Self-pay | Admitting: Emergency Medicine

## 2014-01-13 ENCOUNTER — Other Ambulatory Visit: Payer: Self-pay | Admitting: Emergency Medicine

## 2014-01-23 ENCOUNTER — Encounter (HOSPITAL_COMMUNITY): Payer: Self-pay | Admitting: Cardiology

## 2014-01-30 ENCOUNTER — Other Ambulatory Visit: Payer: Self-pay | Admitting: Radiology

## 2014-01-30 ENCOUNTER — Telehealth: Payer: Self-pay | Admitting: Emergency Medicine

## 2014-01-30 ENCOUNTER — Telehealth: Payer: Self-pay | Admitting: Internal Medicine

## 2014-01-30 MED ORDER — METOPROLOL TARTRATE 100 MG PO TABS: 100.0000 mg | ORAL_TABLET | Freq: Two times a day (BID) | ORAL | Status: DC

## 2014-01-31 NOTE — Telephone Encounter (Signed)
Close encounter 

## 2014-02-04 ENCOUNTER — Ambulatory Visit (INDEPENDENT_AMBULATORY_CARE_PROVIDER_SITE_OTHER): Payer: BC Managed Care – PPO | Admitting: Internal Medicine

## 2014-02-04 ENCOUNTER — Encounter: Payer: Self-pay | Admitting: Internal Medicine

## 2014-02-04 VITALS — BP 116/68 | HR 63 | Ht 70.5 in | Wt 189.2 lb

## 2014-02-04 DIAGNOSIS — I10 Essential (primary) hypertension: Secondary | ICD-10-CM

## 2014-02-04 DIAGNOSIS — I493 Ventricular premature depolarization: Secondary | ICD-10-CM

## 2014-02-04 DIAGNOSIS — Z720 Tobacco use: Secondary | ICD-10-CM

## 2014-02-04 DIAGNOSIS — R0602 Shortness of breath: Secondary | ICD-10-CM

## 2014-02-04 DIAGNOSIS — E1142 Type 2 diabetes mellitus with diabetic polyneuropathy: Secondary | ICD-10-CM

## 2014-02-04 NOTE — Patient Instructions (Signed)
Your physician wants you to follow-up in: 1 year with Dr. Hilty. You will receive a reminder letter in the mail two months in advance. If you don't receive a letter, please call our office to schedule the follow-up appointment.  

## 2014-02-04 NOTE — Progress Notes (Signed)
OFFICE NOTE  Chief Complaint:  Follow-up  Primary Care Physician: Jenny Reichmann, MD  HPI:  Seth Pope is a pleasant 63 year old male has been referred to me by Dr. Everlene Farrier. His past medical history significant for hypertension, dyslipidemia, some anxiety, diabetes type 2 and a family history of heart disease. His father had a heart attack and died suddenly at age 39. His brother also has heart disease and recently had a stent. 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 He's had several episodes were his bed awakened in the morning with tightness in the chest. Also felt sometimes her chest and shortness of breath. He was recently on vacation for several days and even during that time had some episodes of chest discomfort. The symptoms are described as a pressure and deep inside his chest. He says the pain is minimal about 2-3/10 but definitely abnormal. He's been having this symptom first some time and has not told his wife but mentioned it to his primary doctor who advised him right away to have it evaluated.  Seth Pope returns today for followup. He underwent a nuclear stress test which was nonischemic that showed fixed inferoapical defects and mild thinning. There was no evidence for decreased EF and no evidence for wall motion abnormality in this area so scar is considered less likely. He could potentially have balanced ischemia, and did have a hypotensive response to exercise. Some of these symptoms seem somewhat anginal however others do not. He feels that he recently has been having more shortness of breath particularly in hot weather which precedes his chest discomfort but is able to exercise in cold weather without any problems. I wonder if his symptoms are related possibly to COPD. He does have a significant smoking history.  [name] in the office today. He has no complaints. In the interim he saw Tenny Craw, PA-C-C, who made some adjustments on his medications. He thinks that spacing at the beta blocker actually help some with his symptoms. He denies any shortness of breath today. He has not had anymore cardiac chest pain. Fortunately his coronary arteries look good as was previously mentioned with no evidence for obstruction on heart catheterization.  PMHx:  Past Medical History  Diagnosis Date  . Hypertension   . Diabetes mellitus   . Hyperlipidemia   . Chronic kidney disease     h/o kidney stone  . Ulcer   . Umbilical hernia   . CAD (coronary artery disease)     a. cath 10/23/13: nonobstructive CAD, nl LV function, rec risk factor modification  . Sinus bradycardia     a. as low as 36 bpm; b. multiple pauses 2.1-2.3 seconds    Past Surgical History  Procedure Laterality Date  . Knee surgery  1970  . Rotator cuff repair  1991    x2  . Umbilical hernia repair    . Back surgery    . Nm myocar perf wall motion  08/2010    bruce myoview - normal perfusion in all regions, EF 66%, EKG negative for ischemia, no wall motion abnormalities, normal/low risk study                                       . Left heart catheterization with coronary angiogram N/A 10/23/2013  Procedure: LEFT HEART CATHETERIZATION WITH CORONARY ANGIOGRAM;  Surgeon: Peter M Martinique, MD;  Location: [facility] CATH LAB;  Service: Cardiovascular;  Laterality: N/A;    FAMHx:  Family History  Problem Relation Age of Onset  . Diabetes Sister     HTN  . Diabetes Brother     HTN, heart problems  . Heart attack Father   . Hypertension Father   . Heart disease Father   . Hypertension    . Diabetes Paternal Grandmother   . Heart disease Paternal Grandfather     SOCHx:   reports that he quit smoking about 27 years ago. His smoking use included Cigarettes. He has a 45 pack-year smoking history. He does not have any smokeless tobacco history on file. He reports that he drinks alcohol. He reports that he does not use illicit  drugs.  ALLERGIES:  Allergies  Allergen Reactions  . Penicillins Rash    ROS: A comprehensive review of systems was negative.  HOME MEDS: Current Outpatient Prescriptions  Medication Sig Dispense Refill  . ALPRAZolam (XANAX XR) 1 MG 24 hr tablet Take 1 mg by mouth daily.    Marland Kitchen ALPRAZolam (XANAX) 0.25 MG tablet Take 1 mg by mouth at bedtime as needed for anxiety.     Marland Kitchen aspirin 81 MG tablet Take 81 mg by mouth every morning.     Marland Kitchen atorvastatin (LIPITOR) 80 MG tablet Take 80 mg by mouth every morning.     . desipramine (NORPRAMIN) 50 MG tablet Take 150 mg by mouth at bedtime.     . fenofibrate 54 MG tablet take 1 tablet by mouth once daily 30 tablet 5  . gabapentin (NEURONTIN) 300 MG capsule take 1 capsule by mouth daily    . hydrochlorothiazide (MICROZIDE) 12.5 MG capsule take 1 capsule by mouth once daily 30 capsule 3  . ibuprofen (ADVIL,MOTRIN) 200 MG tablet Take 400 mg by mouth every 6 (six) hours as needed for headache.    . lisinopril (PRINIVIL,ZESTRIL) 10 MG tablet Take 1 tablet (10 mg total) by mouth daily. 30 tablet 12  . metFORMIN (GLUCOPHAGE-XR) 500 MG 24 hr tablet Take 1 tablet (500 mg total) by mouth 2 (two) times daily. May restart on the evening of 10/25/2013. 60 tablet 3  . metoprolol (LOPRESSOR) 100 MG tablet Take 1 tablet (100 mg total) by mouth 2 (two) times daily. 180 tablet 3  . nitroGLYCERIN (NITROSTAT) 0.4 MG SL tablet Place one under the tongue as needed for chest pain. Repeat in 5 minutes if no effect. Call 911 if you need to take the medication 30 tablet 3   No current facility-administered medications for this visit.    LABS/IMAGING: No results found for this or any previous visit (from the past 48 hour(s)). No results found.  VITALS: BP 116/68 mmHg  Pulse 63  Ht 5' 10.5" (1.791 m)  Wt 189 lb 3.2 oz (85.821 kg)  BMI 26.75 kg/m2  EXAM: General appearance: alert and no distress Lungs: clear to auscultation bilaterally Heart: regular rate and rhythm,  S1, S2 normal, no murmur, click, rub or gallop Extremities: extremities normal, atraumatic, no cyanosis or edema  EKG: Normal sinus rhythm at 63  ASSESSMENT: 1. Chest pain concerning for unstable angina - low risk nuclear stress test, no significant coronary disease by heart catheterization 2. Abnormal EKG with PVCs 3. Hypertension 4. Dyslipidemia 5. Diabetes type 2 with diabetic neuropathy 6. Anxiety  PLAN: 1.   Seth Pope is now doing much better. He denies any  significant chest pain or worsening shortness of breath. His blood pressure is better controlled. He still has some issues with anxiety but seems to be well-controlled. He had questions today about desipramine which I will defer to his primary care provider or prescribing physician-which may be a psychiatrist. I'm happy to see him back annually or sooner as necessary.  Pixie Casino, MD, [facility] Attending Cardiologist [facility]  HILTY,Kenneth C 02/04/2014, 6:26 PM

## 2014-02-06 ENCOUNTER — Other Ambulatory Visit: Payer: Self-pay | Admitting: Emergency Medicine

## 2014-02-21 ENCOUNTER — Other Ambulatory Visit: Payer: Self-pay | Admitting: Emergency Medicine

## 2014-03-10 ENCOUNTER — Other Ambulatory Visit: Payer: Self-pay

## 2014-03-10 MED ORDER — METFORMIN HCL ER 500 MG PO TB24: 500.0000 mg | ORAL_TABLET | Freq: Two times a day (BID) | ORAL | Status: DC

## 2014-03-23 ENCOUNTER — Other Ambulatory Visit: Payer: Self-pay | Admitting: Physician Assistant

## 2014-04-16 ENCOUNTER — Telehealth: Payer: Self-pay

## 2014-04-16 NOTE — Telephone Encounter (Signed)
Spoke with pt, he states he feels like he was seen recently but there is no office visit before 11/12/2013. Can we refill? He states he will come in for an office visit if you would like. Please advise.

## 2014-04-16 NOTE — Telephone Encounter (Signed)
He has not been seen since 11/12/2013. He was warned at the last refill that he needs an office visit.

## 2014-04-16 NOTE — Telephone Encounter (Signed)
Left message for pt to call back  °

## 2014-04-16 NOTE — Telephone Encounter (Signed)
Patient request a refill on Metformin 500 MG 24 Hr Tablet. Pharmacy [facility] on [address]. [phone]

## 2014-04-17 MED ORDER — METFORMIN HCL ER 500 MG PO TB24: 500.0000 mg | ORAL_TABLET | Freq: Two times a day (BID) | ORAL | Status: DC

## 2014-04-17 NOTE — Telephone Encounter (Signed)
Okay to refill all his medicines. Please make sure he has a follow-up appointment in the next 4-6 weeks

## 2014-04-17 NOTE — Telephone Encounter (Signed)
Spoke with pt, advised Rx sent in and to follow up in 4-6 weeks. Pt understood.

## 2014-05-14 ENCOUNTER — Other Ambulatory Visit: Payer: Self-pay | Admitting: Emergency Medicine

## 2014-05-27 ENCOUNTER — Encounter: Payer: Self-pay | Admitting: Emergency Medicine

## 2014-05-27 ENCOUNTER — Ambulatory Visit (INDEPENDENT_AMBULATORY_CARE_PROVIDER_SITE_OTHER): Payer: BLUE CROSS/BLUE SHIELD | Admitting: Emergency Medicine

## 2014-05-27 VITALS — BP 119/76 | HR 61 | Temp 98.1°F | Resp 16 | Ht 70.0 in | Wt 180.0 lb

## 2014-05-27 DIAGNOSIS — I10 Essential (primary) hypertension: Secondary | ICD-10-CM

## 2014-05-27 DIAGNOSIS — E139 Other specified diabetes mellitus without complications: Secondary | ICD-10-CM

## 2014-05-27 DIAGNOSIS — E785 Hyperlipidemia, unspecified: Secondary | ICD-10-CM

## 2014-05-27 LAB — LIPID PANEL
Cholesterol: 121 mg/dL (ref 0–200)
HDL: 36 mg/dL — ABNORMAL LOW (ref >=40)
LDL Cholesterol: 68 mg/dL (ref 0–99)
Total CHOL/HDL Ratio: 3.4 Ratio
Triglycerides: 83 mg/dL (ref <150)
VLDL: 17 mg/dL (ref 0–40)

## 2014-05-27 LAB — BASIC METABOLIC PANEL WITH GFR
BUN: 17 mg/dL (ref 6–23)
CO2: 22 mEq/L (ref 19–32)
Calcium: 8.8 mg/dL (ref 8.4–10.5)
Chloride: 105 mEq/L (ref 96–112)
Creat: 0.94 mg/dL (ref 0.50–1.35)
GFR, Est African American: >89 mL/min
GFR, Est Non African American: 87 mL/min
Glucose, Bld: 120 mg/dL — ABNORMAL HIGH (ref 70–99)
Potassium: 4.5 mEq/L (ref 3.5–5.3)
Sodium: 136 mEq/L (ref 135–145)

## 2014-05-27 LAB — GLUCOSE, POCT (MANUAL RESULT ENTRY): POC Glucose: 123 mg/dl — AB (ref 70–99)

## 2014-05-27 LAB — POCT GLYCOSYLATED HEMOGLOBIN (HGB A1C): Hemoglobin A1C: 6.1

## 2014-05-27 MED ORDER — CYCLOBENZAPRINE HCL 5 MG PO TABS: 5.0000 mg | ORAL_TABLET | Freq: Three times a day (TID) | ORAL | Status: DC | PRN

## 2014-05-27 NOTE — Progress Notes (Addendum)
Subjective:  This chart was scribed for Arlyss Queen, MD by Donato Schultz, Medical Scribe. This patient was seen in Room 22 and the patient's care was started at 4:17 PM.   Patient ID: Seth Pope, male    DOB: Jun 16, 1950, 64 y.o.   MRN: [id number]  HPI HPI Comments: Seth Pope is a 64 y.o. male who presents to the [facility] for a follow-up visit regarding his DM and a medication refill.  He denies chest pain as an associated symptom.  He takes 500mg  Metformin and 100mg  Metoprolol BID.    He is also complaining of back pain that started yesterday.  He was picking up a hatchback and felt a pulling sensation in his back.  He has experienced these symptoms a year ago and took Flexeril with complete relief to his symptoms.  He has adverse GI side effects when taking Ibuprofen.  He would like another prescription for Flexeril today.    Past Medical History  Diagnosis Date  . Hypertension   . Diabetes mellitus   . Hyperlipidemia   . Chronic kidney disease     h/o kidney stone  . Ulcer   . Umbilical hernia   . CAD (coronary artery disease)     a. cath 10/23/13: nonobstructive CAD, nl LV function, rec risk factor modification  . Sinus bradycardia     a. as low as 36 bpm; b. multiple pauses 2.1-2.3 seconds   Past Surgical History  Procedure Laterality Date  . Knee surgery  1970  . Rotator cuff repair  1991    x2  . Umbilical hernia repair    . Back surgery    . Nm myocar perf wall motion  08/2010    bruce myoview - normal perfusion in all regions, EF 66%, EKG negative for ischemia, no wall motion abnormalities, normal/low risk study                                       . Left heart catheterization with coronary angiogram N/A 10/23/2013    Procedure: LEFT HEART CATHETERIZATION WITH CORONARY ANGIOGRAM;  Surgeon: Peter M Martinique, MD;  Location: [facility] CATH LAB;  Service: Cardiovascular;  Laterality: N/A;   Family History  Problem Relation Age of Onset  . Diabetes Sister     HTN    . Diabetes Brother     HTN, heart problems  . Heart attack Father   . Hypertension Father   . Heart disease Father   . Hypertension    . Diabetes Paternal Grandmother   . Heart disease Paternal Grandfather    History   Social History  . Marital Status: Married    Spouse Name: N/A  . Number of Children: 2  . Years of Education: 10th    Occupational History  . self-employed (Investment banker, operational)    Social History Main Topics  . Smoking status: Former Smoker -- 2.50 packs/day for 18 years    Types: Cigarettes    Quit date: 07/16/1986  . Smokeless tobacco: Not on file  . Alcohol Use: Yes     Comment: social  . Drug Use: No     Comment: former   . Sexual Activity: Not on file   Other Topics Concern  . Not on file   Social History Narrative   Allergies  Allergen Reactions  . Penicillins Rash    Review of Systems  Cardiovascular: Negative for chest pain.  Musculoskeletal: Positive for back pain.     Objective:  Physical Exam  Constitutional: He is oriented to person, place, and time. He appears well-developed and well-nourished.  HENT:  Head: Normocephalic and atraumatic.  Eyes: EOM are normal.  Neck: Normal range of motion.  Cardiovascular: Normal rate, regular rhythm and normal heart sounds.  Exam reveals no gallop and no friction rub.   No murmur heard. Pulmonary/Chest: Effort normal and breath sounds normal. No respiratory distress. He has no wheezes. He has no rales.  Musculoskeletal: Normal range of motion.  There is mild tenderness right SI joint straight leg raising is positive at 60 there is no focal weakness reflexes 2+ and symmetrical  Neurological: He is alert and oriented to person, place, and time. He has normal reflexes.  Skin: Skin is warm and dry.  Psychiatric: He has a normal mood and affect. His behavior is normal.  Nursing note and vitals reviewed.  Results for orders placed or performed in visit on 05/27/14  POCT glucose (manual entry)   Result Value Ref Range   POC Glucose 123 (A) 70 - 99 mg/dl  POCT glycosylated hemoglobin (Hb A1C)  Result Value Ref Range   Hemoglobin A1C 6.1      BP 119/76 mmHg  Pulse 61  Temp(Src) 98.1 F (36.7 C)  Resp 16  Ht 5\' 10"  (1.778 m)  Wt 180 lb (81.647 kg)  BMI 25.83 kg/m2  SpO2 96% Assessment & Plan:  * Sugars great blood pressures great no change in medication.I personally performed the services described in this documentation, which was scribed in my presence. The recorded information has been reviewed and is accurate. I did give him some Flexeril to have for his back spasm.

## 2014-05-31 ENCOUNTER — Telehealth: Payer: Self-pay

## 2014-05-31 ENCOUNTER — Other Ambulatory Visit: Payer: Self-pay | Admitting: Emergency Medicine

## 2014-05-31 MED ORDER — METFORMIN HCL ER 500 MG PO TB24: 500.0000 mg | ORAL_TABLET | Freq: Two times a day (BID) | ORAL | Status: DC

## 2014-05-31 MED ORDER — FENOFIBRATE 54 MG PO TABS: 54.0000 mg | ORAL_TABLET | Freq: Every day | ORAL | Status: DC

## 2014-05-31 NOTE — Telephone Encounter (Signed)
Patient called in and stated he just saw Dr. Everlene Farrier in the 12th and he needed his meds refilled. He needs his fenofibrate 54 MG tablet and his metFORMIN (GLUCOPHAGE-XR) 500 MG 24 hr tablet. He uses the [place], [place].

## 2014-05-31 NOTE — Telephone Encounter (Signed)
Sent in 6 mos worth.

## 2014-06-10 ENCOUNTER — Other Ambulatory Visit: Payer: Self-pay | Admitting: Emergency Medicine

## 2014-06-15 ENCOUNTER — Other Ambulatory Visit: Payer: Self-pay | Admitting: Emergency Medicine

## 2014-07-14 ENCOUNTER — Ambulatory Visit (INDEPENDENT_AMBULATORY_CARE_PROVIDER_SITE_OTHER): Payer: BLUE CROSS/BLUE SHIELD | Admitting: Family Medicine

## 2014-07-14 ENCOUNTER — Ambulatory Visit (INDEPENDENT_AMBULATORY_CARE_PROVIDER_SITE_OTHER): Payer: BLUE CROSS/BLUE SHIELD

## 2014-07-14 VITALS — BP 108/68 | HR 74 | Temp 98.7°F | Resp 18 | Ht 70.0 in | Wt 182.4 lb

## 2014-07-14 DIAGNOSIS — S86811A Strain of other muscle(s) and tendon(s) at lower leg level, right leg, initial encounter: Secondary | ICD-10-CM | POA: Diagnosis not present

## 2014-07-14 DIAGNOSIS — S86911A Strain of unspecified muscle(s) and tendon(s) at lower leg level, right leg, initial encounter: Secondary | ICD-10-CM

## 2014-07-14 DIAGNOSIS — M25561 Pain in right knee: Secondary | ICD-10-CM | POA: Diagnosis not present

## 2014-07-14 NOTE — Progress Notes (Signed)
[facility] [address], [place] [address] [phone]  Date:  07/14/2014   Name:  Seth Pope   DOB:  09-11-1950   MRN:  [id number]  PCP:  Jenny Reichmann, MD    Chief Complaint: Knee Injury and Knee Pain   History of Present Illness:  Seth Pope is a 65 y.o. very pleasant male patient who presents with the following:  Here today with a right knee complaint.  His PCP is Dr. Everlene Farrier.   One week ago his knee started to hurt some.   As the day goes on it will swell more- overnight it improves.  He is not aware of any injury.  The knee is getting worse.   He has not had this in the past.   He did have a knee operation when he was in the 7th grade for a "cartiledge problem."   It does not pop, click or get stuck.  However it may feel unstable.   The pain is in the medial joint line  Patient Active Problem List   Diagnosis Date Noted  . Bradycardia 11/13/2013  . Family history of coronary artery disease in father 10/23/2013  . Chest pain with moderate risk of acute coronary syndrome 10/23/2013  . Shortness of breath 10/17/2013  . Tobacco abuse 10/17/2013  . Unstable angina 10/04/2013  . PVC's (premature ventricular contractions) 10/04/2013  . Right groin pain 05/22/2012  . Neuropathy, diabetic 01/31/2012  . Hypertension   . DM2 (diabetes mellitus, type 2)   . Hyperlipidemia     Past Medical History  Diagnosis Date  . Hypertension   . Diabetes mellitus   . Hyperlipidemia   . Chronic kidney disease     h/o kidney stone  . Ulcer   . Umbilical hernia   . CAD (coronary artery disease)     a. cath 10/23/13: nonobstructive CAD, nl LV function, rec risk factor modification  . Sinus bradycardia     a. as low as 36 bpm; b. multiple pauses 2.1-2.3 seconds    Past Surgical History  Procedure Laterality Date  . Knee surgery  1970  . Rotator cuff repair  1991    x2  . Umbilical hernia repair    . Back surgery    . Nm myocar perf wall motion  08/2010   bruce myoview - normal perfusion in all regions, EF 66%, EKG negative for ischemia, no wall motion abnormalities, normal/low risk study                                       . Left heart catheterization with coronary angiogram N/A 10/23/2013    Procedure: LEFT HEART CATHETERIZATION WITH CORONARY ANGIOGRAM;  Surgeon: Peter M Martinique, MD;  Location: [facility] CATH LAB;  Service: Cardiovascular;  Laterality: N/A;    History  Substance Use Topics  . Smoking status: Former Smoker -- 2.50 packs/day for 18 years    Types: Cigarettes    Quit date: 07/16/1986  . Smokeless tobacco: Not on file  . Alcohol Use: Yes     Comment: social    Family History  Problem Relation Age of Onset  . Diabetes Sister     HTN  . Diabetes Brother     HTN, heart problems  . Heart attack Father   . Hypertension Father   . Heart disease Father   . Hypertension    .  Diabetes Paternal Grandmother   . Heart disease Paternal Grandfather     Allergies  Allergen Reactions  . Penicillins Rash    Medication list has been reviewed and updated.  Current Outpatient Prescriptions on File Prior to Visit  Medication Sig Dispense Refill  . ALPRAZolam (XANAX XR) 1 MG 24 hr tablet Take 1 mg by mouth daily.    Marland Kitchen aspirin 81 MG tablet Take 81 mg by mouth every morning.     Marland Kitchen atorvastatin (LIPITOR) 80 MG tablet take 1 tablet by mouth once daily 30 tablet 5  . cyclobenzaprine (FLEXERIL) 5 MG tablet Take 1 tablet (5 mg total) by mouth 3 (three) times daily as needed for muscle spasms. 30 tablet 1  . desipramine (NORPRAMIN) 50 MG tablet Take 150 mg by mouth at bedtime.     . fenofibrate 54 MG tablet Take 1 tablet (54 mg total) by mouth daily. 30 tablet 5  . gabapentin (NEURONTIN) 300 MG capsule TAKE 1 CAPSULE BY MOUTH EVERY MORNING AND THEN 2 CAPSULES BY MOUTH EVERY EVENING 90 capsule 1  . hydrochlorothiazide (MICROZIDE) 12.5 MG capsule Take 1 capsule (12.5 mg total) by mouth daily. 30 capsule 5  . ibuprofen (ADVIL,MOTRIN) 200 MG tablet  Take 400 mg by mouth every 6 (six) hours as needed for headache.    . lisinopril (PRINIVIL,ZESTRIL) 10 MG tablet Take 1 tablet (10 mg total) by mouth daily. 30 tablet 12  . metFORMIN (GLUCOPHAGE-XR) 500 MG 24 hr tablet Take 1 tablet (500 mg total) by mouth 2 (two) times daily. 60 tablet 5  . metoprolol (LOPRESSOR) 100 MG tablet Take 1 tablet (100 mg total) by mouth 2 (two) times daily. 180 tablet 3  . nitroGLYCERIN (NITROSTAT) 0.4 MG SL tablet Place one under the tongue as needed for chest pain. Repeat in 5 minutes if no effect. Call 911 if you need to take the medication 30 tablet 3   No current facility-administered medications on file prior to visit.    Review of Systems:  As per HPI- otherwise negative.   Physical Examination: Filed Vitals:   07/14/14 0812  BP: 108/68  Pulse: 74  Temp: 98.7 F (37.1 C)  Resp: 18   Filed Vitals:   07/14/14 0812  Height: 5\' 10"  (1.778 m)  Weight: 182 lb 6.4 oz (82.736 kg)   Body mass index is 26.17 kg/(m^2). Ideal Body Weight: Weight in (lb) to have BMI = 25: 173.9  GEN: WDWN, NAD, Non-toxic, A & O x 3, looks well HEENT: Atraumatic, Normocephalic. Neck supple. No masses, No LAD. Ears and Nose: No external deformity. CV: RRR, No M/G/R. No JVD. No thrill. No extra heart sounds. PULM: CTA B, no wheezes, crackles, rhonchi. No retractions. No resp. distress. No accessory muscle use. ABD: S, NT, ND, +BS. No rebound. No HSM. EXTR: No c/c/e NEURO favoring right knee a bit PSYCH: Normally interactive. Conversant. Not depressed or anxious appearing.  Calm demeanor.  Right knee: small joint effusion.  No heat or redness, no wound.  Tender on the medial joint line and with valgus stress.   Negative anterior drawer.    [facility] reading (PRIMARY) by  Dr. Lorelei Pont. Right knee:  Small effusion, mild degenerative change, possible tiny loose body  RIGHT KNEE - COMPLETE 4+ VIEW  COMPARISON: None.  FINDINGS: No fracture is identified. Moderate to  moderately large joint effusion is seen. Small osteophytes are present about the medial and lateral compartments. Pedunculated osteochondroma off the proximal fibula is noted.  IMPRESSION: Moderate  joint effusion.  Mild appearing degenerative change.  Osteochondroma proximal fibula.  Placed in a hinged knee brace which felt good and supportive to him  Assessment and Plan: Right knee pain - Plan: DG Knee Complete 4 Views Right, Ambulatory referral to Orthopedic Surgery  Knee strain, right, initial encounter - Plan: Ambulatory referral to Orthopedic Surgery  Knee injury- possible MCL or meniscal tear vs more benign injury. Placed in a knee brace.  Referred to ortho for further evaluation.    Signed Lamar Blinks, MD

## 2014-07-14 NOTE — Patient Instructions (Signed)
Wear your knee brace as needed for support Continue ibuprofen and ice as needed I will refer you to ortho to look at your knee; I suspect you have strained the ligament at the inside of your knee or you may have torn the cartilage Let me know if you are getting worse!

## 2014-07-16 ENCOUNTER — Telehealth: Payer: Self-pay

## 2014-07-16 NOTE — Telephone Encounter (Signed)
Patient is confused about his referral and has a couple of questions. Please call and explain! [phone]

## 2014-08-11 ENCOUNTER — Other Ambulatory Visit: Payer: Self-pay

## 2014-10-07 ENCOUNTER — Encounter: Payer: Self-pay | Admitting: Emergency Medicine

## 2014-10-07 ENCOUNTER — Ambulatory Visit (INDEPENDENT_AMBULATORY_CARE_PROVIDER_SITE_OTHER): Payer: BLUE CROSS/BLUE SHIELD | Admitting: Emergency Medicine

## 2014-10-07 VITALS — BP 117/71 | HR 58 | Temp 97.1°F | Resp 16 | Ht 70.0 in | Wt 184.0 lb

## 2014-10-07 DIAGNOSIS — E785 Hyperlipidemia, unspecified: Secondary | ICD-10-CM | POA: Diagnosis not present

## 2014-10-07 DIAGNOSIS — I10 Essential (primary) hypertension: Secondary | ICD-10-CM | POA: Diagnosis not present

## 2014-10-07 DIAGNOSIS — E139 Other specified diabetes mellitus without complications: Secondary | ICD-10-CM | POA: Diagnosis not present

## 2014-10-07 DIAGNOSIS — Z23 Encounter for immunization: Secondary | ICD-10-CM | POA: Diagnosis not present

## 2014-10-07 LAB — GLUCOSE, POCT (MANUAL RESULT ENTRY): POC Glucose: 148 mg/dl — AB (ref 70–99)

## 2014-10-07 LAB — BASIC METABOLIC PANEL WITH GFR
BUN: 20 mg/dL (ref 7–25)
CO2: 23 mmol/L (ref 20–31)
Calcium: 8.9 mg/dL (ref 8.6–10.3)
Chloride: 106 mmol/L (ref 98–110)
Creat: 1.01 mg/dL (ref 0.70–1.25)
GFR, Est African American: >89 mL/min (ref >=60)
GFR, Est Non African American: 79 mL/min (ref >=60)
Glucose, Bld: 122 mg/dL — ABNORMAL HIGH (ref 65–99)
Potassium: 4.4 mmol/L (ref 3.5–5.3)
Sodium: 143 mmol/L (ref 135–146)

## 2014-10-07 LAB — POCT GLYCOSYLATED HEMOGLOBIN (HGB A1C): Hemoglobin A1C: 6.6

## 2014-10-07 LAB — LIPID PANEL
Cholesterol: 99 mg/dL — ABNORMAL LOW (ref 125–200)
HDL: 38 mg/dL — ABNORMAL LOW (ref >=40)
LDL Cholesterol: 46 mg/dL (ref <130)
Total CHOL/HDL Ratio: 2.6 Ratio (ref <=5.0)
Triglycerides: 77 mg/dL (ref <150)
VLDL: 15 mg/dL (ref <30)

## 2014-10-07 MED ORDER — ZOSTER VACCINE LIVE 19400 UNT/0.65ML ~~LOC~~ SOLR: 0.6500 mL | Freq: Once | SUBCUTANEOUS | Status: DC

## 2014-10-07 NOTE — Patient Instructions (Signed)
Please let Abigail Butts know you looked very cool in your glasses today.

## 2014-10-07 NOTE — Progress Notes (Addendum)
This chart was scribed for Arlyss Queen, MD by Leandra Kern, Medical Scribe. This patient was seen in Room 28 and the patient's care was started at 11:17 AM.  Chief Complaint:  Chief Complaint  Patient presents with  . Diabetes    HPI: Seth Pope is a 64 y.o. male with a history of HTN, DM, HLD, and neuropathy who reports to [facility] today for a 51-month follow up regarding his diabetes.  Pt reports that has been doing very well overall. He notes that he tries to keep himself hydrated at all times, as well as watching his diet and leading a healthy lifestyle. Pt denies having chest pains.  Pt reports having an ingrown toe nail that he cut the edge of with a knife, and he notes that the area did become sore. He has been doing wound care on it. He does note that the area seem to be healing.   Pt indicates having a history of shingles long time ago, which he notes that he never received a vaccine for it.  Pt notes that he has keeps up with an optometrist regularly.    Past Medical History  Diagnosis Date  . Hypertension   . Diabetes mellitus   . Hyperlipidemia   . Chronic kidney disease     h/o kidney stone  . Ulcer   . Umbilical hernia   . CAD (coronary artery disease)     a. cath 10/23/13: nonobstructive CAD, nl LV function, rec risk factor modification  . Sinus bradycardia     a. as low as 36 bpm; b. multiple pauses 2.1-2.3 seconds   Past Surgical History  Procedure Laterality Date  . Knee surgery  1970  . Rotator cuff repair  1991    x2  . Umbilical hernia repair    . Back surgery    . Nm myocar perf wall motion  08/2010    bruce myoview - normal perfusion in all regions, EF 66%, EKG negative for ischemia, no wall motion abnormalities, normal/low risk study                                       . Left heart catheterization with coronary angiogram N/A 10/23/2013    Procedure: LEFT HEART CATHETERIZATION WITH CORONARY ANGIOGRAM;  Surgeon: Peter M Martinique, MD;  Location: [facility] CATH  LAB;  Service: Cardiovascular;  Laterality: N/A;   Social History   Social History  . Marital Status: Married    Spouse Name: N/A  . Number of Children: 2  . Years of Education: 10th    Occupational History  . self-employed (Investment banker, operational)    Social History Main Topics  . Smoking status: Former Smoker -- 2.50 packs/day for 18 years    Types: Cigarettes    Quit date: 07/16/1986  . Smokeless tobacco: None  . Alcohol Use: Yes     Comment: social  . Drug Use: No     Comment: former   . Sexual Activity: Not Asked   Other Topics Concern  . None   Social History Narrative   Family History  Problem Relation Age of Onset  . Diabetes Sister     HTN  . Diabetes Brother     HTN, heart problems  . Heart attack Father   . Hypertension Father   . Heart disease Father   . Hypertension    . Diabetes Paternal  Grandmother   . Heart disease Paternal Grandfather    Allergies  Allergen Reactions  . Penicillins Rash   Prior to Admission medications   Medication Sig Start Date End Date Taking? Authorizing Provider  ALPRAZolam (XANAX XR) 1 MG 24 hr tablet Take 1 mg by mouth daily.    Historical Provider, MD  aspirin 81 MG tablet Take 81 mg by mouth every morning.     Historical Provider, MD  atorvastatin (LIPITOR) 80 MG tablet take 1 tablet by mouth once daily 06/11/14   Darlyne Russian, MD  cyclobenzaprine (FLEXERIL) 5 MG tablet Take 1 tablet (5 mg total) by mouth 3 (three) times daily as needed for muscle spasms. 05/27/14   Darlyne Russian, MD  desipramine (NORPRAMIN) 50 MG tablet Take 150 mg by mouth at bedtime.     Historical Provider, MD  fenofibrate 54 MG tablet Take 1 tablet (54 mg total) by mouth daily. 05/31/14   Darlyne Russian, MD  gabapentin (NEURONTIN) 300 MG capsule TAKE 1 CAPSULE BY MOUTH EVERY MORNING AND THEN 2 CAPSULES BY MOUTH EVERY EVENING 03/24/14   Darlyne Russian, MD  hydrochlorothiazide (MICROZIDE) 12.5 MG capsule Take 1 capsule (12.5 mg total) by mouth daily. 06/17/14    Darlyne Russian, MD  ibuprofen (ADVIL,MOTRIN) 200 MG tablet Take 400 mg by mouth every 6 (six) hours as needed for headache.    Historical Provider, MD  lisinopril (PRINIVIL,ZESTRIL) 10 MG tablet Take 1 tablet (10 mg total) by mouth daily. 11/13/13   Brett Canales, PA-C  metFORMIN (GLUCOPHAGE-XR) 500 MG 24 hr tablet Take 1 tablet (500 mg total) by mouth 2 (two) times daily. 05/31/14   Darlyne Russian, MD  metoprolol (LOPRESSOR) 100 MG tablet Take 1 tablet (100 mg total) by mouth 2 (two) times daily. 01/30/14   Darlyne Russian, MD  nitroGLYCERIN (NITROSTAT) 0.4 MG SL tablet Place one under the tongue as needed for chest pain. Repeat in 5 minutes if no effect. Call 911 if you need to take the medication 10/05/13   Darlyne Russian, MD     ROS: The patient denies fevers, chills, night sweats, unintentional weight loss, chest pain, palpitations, wheezing, dyspnea on exertion, nausea, vomiting, abdominal pain, dysuria, hematuria, melena, numbness, weakness, or tingling.  All other systems have been reviewed and were otherwise negative with the exception of those mentioned in the HPI and as above.    PHYSICAL EXAM: Filed Vitals:   10/07/14 1106  BP: 117/71  Pulse: 58  Temp: 97.1 F (36.2 C)  Resp: 16   Body mass index is 26.4 kg/(m^2).   General: Alert, no acute distress HEENT:  Normocephalic, atraumatic, oropharynx patent. Eye: [name] [facility] Cardiovascular:  Regular rate and rhythm, no rubs murmurs or gallops.  No Carotid bruits, radial pulse intact. No pedal edema.  Respiratory: Clear to auscultation bilaterally.  No wheezes, rales, or rhonchi.  No cyanosis, no use of accessory musculature Abdominal: No organomegaly, abdomen is soft and non-tender, positive bowel sounds.  No masses. Musculoskeletal: Gait intact. No edema, tenderness Skin: No rashes. Neurologic: Facial musculature symmetric. Psychiatric: Patient acts appropriately throughout our interaction. Lymphatic: No cervical or submandibular  lymphadenopathy Genitourinary/Anorectal: No acute findings    LABS: Results for orders placed or performed in visit on 10/07/14  POCT glucose (manual entry)  Result Value Ref Range   POC Glucose 148 (A) 70 - 99 mg/dl  POCT glycosylated hemoglobin (Hb A1C)  Result Value Ref Range   Hemoglobin A1C 6.6  EKG/XRAY:   Primary read interpreted by Dr. Everlene Farrier at [facility].   ASSESSMENT/PLAN: Sugar not under as good control. I suspect this is secondary to Gatorade while he is working. He looks good today and feels good routine labs were done. Flu shot was given today. He was given a prescription for shingles vaccine. Will consider Prevnar vaccine on his next visit. He had pneumococcal vaccine last year.I personally performed the services described in this documentation, which was scribed in my presence. The recorded information has been reviewed and is accurate.  Nena Jordan, MD    Gross sideeffects, risk and benefits, and alternatives of medications d/w patient. Patient is aware that all medications have potential sideeffects and we are unable to predict every sideeffect or drug-drug interaction that may occur.  Arlyss Queen MD 10/07/2014 11:17 AM

## 2014-11-12 ENCOUNTER — Other Ambulatory Visit: Payer: Self-pay | Admitting: Emergency Medicine

## 2014-11-13 ENCOUNTER — Ambulatory Visit: Payer: Self-pay | Admitting: Emergency Medicine

## 2014-11-18 ENCOUNTER — Encounter: Payer: Self-pay | Admitting: Emergency Medicine

## 2014-11-20 ENCOUNTER — Other Ambulatory Visit: Payer: Self-pay

## 2014-11-20 DIAGNOSIS — R079 Chest pain, unspecified: Secondary | ICD-10-CM

## 2014-11-20 MED ORDER — LISINOPRIL 10 MG PO TABS: 10.0000 mg | ORAL_TABLET | Freq: Every day | ORAL | Status: DC

## 2014-12-12 ENCOUNTER — Other Ambulatory Visit: Payer: Self-pay | Admitting: Emergency Medicine

## 2015-01-14 ENCOUNTER — Other Ambulatory Visit: Payer: Self-pay

## 2015-01-14 MED ORDER — METFORMIN HCL ER 500 MG PO TB24: 500.0000 mg | ORAL_TABLET | Freq: Two times a day (BID) | ORAL | Status: DC

## 2015-01-15 ENCOUNTER — Other Ambulatory Visit: Payer: Self-pay | Admitting: Emergency Medicine

## 2015-04-07 ENCOUNTER — Ambulatory Visit: Payer: BLUE CROSS/BLUE SHIELD | Admitting: Emergency Medicine

## 2015-04-09 ENCOUNTER — Ambulatory Visit (INDEPENDENT_AMBULATORY_CARE_PROVIDER_SITE_OTHER): Payer: BLUE CROSS/BLUE SHIELD | Admitting: Emergency Medicine

## 2015-04-09 ENCOUNTER — Encounter: Payer: Self-pay | Admitting: Emergency Medicine

## 2015-04-09 VITALS — BP 110/68 | HR 60 | Temp 98.1°F | Resp 16 | Ht 70.0 in | Wt 196.0 lb

## 2015-04-09 DIAGNOSIS — R079 Chest pain, unspecified: Secondary | ICD-10-CM | POA: Diagnosis not present

## 2015-04-09 DIAGNOSIS — E139 Other specified diabetes mellitus without complications: Secondary | ICD-10-CM

## 2015-04-09 DIAGNOSIS — I10 Essential (primary) hypertension: Secondary | ICD-10-CM

## 2015-04-09 DIAGNOSIS — R490 Dysphonia: Secondary | ICD-10-CM

## 2015-04-09 DIAGNOSIS — E785 Hyperlipidemia, unspecified: Secondary | ICD-10-CM | POA: Diagnosis not present

## 2015-04-09 LAB — BASIC METABOLIC PANEL WITH GFR
BUN: 18 mg/dL (ref 7–25)
CO2: 24 mmol/L (ref 20–31)
Calcium: 9.1 mg/dL (ref 8.6–10.3)
Chloride: 104 mmol/L (ref 98–110)
Creat: 1.09 mg/dL (ref 0.70–1.25)
GFR, Est African American: 84 mL/min (ref >=60)
GFR, Est Non African American: 72 mL/min (ref >=60)
Glucose, Bld: 147 mg/dL — ABNORMAL HIGH (ref 65–99)
Potassium: 4.4 mmol/L (ref 3.5–5.3)
Sodium: 138 mmol/L (ref 135–146)

## 2015-04-09 LAB — LIPID PANEL
Cholesterol: 128 mg/dL (ref 125–200)
HDL: 37 mg/dL — ABNORMAL LOW (ref >=40)
LDL Cholesterol: 68 mg/dL (ref <130)
Total CHOL/HDL Ratio: 3.5 Ratio (ref <=5.0)
Triglycerides: 114 mg/dL (ref <150)
VLDL: 23 mg/dL (ref <30)

## 2015-04-09 LAB — GLUCOSE, POCT (MANUAL RESULT ENTRY): POC Glucose: 155 mg/dl — AB (ref 70–99)

## 2015-04-09 LAB — POCT GLYCOSYLATED HEMOGLOBIN (HGB A1C): Hemoglobin A1C: 7.2

## 2015-04-09 MED ORDER — GABAPENTIN 300 MG PO CAPS: ORAL_CAPSULE | ORAL | Status: DC

## 2015-04-09 NOTE — Progress Notes (Deleted)
Chief Complaint:  Chief Complaint  Patient presents with  . Medication Refill  . Numbness    feet bilaterally, mainly in the morning    HPI: Seth Pope is a 65 y.o. male who reports to [facility] today complaining of ***  Past Medical History  Diagnosis Date  . Hypertension   . Diabetes mellitus   . Hyperlipidemia   . Chronic kidney disease     h/o kidney stone  . Ulcer   . Umbilical hernia   . CAD (coronary artery disease)     a. cath 10/23/13: nonobstructive CAD, nl LV function, rec risk factor modification  . Sinus bradycardia     a. as low as 36 bpm; b. multiple pauses 2.1-2.3 seconds   Past Surgical History  Procedure Laterality Date  . Knee surgery  1970  . Rotator cuff repair  1991    x2  . Umbilical hernia repair    . Back surgery    . Nm myocar perf wall motion  08/2010    bruce myoview - normal perfusion in all regions, EF 66%, EKG negative for ischemia, no wall motion abnormalities, normal/low risk study                                       . Left heart catheterization with coronary angiogram N/A 10/23/2013    Procedure: LEFT HEART CATHETERIZATION WITH CORONARY ANGIOGRAM;  Surgeon: Seth M Martinique, MD;  Location: [facility] CATH LAB;  Service: Cardiovascular;  Laterality: N/A;   Social History   Social History  . Marital Status: Married    Spouse Name: N/A  . Number of Children: 2  . Years of Education: 10th    Occupational History  . self-employed (Investment banker, operational)    Social History Main Topics  . Smoking status: Former Smoker -- 2.50 packs/day for 18 years    Types: Cigarettes    Quit date: 07/16/1986  . Smokeless tobacco: None  . Alcohol Use: Yes     Comment: social  . Drug Use: No     Comment: former   . Sexual Activity: Not Asked   Other Topics Concern  . None   Social History Narrative   Family History  Problem Relation Age of Onset  . Diabetes Sister     HTN  . Diabetes Brother     HTN, heart problems  . Heart attack Father   .  Hypertension Father   . Heart disease Father   . Hypertension    . Diabetes Paternal Grandmother   . Heart disease Paternal Grandfather    Allergies  Allergen Reactions  . Penicillins Rash   Prior to Admission medications   Medication Sig Start Date End Date Taking? Authorizing Provider  ALPRAZolam (XANAX XR) 1 MG 24 hr tablet Take 1 mg by mouth daily.   Yes Historical Provider, MD  aspirin 81 MG tablet Take 81 mg by mouth every morning.    Yes Historical Provider, MD  atorvastatin (LIPITOR) 80 MG tablet take 1 tablet by mouth once daily 12/12/14  Yes Darlyne Russian, MD  cyclobenzaprine (FLEXERIL) 5 MG tablet Take 1 tablet (5 mg total) by mouth 3 (three) times daily as needed for muscle spasms. 05/27/14  Yes Darlyne Russian, MD  desipramine (NORPRAMIN) 50 MG tablet Take 150 mg by mouth at bedtime.    Yes Historical Provider, MD  fenofibrate 54  MG tablet take 1 tablet by mouth once daily 11/14/14  Yes Darlyne Russian, MD  gabapentin (NEURONTIN) 300 MG capsule TAKE 1 CAPSULE BY MOUTH ONCE EVERY MORNING AND THEN TAKE 2 CAPSULES BY MOUTH EVERY EVENING 01/19/15  Yes Darlyne Russian, MD  hydrochlorothiazide (MICROZIDE) 12.5 MG capsule take 1 capsule by mouth once daily 12/12/14  Yes Darlyne Russian, MD  ibuprofen (ADVIL,MOTRIN) 200 MG tablet Take 400 mg by mouth every 6 (six) hours as needed for headache.   Yes Historical Provider, MD  lisinopril (PRINIVIL,ZESTRIL) 10 MG tablet Take 1 tablet (10 mg total) by mouth daily. 11/20/14  Yes Darlyne Russian, MD  metFORMIN (GLUCOPHAGE-XR) 500 MG 24 hr tablet Take 1 tablet (500 mg total) by mouth 2 (two) times daily. 01/14/15  Yes Darlyne Russian, MD  metoprolol (LOPRESSOR) 100 MG tablet TAKE 1 TABLET BY MOUTH TWICE A DAY 01/15/15  Yes Darlyne Russian, MD  nitroGLYCERIN (NITROSTAT) 0.4 MG SL tablet Place one under the tongue as needed for chest pain. Repeat in 5 minutes if no effect. Call 911 if you need to take the medication 10/05/13  Yes Darlyne Russian, MD  zoster vaccine  live, PF, (ZOSTAVAX) 91478 UNT/0.65ML injection Inject 19,400 Units into the skin once. Patient not taking: Reported on 04/09/2015 10/07/14   Darlyne Russian, MD     ROS: The patient denies fevers, chills, night sweats, unintentional weight loss, chest pain, palpitations, wheezing, dyspnea on exertion, nausea, vomiting, abdominal pain, dysuria, hematuria, melena, numbness, weakness, or tingling. ***  All other systems have been reviewed and were otherwise negative with the exception of those mentioned in the HPI and as above.    PHYSICAL EXAM: Filed Vitals:   04/09/15 1633  BP: 110/68  Pulse: 60  Temp: 98.1 F (36.7 C)  Resp: 16   Body mass index is 28.12 kg/(m^2).   General: Alert, no acute distress HEENT:  Normocephalic, atraumatic, oropharynx patent. Eye: [name] [facility] Cardiovascular:  Regular rate and rhythm, no rubs murmurs or gallops.  No Carotid bruits, radial pulse intact. No pedal edema.  Respiratory: Clear to auscultation bilaterally.  No wheezes, rales, or rhonchi.  No cyanosis, no use of accessory musculature Abdominal: No organomegaly, abdomen is soft and non-tender, positive bowel sounds.  No masses. Musculoskeletal: Gait intact. No edema, tenderness Skin: No rashes. Neurologic: Facial musculature symmetric. Psychiatric: Patient acts appropriately throughout our interaction. Lymphatic: No cervical or submandibular lymphadenopathy ***   LABS:    EKG/XRAY:   Primary read interpreted by Dr. Everlene Farrier at [facility].   ASSESSMENT/PLAN: ***   Gross sideeffects, risk and benefits, and alternatives of medications d/w patient. Patient is aware that all medications have potential sideeffects and we are unable to predict every sideeffect or drug-drug interaction that may occur.  Arlyss Queen MD 04/09/2015 4:55 PM

## 2015-04-09 NOTE — Progress Notes (Addendum)
By signing my name below, I, Rawaa Al Rifaie, attest that this documentation has been prepared under the direction and in the presence of Arlyss Queen, MD.  Leandra Kern, Medical Scribe. 04/09/2015.  4:52 PM.  Chief Complaint:  Chief Complaint  Patient presents with  . Medication Refill  . Numbness    feet bilaterally, mainly in the morning    HPI: Seth Pope is a 65 y.o. male with a history of DM who reports to [facility] today complaining of numbness in the feet BL.  Pt reports that the numbness is present in the palm of his feet, and notes that when he wears socks he presents with the sensation of wet feet/socks. He state the the tingling sensation is mainly present in the morning time. He is compliant with taking gabapentin 1 dose at the morning time. He indicates that he does not experience somnolence with it. Pt is compliant with taking toprol and despramine for his sleep disturbance. Pt does not take vitamins regularly.   Voice change: Pt presents with intermittent hoarseness in the voice that he indicates resolves within hours with no interventions. He states that it could be possibly related to allergies. He denies heartburn.   Pt is not interested in retiring soon.    Past Medical History  Diagnosis Date  . Hypertension   . Diabetes mellitus   . Hyperlipidemia   . Chronic kidney disease     h/o kidney stone  . Ulcer   . Umbilical hernia   . CAD (coronary artery disease)     a. cath 10/23/13: nonobstructive CAD, nl LV function, rec risk factor modification  . Sinus bradycardia     a. as low as 36 bpm; b. multiple pauses 2.1-2.3 seconds   Past Surgical History  Procedure Laterality Date  . Knee surgery  1970  . Rotator cuff repair  1991    x2  . Umbilical hernia repair    . Back surgery    . Nm myocar perf wall motion  08/2010    bruce myoview - normal perfusion in all regions, EF 66%, EKG negative for ischemia, no wall motion abnormalities, normal/low risk study                                        . Left heart catheterization with coronary angiogram N/A 10/23/2013    Procedure: LEFT HEART CATHETERIZATION WITH CORONARY ANGIOGRAM;  Surgeon: Peter M Martinique, MD;  Location: [facility] CATH LAB;  Service: Cardiovascular;  Laterality: N/A;   Social History   Social History  . Marital Status: Married    Spouse Name: N/A  . Number of Children: 2  . Years of Education: 10th    Occupational History  . self-employed (Investment banker, operational)    Social History Main Topics  . Smoking status: Former Smoker -- 2.50 packs/day for 18 years    Types: Cigarettes    Quit date: 07/16/1986  . Smokeless tobacco: None  . Alcohol Use: Yes     Comment: social  . Drug Use: No     Comment: former   . Sexual Activity: Not Asked   Other Topics Concern  . None   Social History Narrative   Family History  Problem Relation Age of Onset  . Diabetes Sister     HTN  . Diabetes Brother     HTN, heart problems  . Heart  attack Father   . Hypertension Father   . Heart disease Father   . Hypertension    . Diabetes Paternal Grandmother   . Heart disease Paternal Grandfather    Allergies  Allergen Reactions  . Penicillins Rash   Prior to Admission medications   Medication Sig Start Date End Date Taking? Authorizing Provider  ALPRAZolam (XANAX XR) 1 MG 24 hr tablet Take 1 mg by mouth daily.   Yes Historical Provider, MD  aspirin 81 MG tablet Take 81 mg by mouth every morning.    Yes Historical Provider, MD  atorvastatin (LIPITOR) 80 MG tablet take 1 tablet by mouth once daily 12/12/14  Yes Darlyne Russian, MD  cyclobenzaprine (FLEXERIL) 5 MG tablet Take 1 tablet (5 mg total) by mouth 3 (three) times daily as needed for muscle spasms. 05/27/14  Yes Darlyne Russian, MD  desipramine (NORPRAMIN) 50 MG tablet Take 150 mg by mouth at bedtime.    Yes Historical Provider, MD  fenofibrate 54 MG tablet take 1 tablet by mouth once daily 11/14/14  Yes Darlyne Russian, MD  gabapentin (NEURONTIN) 300  MG capsule TAKE 1 CAPSULE BY MOUTH ONCE EVERY MORNING AND THEN TAKE 2 CAPSULES BY MOUTH EVERY EVENING 01/19/15  Yes Darlyne Russian, MD  hydrochlorothiazide (MICROZIDE) 12.5 MG capsule take 1 capsule by mouth once daily 12/12/14  Yes Darlyne Russian, MD  ibuprofen (ADVIL,MOTRIN) 200 MG tablet Take 400 mg by mouth every 6 (six) hours as needed for headache.   Yes Historical Provider, MD  lisinopril (PRINIVIL,ZESTRIL) 10 MG tablet Take 1 tablet (10 mg total) by mouth daily. 11/20/14  Yes Darlyne Russian, MD  metFORMIN (GLUCOPHAGE-XR) 500 MG 24 hr tablet Take 1 tablet (500 mg total) by mouth 2 (two) times daily. 01/14/15  Yes Darlyne Russian, MD  metoprolol (LOPRESSOR) 100 MG tablet TAKE 1 TABLET BY MOUTH TWICE A DAY 01/15/15  Yes Darlyne Russian, MD  nitroGLYCERIN (NITROSTAT) 0.4 MG SL tablet Place one under the tongue as needed for chest pain. Repeat in 5 minutes if no effect. Call 911 if you need to take the medication 10/05/13  Yes Darlyne Russian, MD  zoster vaccine live, PF, (ZOSTAVAX) 29562 UNT/0.65ML injection Inject 19,400 Units into the skin once. Patient not taking: Reported on 04/09/2015 10/07/14   Darlyne Russian, MD     ROS: The patient has voice change, numbness, sleep disturbance.  He denies somnolence.   All other systems have been reviewed and were otherwise negative with the exception of those mentioned in the HPI and as above.    PHYSICAL EXAM: Filed Vitals:   04/09/15 1633  BP: 110/68  Pulse: 60  Temp: 98.1 F (36.7 C)  Resp: 16   Body mass index is 28.12 kg/(m^2).   General: Alert, no acute distress HEENT:  Normocephalic, atraumatic, oropharynx patent. Throat is normal. Oropharynx is normal. Hoarseness to his voice. Eye: [name] [facility] Cardiovascular:  Regular rate and rhythm, no rubs murmurs or gallops.  No Carotid bruits, radial pulse intact. No pedal edema.  Respiratory: Clear to auscultation bilaterally.  No wheezes, rales, or rhonchi.  No cyanosis, no use of accessory  musculature Abdominal: No organomegaly, abdomen is soft and non-tender, positive bowel sounds.  No masses. Musculoskeletal: Gait intact. No edema, tenderness Skin: No rashes. Neurologic: Facial musculature symmetric. Decreased sensation to the bottom of both feet.  Psychiatric: Patient acts appropriately throughout our interaction. Lymphatic: No cervical or submandibular lymphadenopathy.    LABS:  Results for orders placed or performed in visit on [id number]  BASIC METABOLIC PANEL WITH GFR  Result Value Ref Range   Sodium 138 135 - 146 mmol/L   Potassium 4.4 3.5 - 5.3 mmol/L   Chloride 104 98 - 110 mmol/L   CO2 24 20 - 31 mmol/L   Glucose, Bld 147 (H) 65 - 99 mg/dL   BUN 18 7 - 25 mg/dL   Creat 1.09 0.70 - 1.25 mg/dL   Calcium 9.1 8.6 - 10.3 mg/dL   GFR, Est African American 84 >=60 mL/min   GFR, Est Non African American 72 >=60 mL/min  Lipid panel  Result Value Ref Range   Cholesterol 128 125 - 200 mg/dL   Triglycerides 114 <150 mg/dL   HDL 37 (L) >=40 mg/dL   Total CHOL/HDL Ratio 3.5 <=5.0 Ratio   VLDL 23 <30 mg/dL   LDL Cholesterol 68 <130 mg/dL  POCT glucose (manual entry)  Result Value Ref Range   POC Glucose 155 (A) 70 - 99 mg/dl  POCT glycosylated hemoglobin (Hb A1C)  Result Value Ref Range   Hemoglobin A1C 7.2     EKG/XRAY:   Primary read interpreted by Dr. Everlene Farrier at [facility].   ASSESSMENT/PLAN:   I did not change his diabetes medicine at the present time. I will work with the patient to work harder on diet and increase his exercise. I did increase his Neurontin to take 1 in the morning and 1 at about 2:00 in the afternoon to help with his worsening peripheral neuropathy.he does have some hoarseness which is probably allergy triggered. He states it will only last for an hour to and then resolves. He denies any chronic hoarseness. He had a chest x-[place] about 1-1/2 years ago. I also advised him to take a multivitamin one a day.I personally performed the services described  in this documentation, which was scribed in my presence. The recorded information has been reviewed and is accurate.  Gross sideeffects, risk and benefits, and alternatives of medications d/w patient. Patient is aware that all medications have potential sideeffects and we are unable to predict every sideeffect or drug-drug interaction that may occur.  Arlyss Queen MD 04/09/2015 4:40 PM

## 2015-04-09 NOTE — Patient Instructions (Addendum)
I have increased gabapentin  to help with your neuropathy. Please work harder on diet and exercise. Your hemoglobin A1c is up to 7.2. We'll repeat in 4 months.

## 2015-04-15 ENCOUNTER — Other Ambulatory Visit (HOSPITAL_COMMUNITY): Payer: Self-pay | Admitting: Psychiatry

## 2015-04-19 ENCOUNTER — Other Ambulatory Visit: Payer: Self-pay | Admitting: Emergency Medicine

## 2015-04-20 ENCOUNTER — Other Ambulatory Visit: Payer: Self-pay | Admitting: Emergency Medicine

## 2015-05-02 ENCOUNTER — Other Ambulatory Visit: Payer: Self-pay | Admitting: Emergency Medicine

## 2015-05-17 ENCOUNTER — Other Ambulatory Visit: Payer: Self-pay | Admitting: Emergency Medicine

## 2015-06-11 ENCOUNTER — Other Ambulatory Visit: Payer: Self-pay | Admitting: Emergency Medicine

## 2015-06-19 IMAGING — CR DG KNEE COMPLETE 4+V*R*
4 series · 4 of 4 positions shown · non-contrast
Comparison: None.

CLINICAL DATA: Chronic right knee pain. No known injury. Initial
encounter.

EXAM:
RIGHT KNEE - COMPLETE 4+ VIEW

[AP]
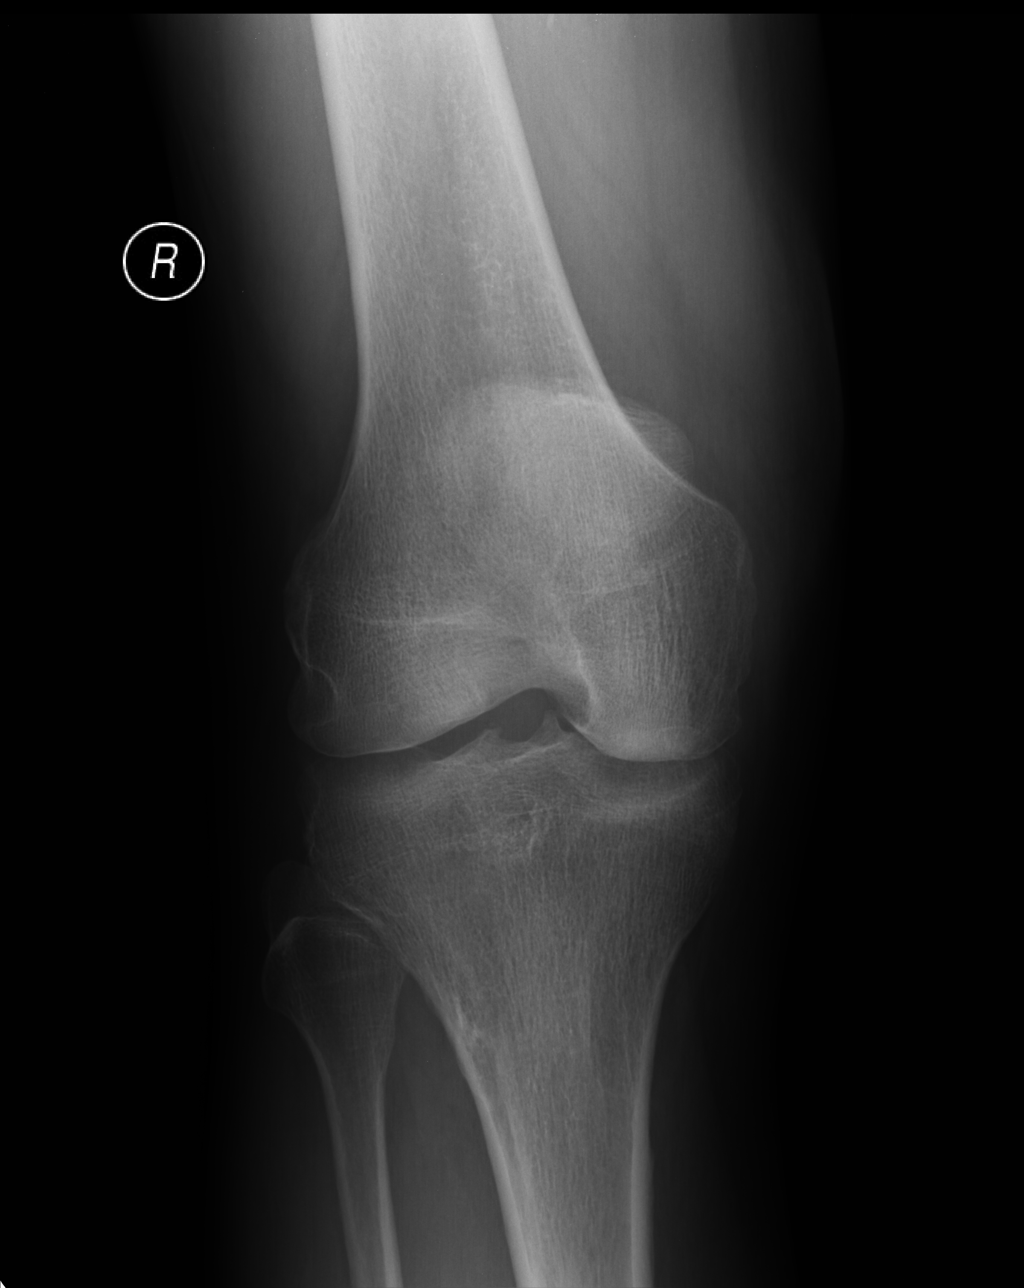

[ap axial]
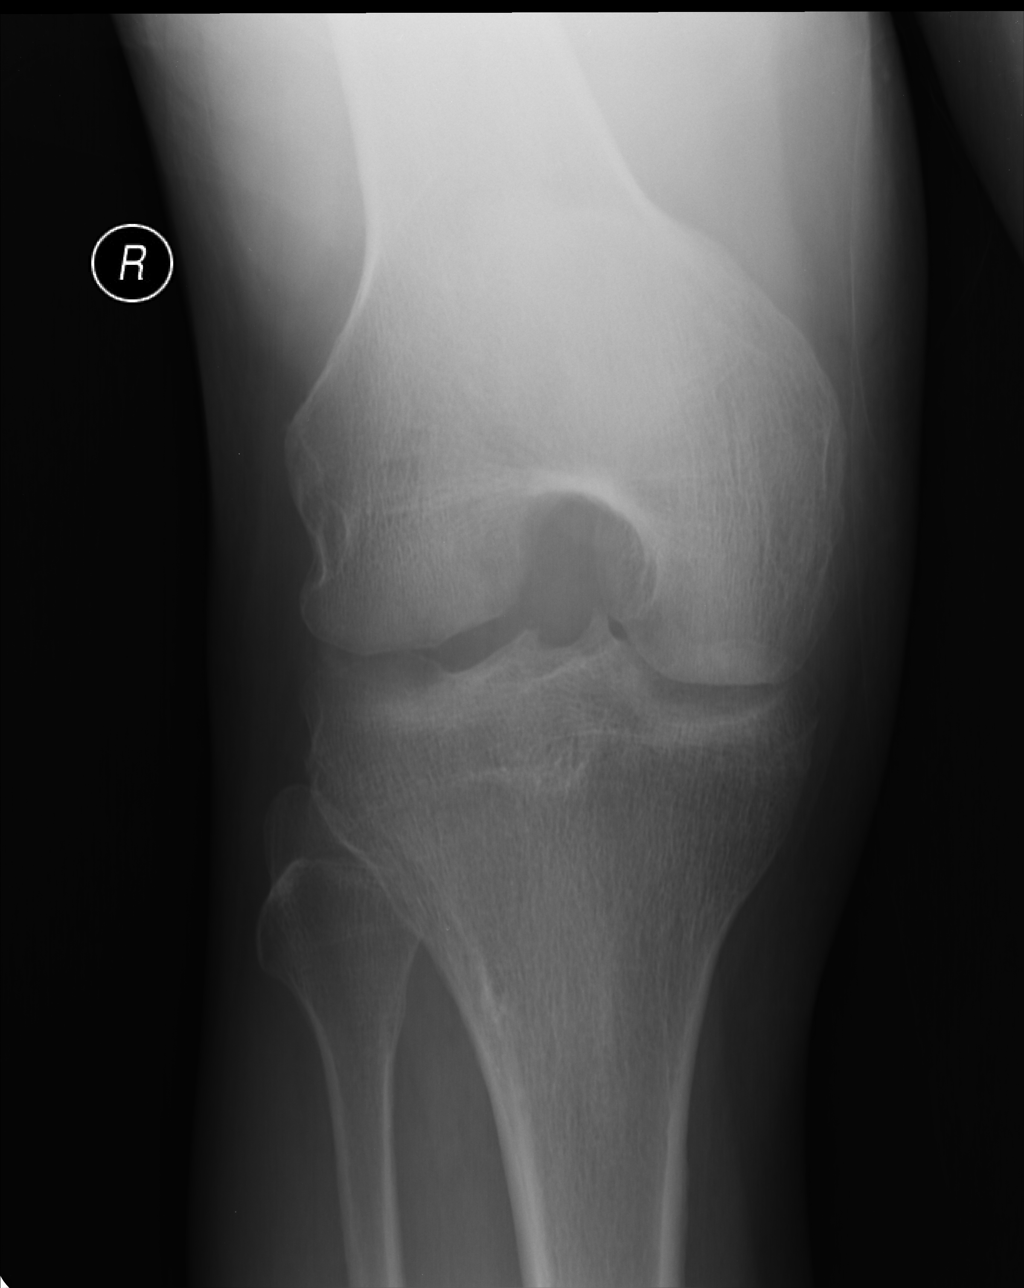

[lateral]
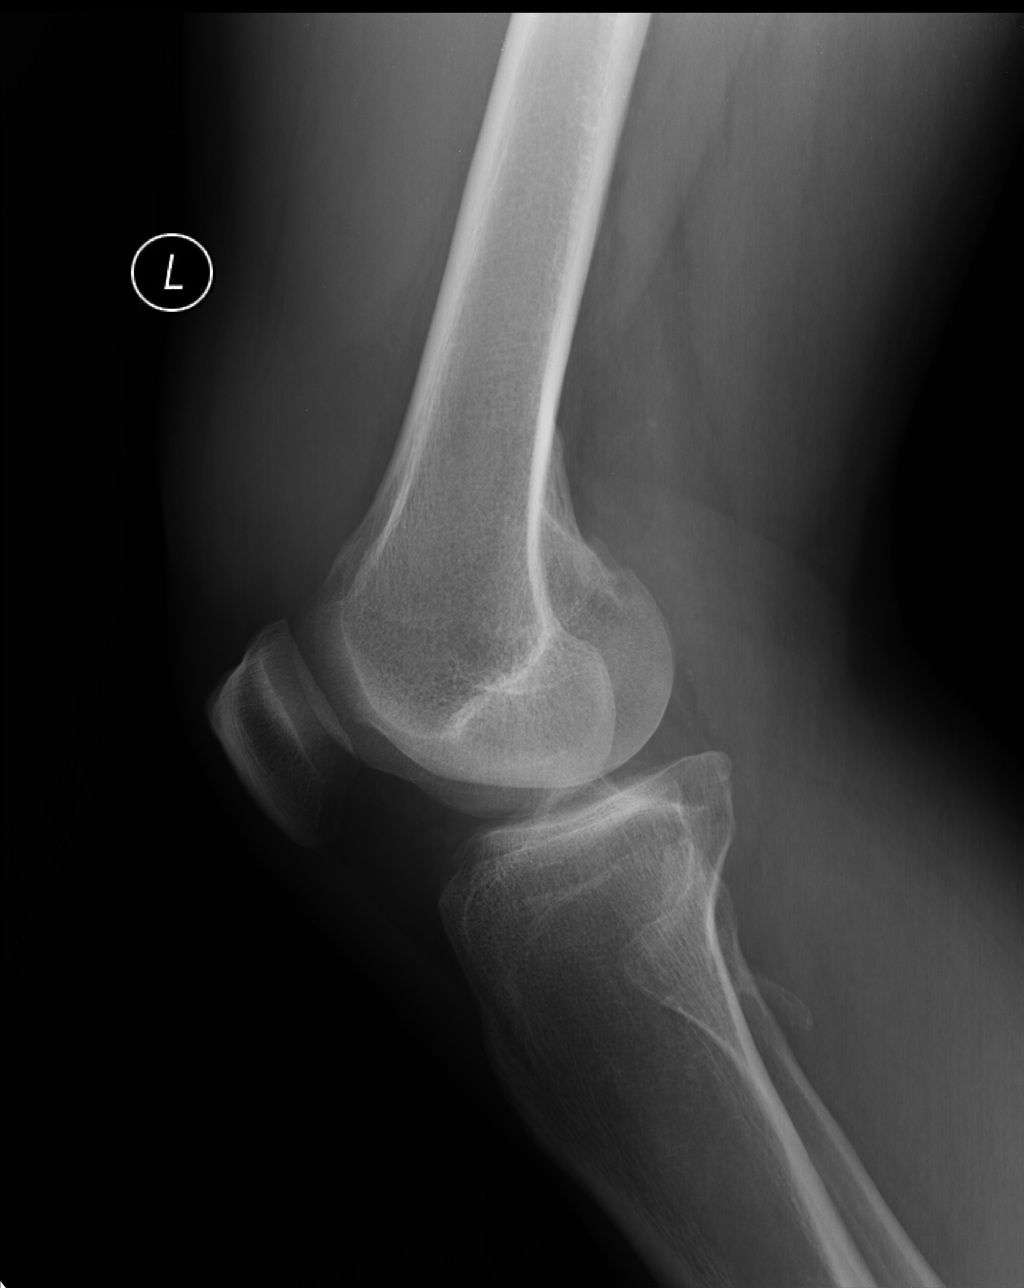

[sunrise]
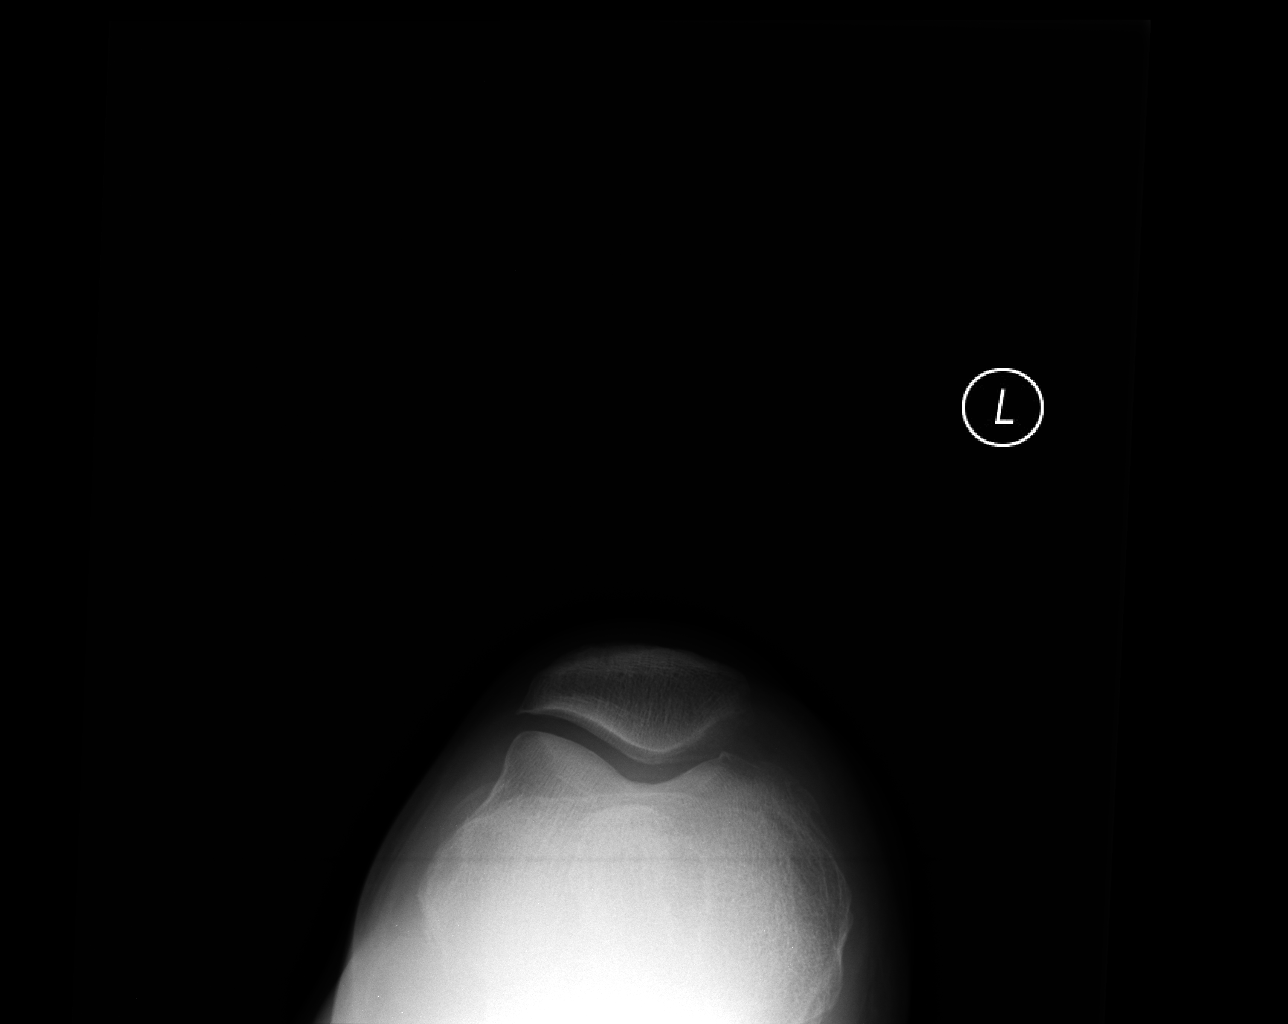

[4 of 4 positions shown; findings below may reference images not displayed]

FINDINGS: No fracture is identified. Moderate to moderately large joint
effusion is seen. Small osteophytes are present about the medial and
lateral compartments. Pedunculated osteochondroma off the proximal
fibula is noted.
IMPRESSION: Moderate joint effusion.

Mild appearing degenerative change.

Osteochondroma proximal fibula.

## 2015-08-06 ENCOUNTER — Other Ambulatory Visit: Payer: Self-pay | Admitting: Emergency Medicine

## 2015-08-10 ENCOUNTER — Other Ambulatory Visit: Payer: Self-pay | Admitting: Emergency Medicine

## 2015-08-13 ENCOUNTER — Ambulatory Visit (INDEPENDENT_AMBULATORY_CARE_PROVIDER_SITE_OTHER): Payer: 59 | Admitting: Emergency Medicine

## 2015-08-13 ENCOUNTER — Encounter: Payer: Self-pay | Admitting: Emergency Medicine

## 2015-08-13 VITALS — BP 116/62 | HR 54 | Temp 98.2°F | Resp 16 | Ht 69.0 in | Wt 188.8 lb

## 2015-08-13 DIAGNOSIS — E785 Hyperlipidemia, unspecified: Secondary | ICD-10-CM | POA: Diagnosis not present

## 2015-08-13 DIAGNOSIS — R490 Dysphonia: Secondary | ICD-10-CM

## 2015-08-13 DIAGNOSIS — I10 Essential (primary) hypertension: Secondary | ICD-10-CM

## 2015-08-13 DIAGNOSIS — D229 Melanocytic nevi, unspecified: Secondary | ICD-10-CM | POA: Diagnosis not present

## 2015-08-13 DIAGNOSIS — Z125 Encounter for screening for malignant neoplasm of prostate: Secondary | ICD-10-CM

## 2015-08-13 DIAGNOSIS — L570 Actinic keratosis: Secondary | ICD-10-CM | POA: Diagnosis not present

## 2015-08-13 DIAGNOSIS — E1140 Type 2 diabetes mellitus with diabetic neuropathy, unspecified: Secondary | ICD-10-CM | POA: Diagnosis not present

## 2015-08-13 DIAGNOSIS — E139 Other specified diabetes mellitus without complications: Secondary | ICD-10-CM

## 2015-08-13 DIAGNOSIS — Z1159 Encounter for screening for other viral diseases: Secondary | ICD-10-CM

## 2015-08-13 LAB — LIPID PANEL
Cholesterol: 112 mg/dL — ABNORMAL LOW (ref 125–200)
HDL: 46 mg/dL (ref >=40)
LDL Cholesterol: 51 mg/dL (ref <130)
Total CHOL/HDL Ratio: 2.4 Ratio (ref <=5.0)
Triglycerides: 75 mg/dL (ref <150)
VLDL: 15 mg/dL (ref <30)

## 2015-08-13 LAB — BASIC METABOLIC PANEL WITH GFR
BUN: 21 mg/dL (ref 7–25)
CO2: 24 mmol/L (ref 20–31)
Calcium: 8.9 mg/dL (ref 8.6–10.3)
Chloride: 105 mmol/L (ref 98–110)
Creat: 1.09 mg/dL (ref 0.70–1.25)
GFR, Est African American: 84 mL/min (ref >=60)
GFR, Est Non African American: 72 mL/min (ref >=60)
Glucose, Bld: 124 mg/dL — ABNORMAL HIGH (ref 65–99)
Potassium: 4.5 mmol/L (ref 3.5–5.3)
Sodium: 138 mmol/L (ref 135–146)

## 2015-08-13 LAB — HEPATITIS C ANTIBODY: HCV Ab: NEGATIVE

## 2015-08-13 LAB — POCT GLYCOSYLATED HEMOGLOBIN (HGB A1C): Hemoglobin A1C: 6.9

## 2015-08-13 LAB — GLUCOSE, POCT (MANUAL RESULT ENTRY): POC Glucose: 132 mg/dl — AB (ref 70–99)

## 2015-08-13 NOTE — Progress Notes (Addendum)
By signing my name below, I, Mesha Guinyard, attest that this documentation has been prepared under the direction and in the presence of Arlyss Queen, MD.  Electronically Signed: Verlee Monte, Medical Scribe. 08/13/2015. 11:20 AM.  Chief Complaint:  Chief Complaint  Patient presents with  . Follow-up  . Diabetes    feet numbness    HPI: Seth Pope is a 65 y.o. male with a PMHx of DM who reports to [facility] today for follow up. Pt states the numbness in his feet has increased about 20% since his last office visit to [facility]. Pt reports the lower half of his foot by the heel is numb. Pt states his foot is currently tingling. Pt woke up with pain in the ball of his foot after frequently climbing a ladder at work with the ball of his foot. Pt described it as walking with a board under the ball of his feet, or a balled up sock under the balls of his feet while he walks. Pt states this occasionally happened and he can hardly walk when this occurs. Pain is relived when he walks. Pt states he has a cushion in his shoes to give relief to his back problems. Pt hardly walks barefoot.  Dermatology: Pt reports lumps on his forearm and on the top of his ear. Pt wears works outside a lot and wears a cap. Pt plans on going to a dermatologist for this.  Voice Hoarseness: Pt states his voice hoarseness hasn't worsened. Pt gets heartburn when he eats something out of the ordinary. Pt takes rolaids for relief. Pt reports he occasionally has some irritation in his throat. Pt drinks water to relieve his symptoms.  Pt was reffered to Dr. Collene Mares for his colonoscopy- his last was in 2004.  Past Medical History  Diagnosis Date  . Hypertension   . Diabetes mellitus   . Hyperlipidemia   . Chronic kidney disease     h/o kidney stone  . Ulcer   . Umbilical hernia   . CAD (coronary artery disease)     a. cath 10/23/13: nonobstructive CAD, nl LV function, rec risk factor modification  . Sinus bradycardia     a. as low as  36 bpm; b. multiple pauses 2.1-2.3 seconds   Past Surgical History  Procedure Laterality Date  . Knee surgery  1970  . Rotator cuff repair  1991    x2  . Umbilical hernia repair    . Back surgery    . Nm myocar perf wall motion  08/2010    bruce myoview - normal perfusion in all regions, EF 66%, EKG negative for ischemia, no wall motion abnormalities, normal/low risk study                                       . Left heart catheterization with coronary angiogram N/A 10/23/2013    Procedure: LEFT HEART CATHETERIZATION WITH CORONARY ANGIOGRAM;  Surgeon: Peter M Martinique, MD;  Location: [facility] CATH LAB;  Service: Cardiovascular;  Laterality: N/A;   Social History   Social History  . Marital Status: Married    Spouse Name: N/A  . Number of Children: 2  . Years of Education: 10th    Occupational History  . self-employed (Investment banker, operational)    Social History Main Topics  . Smoking status: Former Smoker -- 2.50 packs/day for 18 years    Types: Cigarettes  Quit date: 07/16/1986  . Smokeless tobacco: None  . Alcohol Use: Yes     Comment: social  . Drug Use: No     Comment: former   . Sexual Activity: Not Asked   Other Topics Concern  . None   Social History Narrative   Family History  Problem Relation Age of Onset  . Diabetes Sister     HTN  . Diabetes Brother     HTN, heart problems  . Heart attack Father   . Hypertension Father   . Heart disease Father   . Hypertension    . Diabetes Paternal Grandmother   . Heart disease Paternal Grandfather    Allergies  Allergen Reactions  . Penicillins Rash   Prior to Admission medications   Medication Sig Start Date End Date Taking? Authorizing Provider  ALPRAZolam (XANAX XR) 1 MG 24 hr tablet Take 1 mg by mouth daily.    Historical Provider, MD  aspirin 81 MG tablet Take 81 mg by mouth every morning.     Historical Provider, MD  atorvastatin (LIPITOR) 80 MG tablet take 1 tablet by mouth once daily 06/16/15   Darlyne Russian, MD    cyclobenzaprine (FLEXERIL) 5 MG tablet Take 1 tablet (5 mg total) by mouth 3 (three) times daily as needed for muscle spasms. 05/27/14   Darlyne Russian, MD  desipramine (NORPRAMIN) 50 MG tablet Take 150 mg by mouth at bedtime.     Historical Provider, MD  fenofibrate 54 MG tablet take 1 tablet by mouth once daily 05/19/15   Darlyne Russian, MD  gabapentin (NEURONTIN) 300 MG capsule Take 1 in the morning and one in the early afternoon for your neuropathy. 04/09/15   Darlyne Russian, MD  hydrochlorothiazide (MICROZIDE) 12.5 MG capsule take 1 capsule by mouth once daily 06/16/15   Darlyne Russian, MD  ibuprofen (ADVIL,MOTRIN) 200 MG tablet Take 400 mg by mouth every 6 (six) hours as needed for headache.    Historical Provider, MD  lisinopril (PRINIVIL,ZESTRIL) 10 MG tablet take 1 tablet by mouth once daily 08/10/15   Darlyne Russian, MD  metFORMIN (GLUCOPHAGE-XR) 500 MG 24 hr tablet take 1 tablet by mouth twice a day 08/10/15   Darlyne Russian, MD  metoprolol (LOPRESSOR) 100 MG tablet take 1 tablet by mouth twice a day 08/07/15   Darlyne Russian, MD  nitroGLYCERIN (NITROSTAT) 0.4 MG SL tablet Place one under the tongue as needed for chest pain. Repeat in 5 minutes if no effect. Call 911 if you need to take the medication 10/05/13   Darlyne Russian, MD  zoster vaccine live, PF, (ZOSTAVAX) 28413 UNT/0.65ML injection Inject 19,400 Units into the skin once. Patient not taking: Reported on 04/09/2015 10/07/14   Darlyne Russian, MD     ROS: The patient denies fevers, chills, night sweats, unintentional weight loss, chest pain, palpitations, wheezing, dyspnea on exertion, nausea, vomiting, abdominal pain, dysuria, hematuria, melena, or weakness. Pt reports numbness, and tingling.  All other systems have been reviewed and were otherwise negative with the exception of those mentioned in the HPI and as above.    PHYSICAL EXAM: Filed Vitals:   08/13/15 1112  BP: 116/62  Pulse: 54  Temp: 98.2 F (36.8 C)  Resp: 16   Body mass  index is 27.87 kg/(m^2).   General: Alert, no acute distress HEENT:  Normocephalic, atraumatic, oropharynx patent. Eye: [name] [facility] Cardiovascular:  Regular rate and rhythm, no rubs murmurs or gallops.  No Carotid bruits, radial pulse intact. No pedal edema.  Respiratory: Clear to auscultation bilaterally.  No wheezes, rales, or rhonchi.  No cyanosis, no use of accessory musculature Abdominal: No organomegaly, abdomen is soft and non-tender, positive bowel sounds.  No masses. Musculoskeletal: Gait intact. No edema, tenderness Skin: No rashes. Neurologic: Facial musculature symmetric. Psychiatric: Patient acts appropriately throughout our interaction. Lymphatic: No cervical or submandibular lymphadenopathy GU: Slightly firm but symmetrical, no nodularity   LABS:  Results for orders placed or performed in visit on 08/13/15  POCT glucose (manual entry)  Result Value Ref Range   POC Glucose 132 (A) 70 - 99 mg/dl  POCT glycosylated hemoglobin (Hb A1C)  Result Value Ref Range   Hemoglobin A1C 6.9     EKG/XRAY:   Primary read interpreted by Dr. Everlene Farrier at [facility].   ASSESSMENT/PLAN: Hemoglobin A1c is better. He is working harder on diet. I did send off his PSA today. He does seem hoarse to me and I made a referral to ENT for laryngoscopy. Referral made to dermatology because of actinic keratosis over both arms. Referral also made to neurology to get help with his peripheral neuropathy.I personally performed the services described in this documentation, which was scribed in my presence. The recorded information has been reviewed and is accurate. Hep C screening was also done.   Gross sideeffects, risk and benefits, and alternatives of medications d/w patient. Patient is aware that all medications have potential sideeffects and we are unable to predict every sideeffect or drug-drug interaction that may occur.  Arlyss Queen MD 08/13/2015 11:20 AM

## 2015-08-14 LAB — PSA: PSA: 1.57 ng/mL

## 2015-09-10 ENCOUNTER — Encounter: Payer: Self-pay | Admitting: Neurology

## 2015-09-10 ENCOUNTER — Ambulatory Visit (INDEPENDENT_AMBULATORY_CARE_PROVIDER_SITE_OTHER): Payer: 59 | Admitting: Neurology

## 2015-09-10 ENCOUNTER — Ambulatory Visit: Payer: Self-pay | Admitting: Otolaryngology

## 2015-09-10 VITALS — BP 128/78 | HR 60 | Ht 69.0 in | Wt 190.0 lb

## 2015-09-10 DIAGNOSIS — E1349 Other specified diabetes mellitus with other diabetic neurological complication: Secondary | ICD-10-CM

## 2015-09-10 MED ORDER — GABAPENTIN 300 MG PO CAPS: 300.0000 mg | ORAL_CAPSULE | Freq: Three times a day (TID) | ORAL | 1 refills | Status: DC

## 2015-09-10 NOTE — H&P (Signed)
Seth Pope is a 65 y.o. male who presents as a consult Patient.   Referring Provider: Deveron Furlong  Chief complaint: Hoarseness.  HPI: Intermittent hoarseness for about a year. He denies sore throat, trouble swallowing, trouble breathing. He denies heartburn. He suffered with diabetes but otherwise fairly healthy. He denies any cardiac history.  PMH/Meds/All/SocHx/FamHx/ROS:   Past Medical History:  Diagnosis Date  . CAD (coronary artery disease)  . Diabetes mellitus ([place])  . Hyperlipidemia  . Hypertension   Past Surgical History:  Procedure Laterality Date  . BACK SURGERY  . CARDIAC CATHETERIZATION  . HERNIA REPAIR  . KNEE SURGERY  . ROTATOR CUFF REPAIR   No family history of bleeding disorders, wound healing problems or difficulty with anesthesia.   Social History   Social History  . Marital status: Unknown  Spouse name: N/A  . Number of children: N/A  . Years of education: N/A   Occupational History  . Not on file.   Social History Main Topics  . Smoking status: Former Research scientist (life sciences)  . Smokeless tobacco: Never Used  Comment: 1989  . Alcohol use No  . Drug use: Not on file  . Sexual activity: Not on file   Other Topics Concern  . Not on file   Social History Narrative  . No narrative on file   Current Outpatient Prescriptions:  . ALPRAZolam (XANAX XR) 1 MG 24 hr tablet, Take by mouth., Disp: , Rfl:  . aspirin 81 MG EC tablet *ANTIPLATELET*, Take by mouth., Disp: , Rfl:  . atorvastatin (LIPITOR) 80 MG tablet, take 1 tablet by mouth once daily, Disp: , Rfl:  . cyclobenzaprine (FLEXERIL) 5 MG tablet, Take by mouth., Disp: , Rfl:  . desipramine (NORPRAMIN) 50 MG tablet, Take by mouth., Disp: , Rfl:  . fenofibrate (LOFIBRA) 54 MG tablet, take 1 tablet by mouth once daily, Disp: , Rfl:  . gabapentin (NEURONTIN) 300 MG capsule, Take 1 in the morning and one in the early afternoon for your neuropathy., Disp: , Rfl:  . hydroCHLOROthiazide (MICROZIDE) 12.5 mg  capsule, take 1 capsule by mouth once daily, Disp: , Rfl:  . ibuprofen (ADVIL,MOTRIN) 200 MG tablet, Take by mouth., Disp: , Rfl:  . lisinopril (PRINIVIL,ZESTRIL) 10 MG tablet, take 1 tablet by mouth once daily, Disp: , Rfl:  . metFORMIN (GLUCOPHAGE-XR) 500 MG 24 hr tablet, take 1 tablet by mouth twice a day, Disp: , Rfl:  . metoPROLOL tartrate (LOPRESSOR) 100 MG tablet, take 1 tablet by mouth twice a day, Disp: , Rfl:  . nitroglycerin (NITROSTAT) 0.4 MG SL tablet, Place one under the tongue as needed for chest pain. Repeat in 5 minutes if no effect. Call 911 if you need to take the medication, Disp: , Rfl:  . zoster vaccine live, PF, (ZOSTAVAX) 19,400 unit/0.65 mL injection, Inject 19,400 Units into the skin., Disp: , Rfl:   A complete ROS was performed with pertinent positives/negatives noted in the HPI. The remainder of the ROS are negative.   Physical Exam:   BP 128/76  Ht 1.765 m (5' 9.5")  Wt 82.6 kg (182 lb)  BMI 26.49 kg/m2  General: Healthy and alert, in no distress, breathing easily. Normal affect. In a pleasant mood. Head: Normocephalic, atraumatic. No masses, or scars. Eyes: Pupils are equal, and reactive to light. Vision is grossly intact. No spontaneous or gaze nystagmus. Ears: Ear canals are clear. Tympanic membranes are intact, with normal landmarks and the middle ears are clear and healthy. Hearing: Grossly normal. Nose: Nasal  cavities are clear with healthy mucosa, no polyps or exudate.Airways are patent. Face: No masses or scars, facial nerve function is symmetric. Oral Cavity: No mucosal abnormalities are noted. Tongue with normal mobility. Dentition appears healthy. Oropharynx: Tonsils are symmetric. There are no mucosal masses identified. Tongue base appears normal and healthy.  Larynx/Hypopharynx: indirect exam reveals healthy, mobile vocal cords, without mucosal lesions in the hypopharynx or larynx. the only abnormal finding is a tiny papillomatous lesion involving  the midportion of the left vocal cord at the contacting surface. Chest: Deferred Neck: No palpable masses, no cervical adenopathy, no thyroid nodules or enlargement. Neuro: Cranial nerves II-XII will normal function. Balance: Normal gate. Other findings: none.  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 Recommend microlaryngoscopy with excision. Risks and benefits were discussed. We will schedule at his convenience.

## 2015-09-10 NOTE — Progress Notes (Signed)
Reason for visit: Peripheral neuropathy  Referring physician: Dr. [place] Sink is a 65 y.o. male  History of present illness:  Seth Pope is a 65 year old right-handed white male with a history of diabetes. Within the last 8-10 months, he has developed some discomfort in the balls of the feet bilaterally. The pain can be sharp at times, but often times feels as if there is a ball underneath the foot. The patient has more discomfort when he walks barefoot, he finds that tennis shoes are the best for him to control the pain. The patient has had prior lumbosacral spine surgery, but he denies pain in the back or pain down the legs. The patient denies any balance issues, he denies any falls. He denies weakness of the legs. The pain is more significant when he is up on his feet, but still hurts some off of his feet, the pain does not keep him awake at night. He denies any issues controlling the bowels or the bladder. He has been placed on gabapentin, currently taking 300 mg twice daily. The pain still is not well controlled. The patient is sent to this office for an evaluation. The patient does report some true numbness and tingling in the feet. Mainly the distal portions of the foot are affected.  Past Medical History:  Diagnosis Date  . CAD (coronary artery disease)    a. cath 10/23/13: nonobstructive CAD, nl LV function, rec risk factor modification  . Chronic kidney disease    h/o kidney stone  . Diabetes mellitus   . Hyperlipidemia   . Hypertension   . Sinus bradycardia    a. as low as 36 bpm; b. multiple pauses 2.1-2.3 seconds  . Ulcer   . Umbilical hernia     Past Surgical History:  Procedure Laterality Date  . BACK SURGERY    . KNEE SURGERY  1970  . LEFT HEART CATHETERIZATION WITH CORONARY ANGIOGRAM N/A 10/23/2013   Procedure: LEFT HEART CATHETERIZATION WITH CORONARY ANGIOGRAM;  Surgeon: Peter M Martinique, MD;  Location: [facility] CATH LAB;  Service: Cardiovascular;  Laterality: N/A;  .  NM MYOCAR PERF WALL MOTION  08/2010   bruce myoview - normal perfusion in all regions, EF 66%, EKG negative for ischemia, no wall motion abnormalities, normal/low risk study                                       . [place]   x2  . UMBILICAL HERNIA REPAIR      Family History  Problem Relation Age of Onset  . Diabetes Sister     HTN  . Diabetes Brother     HTN, heart problems  . Heart attack Father   . Hypertension Father   . Heart disease Father   . Hypertension    . Diabetes Paternal Grandmother   . Heart disease Paternal Grandfather     Social history:  reports that he quit smoking about 29 years ago. His smoking use included Cigarettes. He has a 45.00 pack-year smoking history. He does not have any smokeless tobacco history on file. He reports that he drinks alcohol. He reports that he does not use drugs.  Medications:  Prior to Admission medications   Medication Sig Start Date End Date Taking? Authorizing Provider  ALPRAZolam (XANAX XR) 1 MG 24 hr tablet Take 1 mg by mouth daily.  Historical Provider, MD  aspirin 81 MG tablet Take 81 mg by mouth every morning.     Historical Provider, MD  atorvastatin (LIPITOR) 80 MG tablet take 1 tablet by mouth once daily 06/16/15   Darlyne Russian, MD  cyclobenzaprine (FLEXERIL) 5 MG tablet Take 1 tablet (5 mg total) by mouth 3 (three) times daily as needed for muscle spasms. 05/27/14   Darlyne Russian, MD  desipramine (NORPRAMIN) 50 MG tablet Take 150 mg by mouth at bedtime.     Historical Provider, MD  fenofibrate 54 MG tablet take 1 tablet by mouth once daily 05/19/15   Darlyne Russian, MD  gabapentin (NEURONTIN) 300 MG capsule Take 1 in the morning and one in the early afternoon for your neuropathy. 04/09/15   Darlyne Russian, MD  hydrochlorothiazide (MICROZIDE) 12.5 MG capsule take 1 capsule by mouth once daily 06/16/15   Darlyne Russian, MD  ibuprofen (ADVIL,MOTRIN) 200 MG tablet Take 400 mg by mouth every 6 (six) hours as needed for  headache.    Historical Provider, MD  lisinopril (PRINIVIL,ZESTRIL) 10 MG tablet take 1 tablet by mouth once daily 08/10/15   Darlyne Russian, MD  metFORMIN (GLUCOPHAGE-XR) 500 MG 24 hr tablet take 1 tablet by mouth twice a day 08/10/15   Darlyne Russian, MD  metoprolol (LOPRESSOR) 100 MG tablet take 1 tablet by mouth twice a day 08/07/15   Darlyne Russian, MD  nitroGLYCERIN (NITROSTAT) 0.4 MG SL tablet Place one under the tongue as needed for chest pain. Repeat in 5 minutes if no effect. Call 911 if you need to take the medication 10/05/13   Darlyne Russian, MD  zoster vaccine live, PF, (ZOSTAVAX) 60454 UNT/0.65ML injection Inject 19,400 Units into the skin once. Patient not taking: Reported on 04/09/2015 10/07/14   Darlyne Russian, MD      Allergies  Allergen Reactions  . Penicillins Rash    ROS:  Out of a complete 14 system review of symptoms, the patient complains only of the following symptoms, and all other reviewed systems are negative.  Fatigue Restless legs Moles  There were no vitals taken for this visit.  Physical Exam  General: The patient is alert and cooperative at the time of the examination.  Eyes: Pupils are equal, round, and reactive to light. Discs are flat bilaterally.  Neck: The neck is supple, no carotid bruits are noted.  Respiratory: The respiratory examination is clear.  Cardiovascular: The cardiovascular examination reveals a regular rate and rhythm, no obvious murmurs or rubs are noted.  Skin: Extremities are without significant edema.  Neurologic Exam  Mental status: The patient is alert and oriented x 3 at the time of the examination. The patient has apparent normal recent and remote memory, with an apparently normal attention span and concentration ability.  Cranial nerves: Facial symmetry is present. There is good sensation of the face to pinprick and soft touch bilaterally. The strength of the facial muscles and the muscles to head turning and shoulder shrug  are normal bilaterally. Speech is well enunciated, no aphasia or dysarthria is noted. Extraocular movements are full. Visual fields are full. The tongue is midline, and the patient has symmetric elevation of the soft palate. No obvious hearing deficits are noted.  Motor: The motor testing reveals 5 over 5 strength of all 4 extremities. Good symmetric motor tone is noted throughout.  Sensory: Sensory testing is intact to pinprick, soft touch, vibration sensation, and position sense on all  4 extremities, with the exception of a mild stocking pattern pinprick deficit in the distal half of the feet bilaterally. No evidence of extinction is noted.  Coordination: Cerebellar testing reveals good finger-nose-finger and heel-to-shin bilaterally.  Gait and station: Gait is normal. Tandem gait is normal. Romberg is negative. No drift is seen.  Reflexes: Deep tendon reflexes are symmetric and normal bilaterally. The ankle jerk reflexes are well-maintained bilaterally Toes are downgoing bilaterally.   Assessment/Plan:  1. 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 He likely has a very early peripheral neuropathy or a primarily small fiber neuropathy. The patient will be set up for blood work today, he will have nerve conduction studies of both legs to include plantar sensory latencies. EMG study will be done on one leg. He will be increased on the gabapentin taking 300 mg 3 times daily. He will follow-up for the EMG evaluation.  Seth Alexanders MD 09/10/2015 1:36 PM  [facility] [address] [place], [place] [address]  Phone [phone] Fax [phone]

## 2015-09-10 NOTE — Patient Instructions (Addendum)

## 2015-09-12 ENCOUNTER — Other Ambulatory Visit: Payer: Self-pay | Admitting: Emergency Medicine

## 2015-09-14 LAB — MULTIPLE MYELOMA PANEL, SERUM
Albumin SerPl Elph-Mcnc: 4.1 g/dL (ref 2.9–4.4)
Albumin/Glob SerPl: 1.5 (ref 0.7–1.7)
Alpha 1: 0.2 g/dL (ref 0.0–0.4)
Alpha2 Glob SerPl Elph-Mcnc: 0.6 g/dL (ref 0.4–1.0)
B-Globulin SerPl Elph-Mcnc: 1 g/dL (ref 0.7–1.3)
Gamma Glob SerPl Elph-Mcnc: 1 g/dL (ref 0.4–1.8)
Globulin, Total: 2.8 g/dL (ref 2.2–3.9)
IgA/Immunoglobulin A, Serum: 103 mg/dL (ref 61–437)
IgG (Immunoglobin G), Serum: 984 mg/dL (ref 700–1600)
IgM (Immunoglobulin M), Srm: 76 mg/dL (ref 20–172)
Total Protein: 6.9 g/dL (ref 6.0–8.5)

## 2015-09-14 LAB — ANA W/REFLEX: Anti Nuclear Antibody(ANA): NEGATIVE

## 2015-09-14 LAB — B. BURGDORFI ANTIBODIES: Lyme IgG/IgM Ab: <0.91 {ISR} (ref 0.00–0.90)

## 2015-09-14 LAB — RHEUMATOID FACTOR: Rhuematoid fact SerPl-aCnc: <10 IU/mL (ref 0.0–13.9)

## 2015-09-14 LAB — ANGIOTENSIN CONVERTING ENZYME: Angio Convert Enzyme: <15 U/L (ref 14–82)

## 2015-09-14 LAB — VITAMIN B12: Vitamin B-12: 468 pg/mL (ref 211–946)

## 2015-09-17 ENCOUNTER — Encounter (HOSPITAL_COMMUNITY): Payer: Self-pay

## 2015-09-17 NOTE — Pre-Procedure Instructions (Signed)
Seth Pope  09/17/2015      [facility] [phone] - [address], [address] [place] [address] Phone: [phone] Fax: [phone]  [facility] [id number], [place] [address] Phone: [phone] Fax: [phone]    Your procedure is scheduled on Wednesday, August 9.  Report to [facility] Admitting at 9:30 A.M.  Call this number if you have problems the morning of surgery:  [phone]   Remember:  Do not eat food or drink liquids after midnight.   Take these medicines the morning of surgery with A SIP OF WATER: alprazolam (Xanax), cyclobenzaprine (flexeril) if needed, gabapentin (neurontin), metoprolol (lopressor)  7 days prior to surgery STOP taking any Aspirin, Aleve, Naproxen, Ibuprofen, Motrin, Advil, Goody's, BC's, all herbal medications, fish oil, and all vitamins  WHAT DO I DO ABOUT MY DIABETES MEDICATION?   Marland Kitchen Do not take oral diabetes medicines (pills) the morning of surgery. Do not take metformin (glucophage) the day of surgery.     How to Manage Your Diabetes Before and After Surgery  Why is it important to control my blood sugar before and after surgery? . Improving blood sugar levels before and after surgery helps healing and can limit problems. . A way of improving blood sugar control is eating a healthy diet by: o  Eating less sugar and carbohydrates o  Increasing activity/exercise o  Talking with your doctor about reaching your blood sugar goals . High blood sugars (greater than 180 mg/dL) can raise your risk of infections and slow your recovery, so you will need to focus on controlling your diabetes during the weeks before surgery. . Make sure that the doctor who takes care of your diabetes knows about your planned surgery including the date and location.  How do I manage my blood sugar  before surgery? . Check your blood sugar at least 4 times a day, starting 2 days before surgery, to make sure that the level is not too high or low. o Check your blood sugar the morning of your surgery when you wake up and every 2 hours until you get to the Short Stay unit. . If your blood sugar is less than 70 mg/dL, you will need to treat for low blood sugar: o Do not take insulin. o Treat a low blood sugar (less than 70 mg/dL) with  cup of clear juice (cranberry or apple), 4 glucose tablets, OR glucose gel. o Recheck blood sugar in 15 minutes after treatment (to make sure it is greater than 70 mg/dL). If your blood sugar is not greater than 70 mg/dL on recheck, call [phone] for further instructions. . Report your blood sugar to the short stay nurse when you get to Short Stay.  . If you are admitted to the hospital after surgery: o Your blood sugar will be checked by the staff and you will probably be given insulin after surgery (instead of oral diabetes medicines) to make sure you have good blood sugar levels. o The goal for blood sugar control after surgery is 80-180 mg/dL.    Do not wear jewelry, make-up or nail polish.  Do not wear lotions, powders, or perfumes.  You may wear deoderant.  Do not shave 48 hours prior to surgery.  Men may shave face and neck.  Do not bring valuables to the hospital.  [facility] is not responsible for any belongings or valuables.  Contacts, dentures or bridgework may not be worn into surgery.  Leave your suitcase in the car.  After surgery it may be brought to your room.  For patients admitted to the hospital, discharge time will be determined by your treatment team.  Patients discharged the day of surgery will not be allowed to drive home.   Special instructions:    [facility]- Preparing For Surgery  Before surgery, you can play an important role. Because skin is not sterile, your skin needs to be as free of germs as possible. You can reduce  the number of germs on your skin by washing with CHG (chlorahexidine gluconate) Soap before surgery.  CHG is an antiseptic cleaner which kills germs and bonds with the skin to continue killing germs even after washing.  Please do not use if you have an allergy to CHG or antibacterial soaps. If your skin becomes reddened/irritated stop using the CHG.  Do not shave (including legs and underarms) for at least 48 hours prior to first CHG shower. It is OK to shave your face.  Please follow these instructions carefully.   1. Shower the NIGHT BEFORE SURGERY and the MORNING OF SURGERY with CHG.   2. If you chose to wash your hair, wash your hair first as usual with your normal shampoo.  3. After you shampoo, rinse your hair and body thoroughly to remove the shampoo.  4. Use CHG as you would any other liquid soap. You can apply CHG directly to the skin and wash gently with a scrungie or a clean washcloth.   5. Apply the CHG Soap to your body ONLY FROM THE NECK DOWN.  Do not use on open wounds or open sores. Avoid contact with your eyes, ears, mouth and genitals (private parts). Wash genitals (private parts) with your normal soap.  6. Wash thoroughly, paying special attention to the area where your surgery will be performed.  7. Thoroughly rinse your body with warm water from the neck down.  8. DO NOT shower/wash with your normal soap after using and rinsing off the CHG Soap.  9. Pat yourself dry with a CLEAN TOWEL.   10. Wear CLEAN PAJAMAS   11. Place CLEAN SHEETS on your bed the night of your first shower and DO NOT SLEEP WITH PETS.   Day of Surgery: Do not apply any deodorants/lotions. Please wear clean clothes to the hospital/surgery center.     Please read over the following fact sheets that you were given.

## 2015-09-18 ENCOUNTER — Encounter (HOSPITAL_COMMUNITY)
Admission: RE | Admit: 2015-09-18 | Discharge: 2015-09-18 | Disposition: A | Discharge status: Home / Self Care | Payer: 59 | Source: Ambulatory Visit | Attending: Otolaryngology | Admitting: Otolaryngology

## 2015-09-18 ENCOUNTER — Encounter (HOSPITAL_COMMUNITY): Payer: Self-pay

## 2015-09-18 DIAGNOSIS — Z01818 Encounter for other preprocedural examination: Secondary | ICD-10-CM | POA: Insufficient documentation

## 2015-09-18 DIAGNOSIS — J383 Other diseases of vocal cords: Secondary | ICD-10-CM | POA: Diagnosis not present

## 2015-09-18 DIAGNOSIS — E119 Type 2 diabetes mellitus without complications: Secondary | ICD-10-CM | POA: Diagnosis not present

## 2015-09-18 DIAGNOSIS — R001 Bradycardia, unspecified: Secondary | ICD-10-CM | POA: Insufficient documentation

## 2015-09-18 DIAGNOSIS — I10 Essential (primary) hypertension: Secondary | ICD-10-CM | POA: Insufficient documentation

## 2015-09-18 DIAGNOSIS — R490 Dysphonia: Secondary | ICD-10-CM | POA: Diagnosis not present

## 2015-09-18 DIAGNOSIS — Z01812 Encounter for preprocedural laboratory examination: Secondary | ICD-10-CM | POA: Insufficient documentation

## 2015-09-18 HISTORY — DX: Personal history of pneumonia (recurrent): Z87.01

## 2015-09-18 LAB — CBC
HCT: 44.5 % (ref 39.0–52.0)
Hemoglobin: 14.9 g/dL (ref 13.0–17.0)
MCH: 29.9 pg (ref 26.0–34.0)
MCHC: 33.5 g/dL (ref 30.0–36.0)
MCV: 89.4 fL (ref 78.0–100.0)
Platelets: 270 K/uL (ref 150–400)
RBC: 4.98 MIL/uL (ref 4.22–5.81)
RDW: 13 % (ref 11.5–15.5)
WBC: 7.2 K/uL (ref 4.0–10.5)

## 2015-09-18 LAB — COMPREHENSIVE METABOLIC PANEL WITH GFR
ALT: 26 U/L (ref 17–63)
AST: 31 U/L (ref 15–41)
Albumin: 4 g/dL (ref 3.5–5.0)
Alkaline Phosphatase: 69 U/L (ref 38–126)
Anion gap: 8 (ref 5–15)
BUN: 16 mg/dL (ref 6–20)
CO2: 23 mmol/L (ref 22–32)
Calcium: 9.3 mg/dL (ref 8.9–10.3)
Chloride: 107 mmol/L (ref 101–111)
Creatinine, Ser: 1.06 mg/dL (ref 0.61–1.24)
GFR calc Af Amer: >60 mL/min
GFR calc non Af Amer: >60 mL/min
Glucose, Bld: 141 mg/dL — ABNORMAL HIGH (ref 65–99)
Potassium: 4.2 mmol/L (ref 3.5–5.1)
Sodium: 138 mmol/L (ref 135–145)
Total Bilirubin: 0.4 mg/dL (ref 0.3–1.2)
Total Protein: 6.5 g/dL (ref 6.5–8.1)

## 2015-09-18 LAB — GLUCOSE, CAPILLARY: Glucose-Capillary: 136 mg/dL — ABNORMAL HIGH (ref 65–99)

## 2015-09-18 NOTE — Progress Notes (Addendum)
PCP - Dr. Arlyss Queen Cardiologist - Dr. Debara Pickett in 2015 but no longer sees a cardiologist   EKG - 09/18/15 CXR - denies  Echo- denies Stress test - 2015 Cardiac Cath - 2015  Patient denies chest pain and shortness of breath at PAT appointment.   Patient states that he does not check his blood sugar at home and is not aware of his fasting glucose.

## 2015-09-19 LAB — HEMOGLOBIN A1C
Hgb A1c MFr Bld: 7.1 % — ABNORMAL HIGH (ref 4.8–5.6)
Mean Plasma Glucose: 157 mg/dL

## 2015-09-21 ENCOUNTER — Telehealth: Payer: Self-pay

## 2015-09-21 ENCOUNTER — Other Ambulatory Visit: Payer: Self-pay | Admitting: Emergency Medicine

## 2015-09-21 ENCOUNTER — Telehealth: Payer: Self-pay | Admitting: *Deleted

## 2015-09-21 MED ORDER — BLOOD GLUCOSE TEST VI STRP: ORAL_STRIP | 2 refills | Status: DC

## 2015-09-21 MED ORDER — LANCETS MISC: 2 refills | Status: DC

## 2015-09-21 MED ORDER — BLOOD GLUCOSE MONITOR KIT
PACK | 0 refills | Status: DC
Start: 2015-09-21 — End: 2016-08-09

## 2015-09-21 NOTE — Telephone Encounter (Signed)
Pt daughter notified that rx's were sent in.

## 2015-09-21 NOTE — Telephone Encounter (Signed)
Needs prescription for glucose monitor and test strips.  He is scheduled for surgery on Wednesday.  He needs to monitor his sugar for two days prior.  Which is today and tomorrow.   [facility] on [address].   [phone]  Lucita Ferrara  (daughter)

## 2015-09-21 NOTE — Telephone Encounter (Signed)
Can we send this in ?

## 2015-09-21 NOTE — Telephone Encounter (Signed)
Lona (patient's daughter) wanted to add to the previous message. She states that the patient needs a prescription for a meter, test strips, and lancets. Lona would like a phone call if it can't be done today. Please advise!  [phone]

## 2015-09-21 NOTE — Telephone Encounter (Signed)
Please send in all the diabetic supplies that he needs.

## 2015-09-22 NOTE — Telephone Encounter (Signed)
Pt is testing his sugar levels for surgery but would like to know what his ranges should be for after surgery   Please call [phone]

## 2015-09-23 ENCOUNTER — Encounter (HOSPITAL_COMMUNITY): Payer: Self-pay | Admitting: *Deleted

## 2015-09-23 ENCOUNTER — Ambulatory Visit (HOSPITAL_COMMUNITY)
Admission: RE | Admit: 2015-09-23 | Discharge: 2015-09-23 | Disposition: A | Discharge status: Home / Self Care | Payer: 59 | Source: Ambulatory Visit | Attending: Otolaryngology | Admitting: Otolaryngology

## 2015-09-23 ENCOUNTER — Ambulatory Visit (HOSPITAL_COMMUNITY): Payer: 59 | Admitting: Anesthesiology

## 2015-09-23 ENCOUNTER — Encounter (HOSPITAL_COMMUNITY)
Admission: RE | Disposition: A | Discharge status: Home / Self Care | Payer: Self-pay | Source: Ambulatory Visit | Attending: Otolaryngology

## 2015-09-23 DIAGNOSIS — I2510 Atherosclerotic heart disease of native coronary artery without angina pectoris: Secondary | ICD-10-CM | POA: Diagnosis not present

## 2015-09-23 DIAGNOSIS — R490 Dysphonia: Secondary | ICD-10-CM | POA: Insufficient documentation

## 2015-09-23 DIAGNOSIS — Z7982 Long term (current) use of aspirin: Secondary | ICD-10-CM | POA: Diagnosis not present

## 2015-09-23 DIAGNOSIS — E785 Hyperlipidemia, unspecified: Secondary | ICD-10-CM | POA: Insufficient documentation

## 2015-09-23 DIAGNOSIS — I10 Essential (primary) hypertension: Secondary | ICD-10-CM | POA: Diagnosis not present

## 2015-09-23 DIAGNOSIS — E119 Type 2 diabetes mellitus without complications: Secondary | ICD-10-CM | POA: Diagnosis not present

## 2015-09-23 DIAGNOSIS — Z87891 Personal history of nicotine dependence: Secondary | ICD-10-CM | POA: Diagnosis not present

## 2015-09-23 HISTORY — PX: MICROLARYNGOSCOPY: SHX5208

## 2015-09-23 LAB — GLUCOSE, CAPILLARY
Glucose-Capillary: 144 mg/dL — ABNORMAL HIGH (ref 65–99)
Glucose-Capillary: 106 mg/dL — ABNORMAL HIGH (ref 65–99)

## 2015-09-23 SURGERY — MICROLARYNGOSCOPY
Anesthesia: General | Site: Throat

## 2015-09-23 MED ORDER — PHENYLEPHRINE 40 MCG/ML (10ML) SYRINGE FOR IV PUSH (FOR BLOOD PRESSURE SUPPORT)
PREFILLED_SYRINGE | INTRAVENOUS | Status: AC
  Filled 2015-09-23: qty 10

## 2015-09-23 MED ORDER — ONDANSETRON HCL 4 MG/2ML IJ SOLN
INTRAMUSCULAR | Status: DC | PRN
Start: 2015-09-23 — End: 2015-09-23
  Administered 2015-09-23: 4 mg via INTRAVENOUS

## 2015-09-23 MED ORDER — FENTANYL CITRATE (PF) 100 MCG/2ML IJ SOLN
INTRAMUSCULAR | Status: AC
  Filled 2015-09-23: qty 2

## 2015-09-23 MED ORDER — TRIAMCINOLONE ACETONIDE 40 MG/ML IJ SUSP
INTRAMUSCULAR | Status: AC
  Filled 2015-09-23: qty 5

## 2015-09-23 MED ORDER — 0.9 % SODIUM CHLORIDE (POUR BTL) OPTIME
TOPICAL | Status: DC | PRN
  Administered 2015-09-23: 1000 mL

## 2015-09-23 MED ORDER — LIDOCAINE-EPINEPHRINE 1 %-1:100000 IJ SOLN
INTRAMUSCULAR | Status: DC | PRN
  Administered 2015-09-23: .1 mL

## 2015-09-23 MED ORDER — SUCCINYLCHOLINE CHLORIDE 200 MG/10ML IV SOSY
PREFILLED_SYRINGE | INTRAVENOUS | Status: DC | PRN
  Administered 2015-09-23: 140 mg via INTRAVENOUS

## 2015-09-23 MED ORDER — EPHEDRINE 5 MG/ML INJ
INTRAVENOUS | Status: AC
  Filled 2015-09-23: qty 10

## 2015-09-23 MED ORDER — FENTANYL CITRATE (PF) 100 MCG/2ML IJ SOLN
25.0000 ug | INTRAMUSCULAR | Status: DC | PRN
  Administered 2015-09-23: 50 ug via INTRAVENOUS

## 2015-09-23 MED ORDER — LIDOCAINE 2% (20 MG/ML) 5 ML SYRINGE
INTRAMUSCULAR | Status: DC | PRN
  Administered 2015-09-23: 100 mg via INTRAVENOUS

## 2015-09-23 MED ORDER — PHENYLEPHRINE HCL 10 MG/ML IJ SOLN
INTRAMUSCULAR | Status: DC | PRN
  Administered 2015-09-23 (×3): 80 ug via INTRAVENOUS

## 2015-09-23 MED ORDER — PROPOFOL 10 MG/ML IV BOLUS
INTRAVENOUS | Status: DC | PRN
  Administered 2015-09-23: 150 mg via INTRAVENOUS

## 2015-09-23 MED ORDER — LIDOCAINE-EPINEPHRINE 1 %-1:100000 IJ SOLN
INTRAMUSCULAR | Status: AC
  Filled 2015-09-23: qty 1

## 2015-09-23 MED ORDER — EPINEPHRINE HCL (NASAL) 0.1 % NA SOLN
NASAL | Status: AC
  Filled 2015-09-23: qty 30

## 2015-09-23 MED ORDER — LACTATED RINGERS IV SOLN
INTRAVENOUS | Status: DC
  Administered 2015-09-23: 10:00:00 via INTRAVENOUS

## 2015-09-23 MED ORDER — FENTANYL CITRATE (PF) 100 MCG/2ML IJ SOLN
INTRAMUSCULAR | Status: DC | PRN
  Administered 2015-09-23: 150 ug via INTRAVENOUS

## 2015-09-23 MED ORDER — MIDAZOLAM HCL 5 MG/5ML IJ SOLN
INTRAMUSCULAR | Status: DC | PRN
  Administered 2015-09-23: 2 mg via INTRAVENOUS

## 2015-09-23 MED ORDER — FENTANYL CITRATE (PF) 250 MCG/5ML IJ SOLN
INTRAMUSCULAR | Status: AC
  Filled 2015-09-23: qty 5

## 2015-09-23 SURGICAL SUPPLY — 38 items
BANDAGE EYE OVAL (MISCELLANEOUS) ×2 IMPLANT
BLADE SURG 15 STRL LF DISP TIS (BLADE) IMPLANT
BLADE SURG 15 STRL SS (BLADE)
CANISTER SUCTION 2500CC (MISCELLANEOUS) ×2 IMPLANT
CONT SPEC 4OZ CLIKSEAL STRL BL (MISCELLANEOUS) ×4 IMPLANT
CONT SPEC STER OR (MISCELLANEOUS) ×1 IMPLANT
COVER MAYO STAND STRL (DRAPES) ×2 IMPLANT
COVER TABLE BACK 60X90 (DRAPES) ×2 IMPLANT
DECANTER SPIKE VIAL GLASS SM (MISCELLANEOUS) ×1 IMPLANT
DRAPE PROXIMA HALF (DRAPES) ×2 IMPLANT
DRESSING TELFA 8X10 (GAUZE/BANDAGES/DRESSINGS) ×2 IMPLANT
GAUZE SPONGE 4X4 16PLY XRAY LF (GAUZE/BANDAGES/DRESSINGS) ×2 IMPLANT
GLOVE BIOGEL PI IND STRL 7.0 (GLOVE) IMPLANT
GLOVE BIOGEL PI INDICATOR 7.0 (GLOVE) ×1
GLOVE ECLIPSE 7.5 STRL STRAW (GLOVE) ×3 IMPLANT
GLOVE SURG SS PI 7.0 STRL IVOR (GLOVE) ×1 IMPLANT
GOWN STRL REUS W/ TWL LRG LVL3 (GOWN DISPOSABLE) IMPLANT
GOWN STRL REUS W/TWL LRG LVL3 (GOWN DISPOSABLE) ×2
GUARD TEETH (MISCELLANEOUS) ×1 IMPLANT
KIT BASIN OR (CUSTOM PROCEDURE TRAY) ×2 IMPLANT
KIT ROOM TURNOVER OR (KITS) ×2 IMPLANT
NDL SPNL 20GX3.5 QUINCKE YW (NEEDLE) IMPLANT
NDL SPNL 25GX3.5 QUINCKE BL (NEEDLE) IMPLANT
NEEDLE 27GAX1X1/2 (NEEDLE) ×1 IMPLANT
NEEDLE SPNL 20GX3.5 QUINCKE YW (NEEDLE) ×2 IMPLANT
NEEDLE SPNL 25GX3.5 QUINCKE BL (NEEDLE) ×2 IMPLANT
NS IRRIG 1000ML POUR BTL (IV SOLUTION) ×2 IMPLANT
PAD ARMBOARD 7.5X6 YLW CONV (MISCELLANEOUS) ×4 IMPLANT
PATTIES SURGICAL .5 X1 (DISPOSABLE) ×1 IMPLANT
SOLUTION ANTI FOG 6CC (MISCELLANEOUS) ×2 IMPLANT
SPECIMEN JAR SMALL (MISCELLANEOUS) ×2 IMPLANT
SYR 3ML LL SCALE MARK (SYRINGE) IMPLANT
SYR CONTROL 10ML LL (SYRINGE) ×1 IMPLANT
SYR TB 1ML LUER SLIP (SYRINGE) ×1 IMPLANT
TOWEL NATURAL 10PK STERILE (DISPOSABLE) ×1 IMPLANT
TOWEL OR 17X24 6PK STRL BLUE (TOWEL DISPOSABLE) ×3 IMPLANT
TUBE CONNECTING 12X1/4 (SUCTIONS) ×2 IMPLANT
WATER STERILE IRR 1000ML POUR (IV SOLUTION) ×2 IMPLANT

## 2015-09-23 NOTE — Progress Notes (Signed)
Patient c/o of chest pain. EKG performed. Dr. Glennon Mac to bedside to read EKG. Patient stated he burped and felt "a little better". Dr Glennon Mac cleared patient to be discharged home today.

## 2015-09-23 NOTE — Op Note (Signed)
OPERATIVE REPORT  DATE OF SURGERY: 09/23/2015  PATIENT:  Seth Pope,  65 y.o. male  PRE-OPERATIVE DIAGNOSIS:  HOARSE AND LESION OF VOCAL CORD  POST-OPERATIVE DIAGNOSIS:  HOARSE AND LESION OF VOCAL CORD  PROCEDURE:  Procedure(s): MICROLARYNGOSCOPY REMOVAL OF LESION  SURGEON:  Beckie Salts, MD  ASSISTANTS: none  ANESTHESIA:   General   EBL:  2 ml  DRAINS: none  LOCAL MEDICATIONS USED:  None  SPECIMEN:  Left vocal cord lesion  COUNTS:  Correct  PROCEDURE DETAILS: The patient was taken to the operating room and placed on the operating table in the supine position. Following induction of general endotracheal anesthesia, the table was turned 90 and the patient was draped in a standard fashion.   A maxillary tooth protector was used throughout the case. The Dedo laryngoscope was entered into the oral cavity positioned properly to examine the larynx and secured to the Mayo stand with the suspension apparatus. The microscope was brought into the field. 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 This was pedunculated, and right on the contacting surface. The left cord was infiltrated with local anesthetic solution. Biopsy forceps were used to remove the entire lesion. The base was treated with topical application of adrenaline for hemostasis. The patient was then awakened abated and transferred to recovery in stable condition.    PATIENT DISPOSITION:  To PACU, stable

## 2015-09-23 NOTE — Discharge Instructions (Signed)
Relative voice rest.  Avoid whispering, screaming, straining the voice, excessive talking.  Avoid any smoke exposure.

## 2015-09-23 NOTE — Telephone Encounter (Signed)
As long as it stays under 250 that should be fine

## 2015-09-23 NOTE — H&P (View-Only) (Signed)
Seth Pope is a 65 y.o. male who presents as a consult Patient.   Referring Provider: Deveron Furlong  Chief complaint: Hoarseness.  HPI: Intermittent hoarseness for about a year. He denies sore throat, trouble swallowing, trouble breathing. He denies heartburn. He suffered with diabetes but otherwise fairly healthy. He denies any cardiac history.  PMH/Meds/All/SocHx/FamHx/ROS:   Past Medical History:  Diagnosis Date  . CAD (coronary artery disease)  . Diabetes mellitus ([place])  . Hyperlipidemia  . Hypertension   Past Surgical History:  Procedure Laterality Date  . BACK SURGERY  . CARDIAC CATHETERIZATION  . HERNIA REPAIR  . KNEE SURGERY  . ROTATOR CUFF REPAIR   No family history of bleeding disorders, wound healing problems or difficulty with anesthesia.   Social History   Social History  . Marital status: Unknown  Spouse name: N/A  . Number of children: N/A  . Years of education: N/A   Occupational History  . Not on file.   Social History Main Topics  . Smoking status: Former Research scientist (life sciences)  . Smokeless tobacco: Never Used  Comment: 1989  . Alcohol use No  . Drug use: Not on file  . Sexual activity: Not on file   Other Topics Concern  . Not on file   Social History Narrative  . No narrative on file   Current Outpatient Prescriptions:  . ALPRAZolam (XANAX XR) 1 MG 24 hr tablet, Take by mouth., Disp: , Rfl:  . aspirin 81 MG EC tablet *ANTIPLATELET*, Take by mouth., Disp: , Rfl:  . atorvastatin (LIPITOR) 80 MG tablet, take 1 tablet by mouth once daily, Disp: , Rfl:  . cyclobenzaprine (FLEXERIL) 5 MG tablet, Take by mouth., Disp: , Rfl:  . desipramine (NORPRAMIN) 50 MG tablet, Take by mouth., Disp: , Rfl:  . fenofibrate (LOFIBRA) 54 MG tablet, take 1 tablet by mouth once daily, Disp: , Rfl:  . gabapentin (NEURONTIN) 300 MG capsule, Take 1 in the morning and one in the early afternoon for your neuropathy., Disp: , Rfl:  . hydroCHLOROthiazide (MICROZIDE) 12.5 mg  capsule, take 1 capsule by mouth once daily, Disp: , Rfl:  . ibuprofen (ADVIL,MOTRIN) 200 MG tablet, Take by mouth., Disp: , Rfl:  . lisinopril (PRINIVIL,ZESTRIL) 10 MG tablet, take 1 tablet by mouth once daily, Disp: , Rfl:  . metFORMIN (GLUCOPHAGE-XR) 500 MG 24 hr tablet, take 1 tablet by mouth twice a day, Disp: , Rfl:  . metoPROLOL tartrate (LOPRESSOR) 100 MG tablet, take 1 tablet by mouth twice a day, Disp: , Rfl:  . nitroglycerin (NITROSTAT) 0.4 MG SL tablet, Place one under the tongue as needed for chest pain. Repeat in 5 minutes if no effect. Call 911 if you need to take the medication, Disp: , Rfl:  . zoster vaccine live, PF, (ZOSTAVAX) 19,400 unit/0.65 mL injection, Inject 19,400 Units into the skin., Disp: , Rfl:   A complete ROS was performed with pertinent positives/negatives noted in the HPI. The remainder of the ROS are negative.   Physical Exam:   BP 128/76  Ht 1.765 m (5' 9.5")  Wt 82.6 kg (182 lb)  BMI 26.49 kg/m2  General: Healthy and alert, in no distress, breathing easily. Normal affect. In a pleasant mood. Head: Normocephalic, atraumatic. No masses, or scars. Eyes: Pupils are equal, and reactive to light. Vision is grossly intact. No spontaneous or gaze nystagmus. Ears: Ear canals are clear. Tympanic membranes are intact, with normal landmarks and the middle ears are clear and healthy. Hearing: Grossly normal. Nose: Nasal  cavities are clear with healthy mucosa, no polyps or exudate.Airways are patent. Face: No masses or scars, facial nerve function is symmetric. Oral Cavity: No mucosal abnormalities are noted. Tongue with normal mobility. Dentition appears healthy. Oropharynx: Tonsils are symmetric. There are no mucosal masses identified. Tongue base appears normal and healthy.  Larynx/Hypopharynx: indirect exam reveals healthy, mobile vocal cords, without mucosal lesions in the hypopharynx or larynx. the only abnormal finding is a tiny papillomatous lesion involving  the midportion of the left vocal cord at the contacting surface. Chest: Deferred Neck: No palpable masses, no cervical adenopathy, no thyroid nodules or enlargement. Neuro: Cranial nerves II-XII will normal function. Balance: Normal gate. Other findings: none.  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 Recommend microlaryngoscopy with excision. Risks and benefits were discussed. We will schedule at his convenience.

## 2015-09-23 NOTE — Anesthesia Postprocedure Evaluation (Signed)
Anesthesia Post Note  Patient: Seth Pope  Procedure(s) Performed: Procedure(s) (LRB): MICROLARYNGOSCOPY REMOVAL OF LESION (N/A)  Patient location during evaluation: PACU Anesthesia Type: General Level of consciousness: sedated Pain management: satisfactory to patient Vital Signs Assessment: post-procedure vital signs reviewed and stable Respiratory status: spontaneous breathing Cardiovascular status: stable Anesthetic complications: no    Last Vitals:  Vitals:   09/23/15 1331 09/23/15 1347  BP: 115/68 (!) 123/101  Pulse: (!) 54 (!) 56  Resp: 14 16  Temp:      Last Pain:  Vitals:   09/23/15 1330  TempSrc:   PainSc: 5                  Milagros Middendorf EDWARD

## 2015-09-23 NOTE — Transfer of Care (Signed)
Immediate Anesthesia Transfer of Care Note  Patient: Seth Pope  Procedure(s) Performed: Procedure(s): MICROLARYNGOSCOPY REMOVAL OF LESION (N/A)  Patient Location: PACU  Anesthesia Type:General  Level of Consciousness: awake, alert  and oriented  Airway & Oxygen Therapy: Patient Spontanous Breathing and Patient connected to nasal cannula oxygen  Post-op Assessment: Report given to RN, Post -op Vital signs reviewed and stable and Patient moving all extremities X 4  Post vital signs: Reviewed and stable  Last Vitals:  Vitals:   09/23/15 0947 09/23/15 1302  BP: 119/77 127/72  Pulse: (!) 50 (!) 57  Resp: 18 12  Temp: 37.1 C 36.5 C    Last Pain:  Vitals:   09/23/15 1302  TempSrc:   PainSc: 0-No pain         Complications: No apparent anesthesia complications

## 2015-09-23 NOTE — Anesthesia Procedure Notes (Signed)
Procedure Name: Intubation Date/Time: 09/23/2015 12:16 PM Performed by: Rush Farmer E Pre-anesthesia Checklist: Patient identified, Emergency Drugs available, Suction available, Patient being monitored and Timeout performed Patient Re-evaluated:Patient Re-evaluated prior to inductionOxygen Delivery Method: Circle system utilized Preoxygenation: Pre-oxygenation with 100% oxygen Intubation Type: IV induction Ventilation: Mask ventilation without difficulty Laryngoscope Size: Mac and 3 Grade View: Grade I Tube size: 7.0 mm Number of attempts: 1 Airway Equipment and Method: Stylet and LTA kit utilized Placement Confirmation: ETT inserted through vocal cords under direct vision,  positive ETCO2 and breath sounds checked- equal and bilateral Secured at: 21 cm Tube secured with: Tape Dental Injury: Teeth and Oropharynx as per pre-operative assessment

## 2015-09-23 NOTE — Telephone Encounter (Signed)
Spoke with patient and gave him message. Patient states that his sugars have been in range.

## 2015-09-23 NOTE — Interval H&P Note (Signed)
History and Physical Interval Note:  09/23/2015 12:02 PM  Seth Pope  has presented today for surgery, with the diagnosis of HOARSE AND LESION OF VOCAL CORD  The various methods of treatment have been discussed with the patient and family. After consideration of risks, benefits and other options for treatment, the patient has consented to  Procedure(s): MICROLARYNGOSCOPY REMOVAL OF LESION (N/A) as a surgical intervention .  The patient's history has been reviewed, patient examined, no change in status, stable for surgery.  I have reviewed the patient's chart and labs.  Questions were answered to the patient's satisfaction.     [name]

## 2015-09-23 NOTE — Anesthesia Preprocedure Evaluation (Addendum)
Anesthesia Evaluation  Patient identified by MRN, date of birth, ID band Patient awake    Reviewed: Allergy & Precautions, H&P , Patient's Chart, lab work & pertinent test results, reviewed documented beta blocker date and time   Airway Mallampati: II  TM Distance: >3 FB Neck ROM: full    Dental no notable dental hx. (+) Teeth Intact   Pulmonary former smoker,    Pulmonary exam normal breath sounds clear to auscultation       Cardiovascular  Rhythm:regular Rate:Normal     Neuro/Psych    GI/Hepatic   Endo/Other  diabetes  Renal/GU      Musculoskeletal   Abdominal   Peds  Hematology   Anesthesia Other Findings Hypertension    CAD  No ischemia myocardial perfusion scan 2015.... No sx of CAD Diabetes mellitus  type 2       Reproductive/Obstetrics                            Anesthesia Physical Anesthesia Plan  ASA: II  Anesthesia Plan: General   Post-op Pain Management:    Induction: Intravenous  Airway Management Planned: Oral ETT  Additional Equipment:   Intra-op Plan:   Post-operative Plan: Extubation in OR  Informed Consent: I have reviewed the patients History and Physical, chart, labs and discussed the procedure including the risks, benefits and alternatives for the proposed anesthesia with the patient or authorized representative who has indicated his/her understanding and acceptance.   Dental Advisory Given and Dental advisory given  Plan Discussed with: CRNA and Surgeon  Anesthesia Plan Comments: (  Discussed general anesthesia, including possible nausea, instrumentation of airway, sore throat,pulmonary aspiration, etc. I asked if the were any outstanding questions, or  concerns before we proceeded. )        Anesthesia Quick Evaluation

## 2015-09-24 ENCOUNTER — Encounter (HOSPITAL_COMMUNITY): Payer: Self-pay | Admitting: Otolaryngology

## 2015-10-03 ENCOUNTER — Other Ambulatory Visit: Payer: Self-pay | Admitting: Emergency Medicine

## 2015-10-03 ENCOUNTER — Telehealth: Payer: Self-pay | Admitting: Emergency Medicine

## 2015-10-03 NOTE — Telephone Encounter (Signed)
Post surgery BS has been running high over 200/// daughter: Nolen Mu. Called req.  Call back...  [phone]

## 2015-10-05 ENCOUNTER — Telehealth: Payer: Self-pay | Admitting: Emergency Medicine

## 2015-10-05 NOTE — Telephone Encounter (Signed)
R. Daub do you want me to tell pt to come in?

## 2015-10-05 NOTE — Telephone Encounter (Signed)
Called and spoke with patient. His sugars are running high. He will make a copy of his recent sugars and bring that with him and get in to see one about providers for adjustment of medications.

## 2015-10-06 ENCOUNTER — Ambulatory Visit (INDEPENDENT_AMBULATORY_CARE_PROVIDER_SITE_OTHER): Payer: 59 | Admitting: Family Medicine

## 2015-10-06 ENCOUNTER — Other Ambulatory Visit: Payer: Self-pay | Admitting: Family Medicine

## 2015-10-06 VITALS — BP 130/80 | HR 60 | Temp 98.0°F | Resp 17 | Ht 70.5 in | Wt 190.0 lb

## 2015-10-06 DIAGNOSIS — E119 Type 2 diabetes mellitus without complications: Secondary | ICD-10-CM | POA: Diagnosis not present

## 2015-10-06 MED ORDER — METFORMIN HCL ER 500 MG PO TB24: ORAL_TABLET | ORAL | 3 refills | Status: DC

## 2015-10-06 NOTE — Progress Notes (Signed)
Patient ID: Seth Pope, male    DOB: [birth year]  Age: 65 y.o. MRN: [id number]  Chief Complaint  Patient presents with  . Medication Refill    metformin    Subjective:   Patient is here for a reassessment of his diabetes. He has been a regular patient of Dr. Caren Griffins and is going to be going to a new physician. He has not gotten into the new doctor yet but will see them sometime in the coming weeks. He is a friend of Dr. Everlene Farrier and has been in communication with him. His sugars have been variable between 100-150 most of the time, but was high over 200 the other day. On that day he had had a breakfast out in a restaurant a couple hours earlier. Later that afternoon he came down very promptly to below 100. He stays active. Takes his medicines regularly. Only recently has become more compulsive about checking his sugars. He feels well.  Current allergies, medications, problem list, past/family and social histories reviewed.  Objective:  BP 130/80 (BP Location: Right Arm, Patient Position: Sitting, Cuff Size: Normal)   Pulse 60   Temp 98 F (36.7 C) (Oral)   Resp 17   Ht 5' 10.5" (1.791 m)   Wt 190 lb (86.2 kg)   SpO2 98%   BMI 26.88 kg/m   No major acute distress. Reviewed his blood sugars but did not examine him in detail today since he was just seen recently.  Assessment & Plan:   Assessment: 1. Type 2 diabetes mellitus without complication, without long-term current use of insulin ([place])       Plan: Will adjust his metformin up to 1000 mg in the morning and 500 in the evening with a goal of getting his A1c down closer to 6.5. Recommended reading the American diabetic Association website between now and seeing his new doctor.  No orders of the defined types were placed in this encounter.   Meds ordered this encounter  Medications  . metFORMIN (GLUCOPHAGE-XR) 500 MG 24 hr tablet    Sig: Take 2 in the morning and 1 in the evening for diabetes    Dispense:  90 tablet    Refill:  3          Patient Instructions   Continue current medications except for increasing the metformin to 1000 mg (2500) in the morning and 500 mg at dinner.  Study the American Diabetes Association website online www.diabetes.org  Return as needed    IF you received an x-Hersh today, you will receive an invoice from [facility]. Please contact [facility] at [phone] with questions or concerns regarding your invoice.   IF you received labwork today, you will receive an invoice from [employer]. Please contact [facility] at [phone] with questions or concerns regarding your invoice.   Our billing staff will not be able to assist you with questions regarding bills from these companies.  You will be contacted with the lab results as soon as they are available. The fastest way to get your results is to activate your My Chart account. Instructions are located on the last page of this paperwork. If you have not heard from [place] regarding the results in 2 weeks, please contact this office.         Return if symptoms worsen or fail to improve.   HOPPER,DAVID, MD 10/06/2015

## 2015-10-06 NOTE — Patient Instructions (Addendum)
Continue current medications except for increasing the metformin to 1000 mg (2500) in the morning and 500 mg at dinner.  Study the American Diabetes Association website online www.diabetes.org  Return as needed    IF you received an x-Kielan today, you will receive an invoice from [facility]. Please contact [facility] at [phone] with questions or concerns regarding your invoice.   IF you received labwork today, you will receive an invoice from [facility] [employer]. Please contact [facility] at [phone] with questions or concerns regarding your invoice.   Our billing staff will not be able to assist you with questions regarding bills from these companies.  You will be contacted with the lab results as soon as they are available. The fastest way to get your results is to activate your My Chart account. Instructions are located on the last page of this paperwork. If you have not heard from [place] regarding the results in 2 weeks, please contact this office.

## 2015-10-07 ENCOUNTER — Ambulatory Visit (INDEPENDENT_AMBULATORY_CARE_PROVIDER_SITE_OTHER): Payer: 59 | Admitting: Neurology

## 2015-10-07 ENCOUNTER — Ambulatory Visit (INDEPENDENT_AMBULATORY_CARE_PROVIDER_SITE_OTHER): Payer: Self-pay | Admitting: Neurology

## 2015-10-07 ENCOUNTER — Encounter: Payer: Self-pay | Admitting: Neurology

## 2015-10-07 DIAGNOSIS — E1349 Other specified diabetes mellitus with other diabetic neurological complication: Secondary | ICD-10-CM

## 2015-10-07 NOTE — Progress Notes (Signed)
Please refer to EMG and nerve conduction study procedure note. 

## 2015-10-07 NOTE — Progress Notes (Signed)
Seth Pope returns for EMG and nerve conduction study today. No clear evidence of a peripheral neuropathy is seen, but a small fiber neuropathy cannot be excluded from this study. EMG of the right leg was unremarkable.  The patient indicates that the discomfort is not severe, he will try going up to 3 of the gabapentin tablets daily. He wishes to follow-up through this office if needed, he will contact us if the discomfort worsens.

## 2015-10-07 NOTE — Procedures (Signed)
     HISTORY:  Seth Pope is a 65 year old gentleman with diabetes who reports some dysesthesias in the balls of the feet bilaterally. The patient has a history of lumbosacral spine surgery previously, he denies any pain going from the back down the legs. He is being evaluated for a possible early peripheral neuropathy.  NERVE CONDUCTION STUDIES:  Nerve conduction studies were performed on both lower extremities. The distal motor latencies and motor amplitudes for the peroneal and posterior tibial nerves were within normal limits. The nerve conduction velocities for these nerves were also normal. The H reflex latencies were normal. The sensory latencies for the peroneal nerves, and for the medial and lateral plantar sensory nerves were within normal limits bilaterally.   EMG STUDIES:  EMG study was performed on the right lower extremity:  The tibialis anterior muscle reveals 2 to 4K motor units with full recruitment. No fibrillations or positive waves were seen. The peroneus tertius muscle reveals 2 to 4K motor units with full recruitment. No fibrillations or positive waves were seen. The medial gastrocnemius muscle reveals 1 to 3K motor units with full recruitment. No fibrillations or positive waves were seen. The vastus lateralis muscle reveals 2 to 4K motor units with full recruitment. No fibrillations or positive waves were seen. The iliopsoas muscle reveals 2 to 4K motor units with full recruitment. No fibrillations or positive waves were seen. The biceps femoris muscle (long head) reveals 2 to 4K motor units with full recruitment. No fibrillations or positive waves were seen. The lumbosacral paraspinal muscles were tested at 3 levels, and revealed no abnormalities of insertional activity at all 3 levels tested. There was good relaxation.   IMPRESSION:  Nerve conduction studies done on both lower extremities were within normal limits to include the medial and lateral plantar sensory  latencies. EMG evaluation of the right lower extremity was unremarkable. The possibility of a small fiber neuropathy should be considered, this may not be apparent on standard nerve conduction studies. There is no evidence of an overlying right lumbosacral radiculopathy.  Jill Alexanders MD 10/07/2015 4:36 PM  [facility] [address] [place], [place] [address]  Phone [phone] Fax [phone]

## 2015-11-08 ENCOUNTER — Other Ambulatory Visit: Payer: Self-pay | Admitting: Emergency Medicine

## 2015-11-15 ENCOUNTER — Other Ambulatory Visit (HOSPITAL_COMMUNITY): Payer: Self-pay | Admitting: Psychiatry

## 2015-11-25 ENCOUNTER — Other Ambulatory Visit: Payer: Self-pay | Admitting: Emergency Medicine

## 2015-12-02 ENCOUNTER — Other Ambulatory Visit: Payer: Self-pay | Admitting: Emergency Medicine

## 2015-12-02 ENCOUNTER — Other Ambulatory Visit (HOSPITAL_COMMUNITY): Payer: Self-pay | Admitting: Psychiatry

## 2015-12-12 ENCOUNTER — Other Ambulatory Visit: Payer: Self-pay | Admitting: Emergency Medicine

## 2015-12-14 ENCOUNTER — Other Ambulatory Visit: Payer: Self-pay | Admitting: Family Medicine

## 2015-12-16 ENCOUNTER — Other Ambulatory Visit: Payer: Self-pay | Admitting: Emergency Medicine

## 2016-01-11 ENCOUNTER — Other Ambulatory Visit: Payer: Self-pay | Admitting: Emergency Medicine

## 2016-01-11 ENCOUNTER — Other Ambulatory Visit: Payer: Self-pay | Admitting: Physician Assistant

## 2016-01-11 ENCOUNTER — Other Ambulatory Visit: Payer: Self-pay | Admitting: Neurology

## 2016-01-11 ENCOUNTER — Other Ambulatory Visit: Payer: Self-pay | Admitting: Family Medicine

## 2016-01-12 ENCOUNTER — Other Ambulatory Visit: Payer: Self-pay | Admitting: Physician Assistant

## 2016-01-13 NOTE — Telephone Encounter (Signed)
All RFs req'd by pharm were sent in for 90 days to give pt time to est w/new provider. Pt notified on VM.

## 2016-01-22 ENCOUNTER — Other Ambulatory Visit: Payer: Self-pay | Admitting: Family Medicine

## 2016-01-29 ENCOUNTER — Ambulatory Visit (INDEPENDENT_AMBULATORY_CARE_PROVIDER_SITE_OTHER): Payer: 59 | Admitting: Physician Assistant

## 2016-01-29 ENCOUNTER — Other Ambulatory Visit: Payer: Self-pay | Admitting: Physician Assistant

## 2016-01-29 VITALS — BP 100/70 | HR 66 | Temp 98.4°F | Resp 17 | Ht 70.5 in | Wt 189.0 lb

## 2016-01-29 DIAGNOSIS — E119 Type 2 diabetes mellitus without complications: Secondary | ICD-10-CM | POA: Diagnosis not present

## 2016-01-29 DIAGNOSIS — I10 Essential (primary) hypertension: Secondary | ICD-10-CM

## 2016-01-29 DIAGNOSIS — E7800 Pure hypercholesterolemia, unspecified: Secondary | ICD-10-CM | POA: Diagnosis not present

## 2016-01-29 DIAGNOSIS — F419 Anxiety disorder, unspecified: Secondary | ICD-10-CM | POA: Insufficient documentation

## 2016-01-29 DIAGNOSIS — E1142 Type 2 diabetes mellitus with diabetic polyneuropathy: Secondary | ICD-10-CM

## 2016-01-29 DIAGNOSIS — Z114 Encounter for screening for human immunodeficiency virus [HIV]: Secondary | ICD-10-CM

## 2016-01-29 MED ORDER — GABAPENTIN 300 MG PO CAPS: 300.0000 mg | ORAL_CAPSULE | Freq: Three times a day (TID) | ORAL | 1 refills | Status: DC

## 2016-01-29 MED ORDER — FENOFIBRATE 54 MG PO TABS: ORAL_TABLET | ORAL | 0 refills | Status: DC

## 2016-01-29 MED ORDER — METOPROLOL TARTRATE 100 MG PO TABS: 100.0000 mg | ORAL_TABLET | Freq: Two times a day (BID) | ORAL | 0 refills | Status: DC

## 2016-01-29 MED ORDER — METFORMIN HCL ER 500 MG PO TB24: ORAL_TABLET | ORAL | 0 refills | Status: DC

## 2016-01-29 MED ORDER — HYDROCHLOROTHIAZIDE 12.5 MG PO CAPS: ORAL_CAPSULE | ORAL | 0 refills | Status: DC

## 2016-01-29 MED ORDER — LISINOPRIL 10 MG PO TABS: 10.0000 mg | ORAL_TABLET | Freq: Every day | ORAL | 0 refills | Status: DC

## 2016-01-29 MED ORDER — ATORVASTATIN CALCIUM 80 MG PO TABS: 80.0000 mg | ORAL_TABLET | Freq: Every day | ORAL | 0 refills | Status: DC

## 2016-01-29 NOTE — Progress Notes (Signed)
Seth Pope  MRN: [id number] DOB: [birth year]  Subjective:  Pt presents to clinic for medication refill.  He is doing really well.    Glucose at home - low 100s BP at home - does not check that often  Review of Systems  Constitutional: Negative for chills and fever.  Respiratory: Negative for cough and shortness of breath.   Cardiovascular: Negative for chest pain, palpitations and leg swelling.  Neurological: Negative for numbness.    Patient Active Problem List   Diagnosis Date Noted  . Anxiety 01/29/2016  . Bradycardia 11/13/2013  . Family history of coronary artery disease in father 10/23/2013  . Chest pain with moderate risk of acute coronary syndrome 10/23/2013  . Shortness of breath 10/17/2013  . Tobacco abuse 10/17/2013  . Unstable angina ([place]) 10/04/2013  . PVC's (premature ventricular contractions) 10/04/2013  . Right groin pain 05/22/2012  . Neuropathy, diabetic ([place]) 01/31/2012  . Hypertension   . DM2 (diabetes mellitus, type 2) ([place])   . Hyperlipidemia     Current Outpatient Prescriptions on File Prior to Visit  Medication Sig Dispense Refill  . ALPRAZolam (XANAX XR) 1 MG 24 hr tablet Take 1 mg by mouth daily.    Marland Kitchen aspirin 81 MG tablet Take 81 mg by mouth every other day.     . blood glucose meter kit and supplies KIT Dispense based on patient and insurance preference.   DX: E11.9 1 each 0  . cyclobenzaprine (FLEXERIL) 5 MG tablet Take 1 tablet (5 mg total) by mouth 3 (three) times daily as needed for muscle spasms. 30 tablet 1  . desipramine (NORPRAMIN) 50 MG tablet Take 150 mg by mouth at bedtime.     Marland Kitchen ibuprofen (ADVIL,MOTRIN) 200 MG tablet Take 400 mg by mouth every 6 (six) hours as needed for headache.    . ketoconazole (NIZORAL) 2 % shampoo Apply 1 application topically 2 (two) times a week.  2  . nitroGLYCERIN (NITROSTAT) 0.4 MG SL tablet Place one under the tongue as needed for chest pain. Repeat in 5 minutes if no effect. Call 911 if you need to take  the medication 30 tablet 3  . ONETOUCH DELICA LANCETS FINE MISC USE AS DIRECTED TO CHECK BLOOD GLUCOSE UP TO THREE TIMES DAILY 100 each 0  . ONETOUCH VERIO test strip USE TO CHECK BLOOD SUGAR UP TO THREE TIMES DAILY. 100 each 0   No current facility-administered medications on file prior to visit.     Allergies  Allergen Reactions  . Penicillins Rash    Has patient had a PCN reaction causing immediate rash, facial/tongue/throat swelling, SOB or lightheadedness with hypotension: Yes Has patient had a PCN reaction causing severe rash involving mucus membranes or skin necrosis: No Has patient had a PCN reaction that required hospitalization No Has patient had a PCN reaction occurring within the last 10 years: No If all of the above answers are "NO", then may proceed with Cephalosporin use.     Pt patients past, family and social history were reviewed and updated.   Objective:  BP 100/70   Pulse 66   Temp 98.4 F (36.9 C) (Oral)   Resp 17   Ht 5' 10.5" (1.791 m)   Wt 189 lb (85.7 kg)   SpO2 98%   BMI 26.74 kg/m   Physical Exam  Constitutional: He is oriented to person, place, and time and well-developed, well-nourished, and in no distress.  HENT:  Head: Normocephalic and atraumatic.  Right Ear: External  ear normal.  Left Ear: External ear normal.  Eyes: Conjunctivae are normal.  Neck: Normal range of motion.  Cardiovascular: Normal rate, regular rhythm, normal heart sounds and intact distal pulses.   Pulmonary/Chest: Effort normal and breath sounds normal. He has no wheezes.  Musculoskeletal:       Right lower leg: He exhibits no edema.       Left lower leg: He exhibits no edema.  Neurological: He is alert and oriented to person, place, and time. Gait normal.  Skin: Skin is warm and dry.  Psychiatric: Mood, memory, affect and judgment normal.    Assessment and Plan :  Type 2 diabetes mellitus without complication, without long-term current use of insulin (HCC) - Plan:  metFORMIN (GLUCOPHAGE-XR) 500 MG 24 hr tablet, gabapentin (NEURONTIN) 300 MG capsule, Hemoglobin A1c  Essential hypertension - Plan: CMP14+EGFR, metoprolol (LOPRESSOR) 100 MG tablet, lisinopril (PRINIVIL,ZESTRIL) 10 MG tablet, hydrochlorothiazide (MICROZIDE) 12.5 MG capsule  Pure hypercholesterolemia - Plan: Lipid panel, fenofibrate 54 MG tablet, atorvastatin (LIPITOR) 80 MG tablet  Diabetic polyneuropathy associated with type 2 diabetes mellitus ([place])  Encounter for screening for HIV - Plan: HIV antibody   Continue all medications - will change based on labs but with his home numbers we would expect that the labs will be good.  Windell Hummingbird PA-C  [facility] 01/29/2016 9:51 AM

## 2016-01-29 NOTE — Patient Instructions (Signed)
     IF you received an x-[place] today, you will receive an invoice from [facility]. Please contact [facility] at [phone] with questions or concerns regarding your invoice.   IF you received labwork today, you will receive an invoice from [facility]. Please contact [facility] at [phone] with questions or concerns regarding your invoice.   Our billing staff will not be able to assist you with questions regarding bills from these companies.  You will be contacted with the lab results as soon as they are available. The fastest way to get your results is to activate your My Chart account. Instructions are located on the last page of this paperwork. If you have not heard from us regarding the results in 2 weeks, please contact this office.     

## 2016-01-30 LAB — CMP14+EGFR
ALT: 23 IU/L (ref 0–44)
AST: 24 IU/L (ref 0–40)
Albumin/Globulin Ratio: 1.9 (ref 1.2–2.2)
Albumin: 4.5 g/dL (ref 3.6–4.8)
Alkaline Phosphatase: 88 IU/L (ref 39–117)
BUN/Creatinine Ratio: 23 (ref 10–24)
BUN: 23 mg/dL (ref 8–27)
Bilirubin Total: 0.4 mg/dL (ref 0.0–1.2)
CO2: 22 mmol/L (ref 18–29)
Calcium: 9.6 mg/dL (ref 8.6–10.2)
Chloride: 104 mmol/L (ref 96–106)
Creatinine, Ser: 1.01 mg/dL (ref 0.76–1.27)
GFR calc Af Amer: 91 mL/min/{1.73_m2} (ref >59)
GFR calc non Af Amer: 79 mL/min/{1.73_m2} (ref >59)
Globulin, Total: 2.4 g/dL (ref 1.5–4.5)
Glucose: 153 mg/dL — ABNORMAL HIGH (ref 65–99)
Potassium: 5.1 mmol/L (ref 3.5–5.2)
Sodium: 145 mmol/L — ABNORMAL HIGH (ref 134–144)
Total Protein: 6.9 g/dL (ref 6.0–8.5)

## 2016-01-30 LAB — LIPID PANEL
Chol/HDL Ratio: 2.5 ratio units (ref 0.0–5.0)
Cholesterol, Total: 117 mg/dL (ref 100–199)
HDL: 47 mg/dL (ref >39)
LDL Calculated: 57 mg/dL (ref 0–99)
Triglycerides: 64 mg/dL (ref 0–149)
VLDL Cholesterol Cal: 13 mg/dL (ref 5–40)

## 2016-01-30 LAB — HIV ANTIBODY (ROUTINE TESTING W REFLEX): HIV Screen 4th Generation wRfx: NONREACTIVE

## 2016-02-06 ENCOUNTER — Other Ambulatory Visit: Payer: Self-pay | Admitting: Family Medicine

## 2016-05-06 ENCOUNTER — Telehealth: Payer: Self-pay | Admitting: Physician Assistant

## 2016-05-06 ENCOUNTER — Other Ambulatory Visit: Payer: Self-pay | Admitting: Physician Assistant

## 2016-05-06 DIAGNOSIS — I10 Essential (primary) hypertension: Secondary | ICD-10-CM

## 2016-05-06 MED ORDER — METOPROLOL TARTRATE 100 MG PO TABS: 100.0000 mg | ORAL_TABLET | Freq: Two times a day (BID) | ORAL | 0 refills | Status: DC

## 2016-05-06 NOTE — Telephone Encounter (Signed)
Pt has schd an apt with Windell Hummingbird for 3/27 for rx refills. Would like a refill on metoprolol in the meantime if possible, states that he has enough to get through Sunday but not Monday.

## 2016-05-10 ENCOUNTER — Ambulatory Visit (INDEPENDENT_AMBULATORY_CARE_PROVIDER_SITE_OTHER): Payer: 59 | Admitting: Physician Assistant

## 2016-05-10 ENCOUNTER — Encounter: Payer: Self-pay | Admitting: Physician Assistant

## 2016-05-10 DIAGNOSIS — E7800 Pure hypercholesterolemia, unspecified: Secondary | ICD-10-CM | POA: Diagnosis not present

## 2016-05-10 DIAGNOSIS — I10 Essential (primary) hypertension: Secondary | ICD-10-CM | POA: Diagnosis not present

## 2016-05-10 DIAGNOSIS — E119 Type 2 diabetes mellitus without complications: Secondary | ICD-10-CM

## 2016-05-10 MED ORDER — HYDROCHLOROTHIAZIDE 12.5 MG PO CAPS: ORAL_CAPSULE | ORAL | 0 refills | Status: DC

## 2016-05-10 MED ORDER — FENOFIBRATE 54 MG PO TABS: ORAL_TABLET | ORAL | 0 refills | Status: DC

## 2016-05-10 MED ORDER — LISINOPRIL 10 MG PO TABS: 10.0000 mg | ORAL_TABLET | Freq: Every day | ORAL | 0 refills | Status: DC

## 2016-05-10 MED ORDER — ATORVASTATIN CALCIUM 80 MG PO TABS: 80.0000 mg | ORAL_TABLET | Freq: Every day | ORAL | 0 refills | Status: DC

## 2016-05-10 MED ORDER — METFORMIN HCL ER 500 MG PO TB24: ORAL_TABLET | ORAL | 0 refills | Status: DC

## 2016-05-10 NOTE — Patient Instructions (Signed)
     IF you received an x-[place] today, you will receive an invoice from [facility]. Please contact [facility] at [phone] with questions or concerns regarding your invoice.   IF you received labwork today, you will receive an invoice from [facility]. Please contact [facility] at [phone] with questions or concerns regarding your invoice.   Our billing staff will not be able to assist you with questions regarding bills from these companies.  You will be contacted with the lab results as soon as they are available. The fastest way to get your results is to activate your My Chart account. Instructions are located on the last page of this paperwork. If you have not heard from us regarding the results in 2 weeks, please contact this office.     

## 2016-05-10 NOTE — Progress Notes (Signed)
Seth Pope  MRN: [id number] DOB: 01-01-1951  PCP: Elizabeth Sauer  Chief Complaint  Patient presents with  . Follow-up    3 mth f/u  . Medication Refill    ALL    Subjective:  Pt presents to clinic for medication refill.  He is doing well with all his medications.    Home glucose - have not been checking them - only checks when he has felt bad and he has felt really good Home BP monitoring - not doing.  Review of Systems  Constitutional: Negative for chills and fever.  Cardiovascular: Negative for chest pain and leg swelling.  Neurological: Negative for numbness.    Patient Active Problem List   Diagnosis Date Noted  . Anxiety 01/29/2016  . Bradycardia 11/13/2013  . Family history of coronary artery disease in father 10/23/2013  . Chest pain with moderate risk of acute coronary syndrome 10/23/2013  . Shortness of breath 10/17/2013  . Tobacco abuse 10/17/2013  . Unstable angina ([place]) 10/04/2013  . PVC's (premature ventricular contractions) 10/04/2013  . Right groin pain 05/22/2012  . Neuropathy, diabetic ([place]) 01/31/2012  . Hypertension   . DM2 (diabetes mellitus, type 2) ([place])   . Hyperlipidemia     Current Outpatient Prescriptions on File Prior to Visit  Medication Sig Dispense Refill  . ALPRAZolam (XANAX XR) 1 MG 24 hr tablet Take 1 mg by mouth daily.    Marland Kitchen aspirin 81 MG tablet Take 81 mg by mouth every other day.     . blood glucose meter kit and supplies KIT Dispense based on patient and insurance preference.   DX: E11.9 1 each 0  . cyclobenzaprine (FLEXERIL) 5 MG tablet Take 1 tablet (5 mg total) by mouth 3 (three) times daily as needed for muscle spasms. 30 tablet 1  . desipramine (NORPRAMIN) 50 MG tablet Take 150 mg by mouth at bedtime.     . gabapentin (NEURONTIN) 300 MG capsule Take 1 capsule (300 mg total) by mouth 3 (three) times daily. 270 capsule 1  . ibuprofen (ADVIL,MOTRIN) 200 MG tablet Take 400 mg by mouth every 6 (six) hours as needed for  headache.    . ketoconazole (NIZORAL) 2 % shampoo Apply 1 application topically 2 (two) times a week.  2  . metoprolol (LOPRESSOR) 100 MG tablet TAKE 1 TABLET(100 MG) BY MOUTH TWICE DAILY 180 tablet 0  . nitroGLYCERIN (NITROSTAT) 0.4 MG SL tablet Place one under the tongue as needed for chest pain. Repeat in 5 minutes if no effect. Call 911 if you need to take the medication 30 tablet 3  . ONETOUCH DELICA LANCETS FINE MISC USE AS DIRECTED TO CHECK BLOOD GLUCOSE UP TO THREE TIMES DAILY 100 each 0  . ONETOUCH VERIO test strip USE TO CHECK BLOOD SUGAR UP TO THREE TIMES DAILY. 100 each 0   No current facility-administered medications on file prior to visit.     Allergies  Allergen Reactions  . Penicillins Rash    Has patient had a PCN reaction causing immediate rash, facial/tongue/throat swelling, SOB or lightheadedness with hypotension: Yes Has patient had a PCN reaction causing severe rash involving mucus membranes or skin necrosis: No Has patient had a PCN reaction that required hospitalization No Has patient had a PCN reaction occurring within the last 10 years: No If all of the above answers are "NO", then may proceed with Cephalosporin use.     Pt patients past, family and social history were reviewed and updated.  Objective:  BP 120/76 (BP Location: Right Arm, Patient Position: Sitting, Cuff Size: Small)   Pulse 63   Temp 97.7 F (36.5 C) (Oral)   Ht 5' 10.5" (1.791 m)   Wt 189 lb 3.2 oz (85.8 kg)   SpO2 97%   BMI 26.76 kg/m   Physical Exam  Constitutional: He is oriented to person, place, and time and well-developed, well-nourished, and in no distress.  HENT:  Head: Normocephalic and atraumatic.  Right Ear: External ear normal.  Left Ear: External ear normal.  Eyes: Conjunctivae are normal.  Neck: Normal range of motion.  Cardiovascular: Normal rate, regular rhythm, normal heart sounds and intact distal pulses.   No murmur heard. Pulmonary/Chest: Effort normal and  breath sounds normal. He has no wheezes.  Musculoskeletal:       Right lower leg: He exhibits no edema.       Left lower leg: He exhibits no edema.  Neurological: He is alert and oriented to person, place, and time. Gait normal.  Skin: Skin is warm and dry.  Psychiatric: Mood, memory, affect and judgment normal.    Assessment and Plan :  Type 2 diabetes mellitus without complication, without long-term current use of insulin (HCC) - Plan: CMP14+EGFR, Hemoglobin A1c, metFORMIN (GLUCOPHAGE-XR) 500 MG 24 hr tablet - continue current medications will adjust dose if necessary based on labs  Essential hypertension - Plan: lisinopril (PRINIVIL,ZESTRIL) 10 MG tablet, hydrochlorothiazide (MICROZIDE) 12.5 MG capsule - well controlled - continue current medications  Pure hypercholesterolemia - Plan: fenofibrate 54 MG tablet, atorvastatin (LIPITOR) 80 MG tablet  - well controlled at last lab check in December.    Windell Hummingbird PA-C  [facility] 05/10/2016 10:17 AM

## 2016-05-11 LAB — CMP14+EGFR
ALT: 23 IU/L (ref 0–44)
AST: 20 IU/L (ref 0–40)
Albumin/Globulin Ratio: 1.8 (ref 1.2–2.2)
Albumin: 4.2 g/dL (ref 3.6–4.8)
Alkaline Phosphatase: 81 IU/L (ref 39–117)
BUN/Creatinine Ratio: 15 (ref 10–24)
BUN: 14 mg/dL (ref 8–27)
Bilirubin Total: 0.3 mg/dL (ref 0.0–1.2)
CO2: 22 mmol/L (ref 18–29)
Calcium: 9.4 mg/dL (ref 8.6–10.2)
Chloride: 100 mmol/L (ref 96–106)
Creatinine, Ser: 0.93 mg/dL (ref 0.76–1.27)
GFR calc Af Amer: 101 mL/min/{1.73_m2} (ref >59)
GFR calc non Af Amer: 87 mL/min/{1.73_m2} (ref >59)
Globulin, Total: 2.4 g/dL (ref 1.5–4.5)
Glucose: 136 mg/dL — ABNORMAL HIGH (ref 65–99)
Potassium: 4.3 mmol/L (ref 3.5–5.2)
Sodium: 139 mmol/L (ref 134–144)
Total Protein: 6.6 g/dL (ref 6.0–8.5)

## 2016-05-11 LAB — HEMOGLOBIN A1C
Est. average glucose Bld gHb Est-mCnc: 154 mg/dL
Hgb A1c MFr Bld: 7 % — ABNORMAL HIGH (ref 4.8–5.6)

## 2016-07-27 NOTE — Telephone Encounter (Signed)
error 

## 2016-08-05 ENCOUNTER — Telehealth: Payer: Self-pay | Admitting: Family Medicine

## 2016-08-05 ENCOUNTER — Other Ambulatory Visit: Payer: Self-pay | Admitting: Physician Assistant

## 2016-08-05 DIAGNOSIS — I10 Essential (primary) hypertension: Secondary | ICD-10-CM

## 2016-08-05 NOTE — Telephone Encounter (Signed)
Could you please fill accordingly. Thanks

## 2016-08-05 NOTE — Telephone Encounter (Signed)
PT NEED A REFILL ON ALL MEDICINES BEFORE HIS APPOINTMENT ON Tuesday THE 26

## 2016-08-08 NOTE — Telephone Encounter (Signed)
Please let the patient know in the future to contact the pharmacy for medication refills as I do not know which one he is referring to.

## 2016-08-09 ENCOUNTER — Encounter: Payer: Self-pay | Admitting: Physician Assistant

## 2016-08-09 ENCOUNTER — Ambulatory Visit (INDEPENDENT_AMBULATORY_CARE_PROVIDER_SITE_OTHER): Payer: 59 | Admitting: Physician Assistant

## 2016-08-09 VITALS — BP 109/72 | HR 62 | Temp 97.4°F | Resp 18 | Ht 70.5 in | Wt 183.6 lb

## 2016-08-09 DIAGNOSIS — E119 Type 2 diabetes mellitus without complications: Secondary | ICD-10-CM | POA: Diagnosis not present

## 2016-08-09 DIAGNOSIS — E7800 Pure hypercholesterolemia, unspecified: Secondary | ICD-10-CM | POA: Diagnosis not present

## 2016-08-09 DIAGNOSIS — M47816 Spondylosis without myelopathy or radiculopathy, lumbar region: Secondary | ICD-10-CM | POA: Diagnosis not present

## 2016-08-09 DIAGNOSIS — I10 Essential (primary) hypertension: Secondary | ICD-10-CM

## 2016-08-09 MED ORDER — HYDROCHLOROTHIAZIDE 12.5 MG PO CAPS: ORAL_CAPSULE | ORAL | 0 refills | Status: DC

## 2016-08-09 MED ORDER — METFORMIN HCL ER 500 MG PO TB24: ORAL_TABLET | ORAL | 0 refills | Status: DC

## 2016-08-09 MED ORDER — METOPROLOL TARTRATE 100 MG PO TABS: ORAL_TABLET | ORAL | 0 refills | Status: DC

## 2016-08-09 MED ORDER — ATORVASTATIN CALCIUM 80 MG PO TABS: 80.0000 mg | ORAL_TABLET | Freq: Every day | ORAL | 0 refills | Status: DC

## 2016-08-09 MED ORDER — FENOFIBRATE 54 MG PO TABS: ORAL_TABLET | ORAL | 0 refills | Status: DC

## 2016-08-09 MED ORDER — CYCLOBENZAPRINE HCL 5 MG PO TABS: 5.0000 mg | ORAL_TABLET | Freq: Three times a day (TID) | ORAL | 0 refills | Status: DC | PRN

## 2016-08-09 MED ORDER — LISINOPRIL 10 MG PO TABS: 10.0000 mg | ORAL_TABLET | Freq: Every day | ORAL | 0 refills | Status: DC

## 2016-08-09 MED ORDER — GABAPENTIN 300 MG PO CAPS: 300.0000 mg | ORAL_CAPSULE | Freq: Three times a day (TID) | ORAL | 1 refills | Status: DC

## 2016-08-09 NOTE — Addendum Note (Signed)
Addended by: Mancel Bale on: 08/09/2016 10:18 AM   Modules accepted: Orders

## 2016-08-09 NOTE — Telephone Encounter (Signed)
Pt has appt today

## 2016-08-09 NOTE — Progress Notes (Signed)
Seth Pope  MRN: [id number] DOB: March 15, 1950  PCP: Seth Bale, PA-C  Chief Complaint  Patient presents with  . Diabetes    follow up  . Medication Refill    lopressor     Subjective:  Pt presents to clinic for medication recheck.  He feels good.  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  He has no concerns today.  He only checks his BP and blood glucose when he feels bad.  Review of Systems  Constitutional: Negative for chills and fever.  Respiratory: Negative for cough and shortness of breath.   Cardiovascular: Negative for chest pain, palpitations and leg swelling.  Neurological: Negative for numbness.    Patient Active Problem List   Diagnosis Date Noted  . Anxiety 01/29/2016  . Bradycardia 11/13/2013  . Family history of coronary artery disease in father 10/23/2013  . Chest pain with moderate risk of acute coronary syndrome 10/23/2013  . Shortness of breath 10/17/2013  . Tobacco abuse 10/17/2013  . Unstable angina ([place]) 10/04/2013  . PVC's (premature ventricular contractions) 10/04/2013  . Right groin pain 05/22/2012  . Neuropathy, diabetic ([place]) 01/31/2012  . Hypertension   . DM2 (diabetes mellitus, type 2) ([place])   . Hyperlipidemia     Current Outpatient Prescriptions on File Prior to Visit  Medication Sig Dispense Refill  . ALPRAZolam (XANAX XR) 1 MG 24 hr tablet Take 1 mg by mouth daily.    Marland Kitchen ALPRAZolam (XANAX) 0.25 MG tablet TK ONE T PO PRN D  0  . aspirin 81 MG tablet Take 81 mg by mouth every other day.     . desipramine (NORPRAMIN) 50 MG tablet Take 150 mg by mouth at bedtime.     Marland Kitchen ibuprofen (ADVIL,MOTRIN) 200 MG tablet Take 400 mg by mouth every 6 (six) hours as needed for headache.    . ketoconazole (NIZORAL) 2 % shampoo Apply 1 application topically 2 (two) times a week.  2  . nitroGLYCERIN (NITROSTAT) 0.4 MG SL tablet Place one under the tongue as needed for chest pain. Repeat in 5 minutes if no effect. Call 911 if you need to take  the medication 30 tablet 3  . ONETOUCH DELICA LANCETS FINE MISC USE AS DIRECTED TO CHECK BLOOD GLUCOSE UP TO THREE TIMES DAILY 100 each 0  . ONETOUCH VERIO test strip USE TO CHECK BLOOD SUGAR UP TO THREE TIMES DAILY. 100 each 0   No current facility-administered medications on file prior to visit.     Allergies  Allergen Reactions  . Penicillins Rash    Has patient had a PCN reaction causing immediate rash, facial/tongue/throat swelling, SOB or lightheadedness with hypotension: Yes Has patient had a PCN reaction causing severe rash involving mucus membranes or skin necrosis: No Has patient had a PCN reaction that required hospitalization No Has patient had a PCN reaction occurring within the last 10 years: No If all of the above answers are "NO", then may proceed with Cephalosporin use.     Pt patients past, family and social history were reviewed and updated.   Objective:  BP 109/72   Pulse 62   Temp 97.4 F (36.3 C) (Oral)   Resp 18   Ht 5' 10.5" (1.791 m)   Wt 183 lb 9.6 oz (83.3 kg)   SpO2 97%   BMI 25.97 kg/m   Physical Exam  Constitutional: He is oriented to person, place, and time and well-developed, well-nourished, and in no distress.  HENT:  Head: Normocephalic and atraumatic.  Right Ear: External ear normal.  Left Ear: External ear normal.  Eyes: Conjunctivae are normal.  Neck: Normal range of motion.  Cardiovascular: Normal rate, regular rhythm, normal heart sounds and intact distal pulses.   Pulmonary/Chest: Effort normal and breath sounds normal. He has no wheezes.  Musculoskeletal:       Right lower leg: He exhibits no edema.       Left lower leg: He exhibits no edema.  Neurological: He is alert and oriented to person, place, and time. Gait normal.  Skin: Skin is warm and dry.  Scabbed area on his nose - he scratched it and he is watching it closely  Psychiatric: Mood, memory, affect and judgment normal.    Assessment and Plan :  Osteoarthritis of  lumbar spine, unspecified spinal osteoarthritis complication status - Plan: cyclobenzaprine (FLEXERIL) 5 MG tablet  Type 2 diabetes mellitus without complication, without long-term current use of insulin (HCC) - Plan: CMP14+EGFR, Hemoglobin A1c, Microalbumin, urine, metFORMIN (GLUCOPHAGE-XR) 500 MG 24 hr tablet, gabapentin (NEURONTIN) 300 MG capsule  Essential hypertension - Plan: metoprolol tartrate (LOPRESSOR) 100 MG tablet, lisinopril (PRINIVIL,ZESTRIL) 10 MG tablet, hydrochlorothiazide (MICROZIDE) 12.5 MG capsule  Pure hypercholesterolemia - Plan: fenofibrate 54 MG tablet, atorvastatin (LIPITOR) 80 MG tablet   Refilled medications - recheck in 3 months  Windell Hummingbird PA-C  [facility] 08/09/2016 8:30 AM

## 2016-08-09 NOTE — Patient Instructions (Signed)
     IF you received an x-[place] today, you will receive an invoice from [facility]. Please contact [facility] at [phone] with questions or concerns regarding your invoice.   IF you received labwork today, you will receive an invoice from [facility]. Please contact [facility] at [phone] with questions or concerns regarding your invoice.   Our billing staff will not be able to assist you with questions regarding bills from these companies.  You will be contacted with the lab results as soon as they are available. The fastest way to get your results is to activate your My Chart account. Instructions are located on the last page of this paperwork. If you have not heard from us regarding the results in 2 weeks, please contact this office.     

## 2016-08-10 LAB — CMP14+EGFR
ALT: 19 IU/L (ref 0–44)
AST: 21 IU/L (ref 0–40)
Albumin/Globulin Ratio: 1.8 (ref 1.2–2.2)
Albumin: 4.4 g/dL (ref 3.6–4.8)
Alkaline Phosphatase: 101 IU/L (ref 39–117)
BUN/Creatinine Ratio: 19 (ref 10–24)
BUN: 18 mg/dL (ref 8–27)
Bilirubin Total: 0.3 mg/dL (ref 0.0–1.2)
CO2: 22 mmol/L (ref 20–29)
Calcium: 9.6 mg/dL (ref 8.6–10.2)
Chloride: 103 mmol/L (ref 96–106)
Creatinine, Ser: 0.95 mg/dL (ref 0.76–1.27)
GFR calc Af Amer: 98 mL/min/{1.73_m2} (ref >59)
GFR calc non Af Amer: 85 mL/min/{1.73_m2} (ref >59)
Globulin, Total: 2.5 g/dL (ref 1.5–4.5)
Glucose: 137 mg/dL — ABNORMAL HIGH (ref 65–99)
Potassium: 5.2 mmol/L (ref 3.5–5.2)
Sodium: 142 mmol/L (ref 134–144)
Total Protein: 6.9 g/dL (ref 6.0–8.5)

## 2016-08-10 LAB — HEMOGLOBIN A1C
Est. average glucose Bld gHb Est-mCnc: 160 mg/dL
Hgb A1c MFr Bld: 7.2 % — ABNORMAL HIGH (ref 4.8–5.6)

## 2016-08-10 LAB — MICROALBUMIN, URINE: Microalbumin, Urine: <3 ug/mL

## 2016-08-10 NOTE — Telephone Encounter (Signed)
meds refilled at visit

## 2016-09-12 ENCOUNTER — Other Ambulatory Visit: Payer: Self-pay | Admitting: Physician Assistant

## 2016-09-12 DIAGNOSIS — I10 Essential (primary) hypertension: Secondary | ICD-10-CM

## 2016-09-12 DIAGNOSIS — E7800 Pure hypercholesterolemia, unspecified: Secondary | ICD-10-CM

## 2016-11-09 ENCOUNTER — Other Ambulatory Visit: Payer: Self-pay | Admitting: Physician Assistant

## 2016-11-09 DIAGNOSIS — I10 Essential (primary) hypertension: Secondary | ICD-10-CM

## 2016-11-09 DIAGNOSIS — E119 Type 2 diabetes mellitus without complications: Secondary | ICD-10-CM

## 2016-11-10 NOTE — Telephone Encounter (Signed)
mychart message sent to pt about making an apt °

## 2016-11-15 DIAGNOSIS — E118 Type 2 diabetes mellitus with unspecified complications: Secondary | ICD-10-CM | POA: Diagnosis not present

## 2017-01-13 ENCOUNTER — Other Ambulatory Visit: Payer: Self-pay | Admitting: Physician Assistant

## 2017-01-13 DIAGNOSIS — I10 Essential (primary) hypertension: Secondary | ICD-10-CM

## 2017-01-13 DIAGNOSIS — E119 Type 2 diabetes mellitus without complications: Secondary | ICD-10-CM

## 2017-01-14 ENCOUNTER — Other Ambulatory Visit: Payer: Self-pay

## 2017-01-14 ENCOUNTER — Other Ambulatory Visit: Payer: Self-pay | Admitting: Physician Assistant

## 2017-01-14 DIAGNOSIS — I10 Essential (primary) hypertension: Secondary | ICD-10-CM

## 2017-01-14 MED ORDER — HYDROCHLOROTHIAZIDE 12.5 MG PO CAPS: ORAL_CAPSULE | ORAL | 0 refills | Status: DC

## 2017-01-16 ENCOUNTER — Ambulatory Visit: Payer: 59 | Admitting: Physician Assistant

## 2017-01-16 ENCOUNTER — Other Ambulatory Visit: Payer: Self-pay

## 2017-01-16 ENCOUNTER — Encounter: Payer: Self-pay | Admitting: Physician Assistant

## 2017-01-16 VITALS — BP 152/80 | HR 77 | Temp 98.3°F | Resp 16 | Ht 70.0 in | Wt 183.0 lb

## 2017-01-16 DIAGNOSIS — Z23 Encounter for immunization: Secondary | ICD-10-CM

## 2017-01-16 DIAGNOSIS — E119 Type 2 diabetes mellitus without complications: Secondary | ICD-10-CM | POA: Diagnosis not present

## 2017-01-16 DIAGNOSIS — E1142 Type 2 diabetes mellitus with diabetic polyneuropathy: Secondary | ICD-10-CM | POA: Diagnosis not present

## 2017-01-16 DIAGNOSIS — E7800 Pure hypercholesterolemia, unspecified: Secondary | ICD-10-CM | POA: Diagnosis not present

## 2017-01-16 DIAGNOSIS — I10 Essential (primary) hypertension: Secondary | ICD-10-CM

## 2017-01-16 MED ORDER — ATORVASTATIN CALCIUM 80 MG PO TABS: 80.0000 mg | ORAL_TABLET | Freq: Every day | ORAL | 3 refills | Status: DC

## 2017-01-16 MED ORDER — HYDROCHLOROTHIAZIDE 12.5 MG PO CAPS: 12.5000 mg | ORAL_CAPSULE | Freq: Every day | ORAL | 1 refills | Status: DC

## 2017-01-16 MED ORDER — LISINOPRIL 10 MG PO TABS: 10.0000 mg | ORAL_TABLET | Freq: Every day | ORAL | 1 refills | Status: DC

## 2017-01-16 MED ORDER — METFORMIN HCL ER 500 MG PO TB24: ORAL_TABLET | ORAL | 1 refills | Status: DC

## 2017-01-16 MED ORDER — METOPROLOL TARTRATE 100 MG PO TABS: 100.0000 mg | ORAL_TABLET | Freq: Two times a day (BID) | ORAL | 1 refills | Status: DC

## 2017-01-16 MED ORDER — GABAPENTIN 300 MG PO CAPS: 300.0000 mg | ORAL_CAPSULE | Freq: Three times a day (TID) | ORAL | 1 refills | Status: DC

## 2017-01-16 NOTE — Patient Instructions (Addendum)
Diabetes Mellitus and Food It is important for you to manage your blood sugar (glucose) level. Your blood glucose level can be greatly affected by what you eat. Eating healthier foods in the appropriate amounts throughout the day at about the same time each day will help you control your blood glucose level. It can also help slow or prevent worsening of your diabetes mellitus. Healthy eating may even help you improve the level of your blood pressure and reach or maintain a healthy weight. General recommendations for healthful eating and cooking habits include:  Eating meals and snacks regularly. Avoid going long periods of time without eating to lose weight.  Eating a diet that consists mainly of plant-based foods, such as fruits, vegetables, nuts, legumes, and whole grains.  Using low-heat cooking methods, such as baking, instead of high-heat cooking methods, such as deep frying.  Work with your dietitian to make sure you understand how to use the Nutrition Facts information on food labels. How can food affect me? Carbohydrates Carbohydrates affect your blood glucose level more than any other type of food. Your dietitian will help you determine how many carbohydrates to eat at each meal and teach you how to count carbohydrates. Counting carbohydrates is important to keep your blood glucose at a healthy level, especially if you are using insulin or taking certain medicines for diabetes mellitus. Alcohol Alcohol can cause sudden decreases in blood glucose (hypoglycemia), especially if you use insulin or take certain medicines for diabetes mellitus. Hypoglycemia can be a life-threatening condition. Symptoms of hypoglycemia (sleepiness, dizziness, and disorientation) are similar to symptoms of having too much alcohol. If your health care provider has given you approval to drink alcohol, do so in moderation and use the following guidelines:  Women should not have more than one drink per day, and men  should not have more than two drinks per day. One drink is equal to: ? 12 oz of beer. ? 5 oz of wine. ? 1 oz of hard liquor.  Do not drink on an empty stomach.  Keep yourself hydrated. Have water, diet soda, or unsweetened iced tea.  Regular soda, juice, and other mixers might contain a lot of carbohydrates and should be counted.  What foods are not recommended? As you make food choices, it is important to remember that all foods are not the same. Some foods have fewer nutrients per serving than other foods, even though they might have the same number of calories or carbohydrates. It is difficult to get your body what it needs when you eat foods with fewer nutrients. Examples of foods that you should avoid that are high in calories and carbohydrates but low in nutrients include:  Trans fats (most processed foods list trans fats on the Nutrition Facts label).  Regular soda.  Juice.  Candy.  Sweets, such as cake, pie, doughnuts, and cookies.  Fried foods.  What foods can I eat? Eat nutrient-rich foods, which will nourish your body and keep you healthy. The food you should eat also will depend on several factors, including:  The calories you need.  The medicines you take.  Your weight.  Your blood glucose level.  Your blood pressure level.  Your cholesterol level.  You should eat a variety of foods, including:  Protein. ? Lean cuts of meat. ? Proteins low in saturated fats, such as fish, egg whites, and beans. Avoid processed meats.  Fruits and vegetables. ? Fruits and vegetables that may help control blood glucose levels, such as apples,   mangoes, and yams.  Dairy products. ? Choose fat-free or low-fat dairy products, such as milk, yogurt, and cheese.  Grains, bread, pasta, and rice. ? Choose whole grain products, such as multigrain bread, whole oats, and brown rice. These foods may help control blood pressure.  Fats. ? Foods containing healthful fats, such as  nuts, avocado, olive oil, canola oil, and fish.  Does everyone with diabetes mellitus have the same meal plan? Because every person with diabetes mellitus is different, there is not one meal plan that works for everyone. It is very important that you meet with a dietitian who will help you create a meal plan that is just right for you. This information is not intended to replace advice given to you by your health care provider. Make sure you discuss any questions you have with your health care provider. Document Released: 10/28/2004 Document Revised: 07/09/2015 Document Reviewed: 12/28/2012 Elsevier Interactive Patient Education  2017 Elsevier Inc.    IF you received an x-Alexandr today, you will receive an invoice from [facility]. Please contact [facility] at [phone] with questions or concerns regarding your invoice.   IF you received labwork today, you will receive an invoice from [facility]. Please contact [facility] at [phone] with questions or concerns regarding your invoice.   Our billing staff will not be able to assist you with questions regarding bills from these companies.  You will be contacted with the lab results as soon as they are available. The fastest way to get your results is to activate your My Chart account. Instructions are located on the last page of this paperwork. If you have not heard from us regarding the results in 2 weeks, please contact this office.      

## 2017-01-16 NOTE — Progress Notes (Signed)
PRIMARY CARE AT [facility] [address], [place] [address] [phone]  Date:  01/16/2017   Name:  Seth Pope   DOB:  1950/08/05   MRN:  [id number]  PCP:  Mancel Bale, PA-C    History of Present Illness:  Seth Pope is a 66 y.o. male patient who presents to PCP with  Chief Complaint  Patient presents with  . Medication Refill    BP meds     HTN and DM2: he states he is eating good.  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  He gets vegetables every day.  He does eat red meat such as steaks.  He does not eat fried foods.  Does not have canned or frozen dinners.  He reports that he vaguely remembers 119 as a glucose check, but does not check it often.   No chest pains, palpitations, sob, or dyspnea.  He has mild tingling, without pain.  He has no trips or falls  Wt Readings from Last 3 Encounters:  01/16/17 183 lb (83 kg)  08/09/16 183 lb 9.6 oz (83.3 kg)  05/10/16 189 lb 3.2 oz (85.8 kg)    Patient Active Problem List   Diagnosis Date Noted  . Anxiety 01/29/2016  . Bradycardia 11/13/2013  . Family history of coronary artery disease in father 10/23/2013  . Chest pain with moderate risk of acute coronary syndrome 10/23/2013  . Shortness of breath 10/17/2013  . Tobacco abuse 10/17/2013  . Unstable angina ([place]) 10/04/2013  . PVC's (premature ventricular contractions) 10/04/2013  . Right groin pain 05/22/2012  . Neuropathy, diabetic ([place]) 01/31/2012  . Hypertension   . DM2 (diabetes mellitus, type 2) ([place])   . Hyperlipidemia     Past Medical History:  Diagnosis Date  . Anxiety   . CAD (coronary artery disease)    a. cath 10/23/13: nonobstructive CAD, nl LV function, rec risk factor modification  . Chronic kidney disease    h/o kidney stone  . Diabetes mellitus    type 2  . History of pneumonia   . Hyperlipidemia   . Hypertension   . Sinus bradycardia    a. as low as 36 bpm; b. multiple pauses 2.1-2.3 seconds  . Ulcer   . Umbilical  hernia     Past Surgical History:  Procedure Laterality Date  . BACK SURGERY    . COLONOSCOPY    . KNEE SURGERY Right 1970  . LEFT HEART CATHETERIZATION WITH CORONARY ANGIOGRAM N/A 10/23/2013   Procedure: LEFT HEART CATHETERIZATION WITH CORONARY ANGIOGRAM;  Surgeon: Peter M Martinique, MD;  Location: [facility] CATH LAB;  Service: Cardiovascular;  Laterality: N/A;  . MICROLARYNGOSCOPY N/A 09/23/2015   Procedure: MICROLARYNGOSCOPY REMOVAL OF LESION;  Surgeon: Izora Gala, MD;  Location: [place] [facility];  Service: ENT;  Laterality: N/A;  . NM [place]  08/2010   bruce myoview - normal perfusion in all regions, EF 66%, EKG negative for ischemia, no wall motion abnormalities, normal/low risk study                                       . ROTATOR CUFF REPAIR Bilateral 1991   x2  . UMBILICAL HERNIA REPAIR  1988    Social History   Tobacco Use  . Smoking status: Former Smoker    Packs/day: 2.50    Years: 18.00    Pack  years: 45.00    Types: Cigarettes    Last attempt to quit: 07/16/1986    Years since quitting: 30.5  . Smokeless tobacco: Never Used  Substance Use Topics  . Alcohol use: Yes    Comment: social  . Drug use: Yes    Types: Marijuana    Comment: 2-3 times a week    Family History  Problem Relation Age of Onset  . Heart attack Father   . Hypertension Father   . Heart disease Father   . Diabetes Sister        HTN  . Diabetes Brother        HTN, heart problems  . Hypertension Unknown   . Diabetes Paternal Grandmother   . Heart disease Paternal Grandfather     Allergies  Allergen Reactions  . Penicillins Rash    Has patient had a PCN reaction causing immediate rash, facial/tongue/throat swelling, SOB or lightheadedness with hypotension: Yes Has patient had a PCN reaction causing severe rash involving mucus membranes or skin necrosis: No Has patient had a PCN reaction that required hospitalization No Has patient had a PCN reaction occurring within the last 10 years: No If  all of the above answers are "NO", then may proceed with Cephalosporin use.     Medication list has been reviewed and updated.  Current Outpatient Medications on File Prior to Visit  Medication Sig Dispense Refill  . ALPRAZolam (XANAX XR) 1 MG 24 hr tablet Take 1 mg by mouth daily.    Marland Kitchen ALPRAZolam (XANAX) 0.25 MG tablet TK ONE T PO PRN D  0  . aspirin 81 MG tablet Take 81 mg by mouth every other day.     Marland Kitchen atorvastatin (LIPITOR) 80 MG tablet Take 1 tablet (80 mg total) by mouth daily. 90 tablet 0  . cyclobenzaprine (FLEXERIL) 5 MG tablet Take 1 tablet (5 mg total) by mouth 3 (three) times daily as needed for muscle spasms. 60 tablet 0  . desipramine (NORPRAMIN) 50 MG tablet Take 150 mg by mouth at bedtime.     . fenofibrate 54 MG tablet Take 1 tablet (54 mg total) by mouth daily. with food    Office visit needed with food 90 tablet 0  . gabapentin (NEURONTIN) 300 MG capsule Take 1 capsule (300 mg total) by mouth 3 (three) times daily. 270 capsule 1  . hydrochlorothiazide (MICROZIDE) 12.5 MG capsule TAKE ONE CAPSULE BY MOUTH EVERY DAY 30 capsule 0  . hydrochlorothiazide (MICROZIDE) 12.5 MG capsule TAKE 1 CAPSULE BY MOUTH EVERY DAY. OFFICE VISIT REQUIRED FOR ADDITIONAL FILLS. 90 capsule 0  . ibuprofen (ADVIL,MOTRIN) 200 MG tablet Take 400 mg by mouth every 6 (six) hours as needed for headache.    . ketoconazole (NIZORAL) 2 % shampoo Apply 1 application topically 2 (two) times a week.  2  . lisinopril (PRINIVIL,ZESTRIL) 10 MG tablet Pt. needs office visit for more refills. 15 tablet 0  . metFORMIN (GLUCOPHAGE-XR) 500 MG 24 hr tablet Take 2 tablets by mouth in the morning and 1 tablet by mouth in the evening.   Pt. needs office visit for more refills. 45 tablet 0  . metoprolol tartrate (LOPRESSOR) 100 MG tablet Take 1 tablet (100 mg total) by mouth 2 (two) times daily. TAKE 1 TABLET(100 MG) BY MOUTH TWICE DAILY   Office visit needed for refills 180 tablet 0  . nitroGLYCERIN (NITROSTAT) 0.4 MG SL  tablet Place one under the tongue as needed for chest pain. Repeat in 5 minutes  if no effect. Call 911 if you need to take the medication 30 tablet 3  . ONETOUCH DELICA LANCETS FINE MISC USE AS DIRECTED TO CHECK BLOOD GLUCOSE UP TO THREE TIMES DAILY 100 each 0  . ONETOUCH VERIO test strip USE TO CHECK BLOOD SUGAR UP TO THREE TIMES DAILY. 100 each 0   No current facility-administered medications on file prior to visit.     ROS ROS otherwise unremarkable unless listed above.  Physical Examination: BP (!) 152/80   Pulse 77   Temp 98.3 F (36.8 C) (Oral)   Resp 16   Ht '5\' 10"'$  (1.778 m)   Wt 183 lb (83 kg)   SpO2 97%   BMI 26.26 kg/m  Ideal Body Weight: Weight in (lb) to have BMI = 25: 173.9  Physical Exam  Constitutional: He is oriented to person, place, and time. He appears well-developed and well-nourished. No distress.  HENT:  Head: Normocephalic and atraumatic.  Eyes: Conjunctivae and EOM are normal. Pupils are equal, round, and reactive to light.  Cardiovascular: Normal rate and regular rhythm. Exam reveals no friction rub.  No murmur heard. Pulmonary/Chest: Effort normal. No respiratory distress.  Neurological: He is alert and oriented to person, place, and time.  Skin: Skin is warm and dry. He is not diaphoretic.  Psychiatric: He has a normal mood and affect. His behavior is normal.     Assessment and Plan: Seth Pope is a 66 y.o. male who is here today for cc of  Chief Complaint  Patient presents with  . Medication Refill    BP meds   Essential hypertension - Plan: Hemoglobin A1c, CMP14+EGFR, hydrochlorothiazide (MICROZIDE) 12.5 MG capsule, metoprolol tartrate (LOPRESSOR) 100 MG tablet, lisinopril (PRINIVIL,ZESTRIL) 10 MG tablet  Type 2 diabetes mellitus with diabetic polyneuropathy, without long-term current use of insulin ([place]) - Plan: HM Diabetes Foot Exam  Type 2 diabetes mellitus without complication, without long-term current use of insulin ([facility]) - Plan:  metFORMIN (GLUCOPHAGE-XR) 500 MG 24 hr tablet, gabapentin (NEURONTIN) 300 MG capsule  Pure hypercholesterolemia - Plan: Lipid panel, atorvastatin (LIPITOR) 80 MG tablet  Need for prophylactic vaccination and inoculation against influenza - Plan: Flu Vaccine QUAD 36+ mos IM, CANCELED: POC Influenza A&B(BINAX/QUICKVUE)  Ivar Drape, PA-C [facility] 12/3/20182:23 PM

## 2017-01-16 NOTE — Telephone Encounter (Signed)
Md appt 01/16/17; please give new scripts

## 2017-01-17 LAB — CMP14+EGFR
ALT: 19 IU/L (ref 0–44)
AST: 17 IU/L (ref 0–40)
Albumin/Globulin Ratio: 2 (ref 1.2–2.2)
Albumin: 4.5 g/dL (ref 3.6–4.8)
Alkaline Phosphatase: 76 IU/L (ref 39–117)
BUN/Creatinine Ratio: 15 (ref 10–24)
BUN: 16 mg/dL (ref 8–27)
Bilirubin Total: 0.5 mg/dL (ref 0.0–1.2)
CO2: 22 mmol/L (ref 20–29)
Calcium: 9.3 mg/dL (ref 8.6–10.2)
Chloride: 102 mmol/L (ref 96–106)
Creatinine, Ser: 1.05 mg/dL (ref 0.76–1.27)
GFR calc Af Amer: 86 mL/min/{1.73_m2} (ref >59)
GFR calc non Af Amer: 75 mL/min/{1.73_m2} (ref >59)
Globulin, Total: 2.2 g/dL (ref 1.5–4.5)
Glucose: 149 mg/dL — ABNORMAL HIGH (ref 65–99)
Potassium: 4.3 mmol/L (ref 3.5–5.2)
Sodium: 143 mmol/L (ref 134–144)
Total Protein: 6.7 g/dL (ref 6.0–8.5)

## 2017-01-17 LAB — LIPID PANEL
Chol/HDL Ratio: 2.6 ratio (ref 0.0–5.0)
Cholesterol, Total: 105 mg/dL (ref 100–199)
HDL: 41 mg/dL (ref >39)
LDL Calculated: 49 mg/dL (ref 0–99)
Triglycerides: 77 mg/dL (ref 0–149)
VLDL Cholesterol Cal: 15 mg/dL (ref 5–40)

## 2017-01-17 LAB — HEMOGLOBIN A1C
Est. average glucose Bld gHb Est-mCnc: 157 mg/dL
Hgb A1c MFr Bld: 7.1 % — ABNORMAL HIGH (ref 4.8–5.6)

## 2017-02-06 ENCOUNTER — Ambulatory Visit: Payer: 59 | Admitting: Family Medicine

## 2017-02-06 ENCOUNTER — Encounter: Payer: Self-pay | Admitting: Family Medicine

## 2017-02-06 ENCOUNTER — Other Ambulatory Visit: Payer: Self-pay

## 2017-02-06 VITALS — BP 122/78 | HR 66 | Temp 97.9°F | Resp 16 | Ht 70.0 in | Wt 186.0 lb

## 2017-02-06 DIAGNOSIS — M47816 Spondylosis without myelopathy or radiculopathy, lumbar region: Secondary | ICD-10-CM

## 2017-02-06 DIAGNOSIS — B029 Zoster without complications: Secondary | ICD-10-CM | POA: Diagnosis not present

## 2017-02-06 DIAGNOSIS — S39012A Strain of muscle, fascia and tendon of lower back, initial encounter: Secondary | ICD-10-CM | POA: Diagnosis not present

## 2017-02-06 MED ORDER — VALACYCLOVIR HCL 1 G PO TABS: 1000.0000 mg | ORAL_TABLET | Freq: Three times a day (TID) | ORAL | 0 refills | Status: DC

## 2017-02-06 MED ORDER — TRAMADOL HCL 50 MG PO TABS: 50.0000 mg | ORAL_TABLET | Freq: Four times a day (QID) | ORAL | 0 refills | Status: DC | PRN

## 2017-02-06 MED ORDER — CYCLOBENZAPRINE HCL 5 MG PO TABS
ORAL_TABLET | ORAL | 1 refills | Status: DC
Start: 2017-02-06 — End: 2017-03-08

## 2017-02-06 NOTE — Patient Instructions (Addendum)
Take valacyclovir 1 pill 3 times daily for the shingles  Take tramadol 1 every 6 or 8 hours if needed for bad pain.  If you take acetaminophen (Tylenol) 500 mg 1 pill along with the tramadol you will find that it has a even better pain relieving effect.  Take the ibuprofen in addition to this if the pain is too much.  The maximum ibuprofen you can take is 3 or 4 pills 3 times daily for a total of no more than 2400 mg in 24 hours.  Always take it with food, and discontinue if it bothers your stomach.  You can purchase some over-the-counter Zostrix (or a similar generic) at the pharmacy to use on the area of shingles rash and discomfort.  Ask the pharmacist if he cannot locate it.  If the back is feeling too tight from this muscle strain, you can increase the Flexeril to 5 mg 1-2 pills 3 times daily, but this only needs to be taken on an as-needed basis for muscle relaxant  Return if not improving    IF you received an x-Doy today, you will receive an invoice from [facility]. Please contact [facility] at [phone] with questions or concerns regarding your invoice.   IF you received labwork today, you will receive an invoice from [facility]. Please contact [facility] at [phone] with questions or concerns regarding your invoice.   Our billing staff will not be able to assist you with questions regarding bills from these companies.  You will be contacted with the lab results as soon as they are available. The fastest way to get your results is to activate your My Chart account. Instructions are located on the last page of this paperwork. If you have not heard from [place] regarding the results in 2 weeks, please contact this office.

## 2017-02-06 NOTE — Progress Notes (Signed)
Patient ID: Marchelle Folks, male    DOB: 07/21/1950  Age: 66 y.o. MRN: [id number]  Chief Complaint  Patient presents with  . Back Pain    going around to the side/x 3 days  . Rash    on the side/ possible shingles    Subjective:   66 year old man who has a history of old back problems, has had a plate in his lower spine in the lumbar region.  Last week he was removing a car type carrier, using a screwdriver on top of a car while standing in the door frame.  He had to press extremely hard and he noticed a pain develop in his back.  It is persisted since that time.  The last day or 2 he has noticed a hypersensitivity of the skin when he touches it in the right flank and right abdominal regions.  Then yesterday he developed some early red rash.  He has a history of having had shingles many years ago.  He had on hand a prescription of Flexeril that Dr. Everlene Farrier given him, and he has been taking some of that.  He has taken Tylenol and ibuprofen.  Current allergies, medications, problem list, past/family and social histories reviewed.  Objective:  BP 122/78   Pulse 66   Temp 97.9 F (36.6 C) (Oral)   Resp 16   Ht 5\' 10"  (1.778 m)   Wt 186 lb (84.4 kg)   SpO2 99%   BMI 26.69 kg/m   No major distress.  Alert and oriented.  He has described pain in the right flank area from mid back around to just above the umbilical region.  There are a half dozen or more little erythematous blotchy areas most of which are less than about 2 cm.  The or in a radicular pattern.  Along about the he 9 or 10 dermatome.  He has no blistering yet.  Range of motion of his spine is fairly good.  A little tender in the lower lumbar region where his chronic back pain is.  Assessment & Plan:   Assessment: 1. Herpes zoster without complication   2. Back strain, initial encounter   3. Osteoarthritis of lumbar spine, unspecified spinal osteoarthritis complication status       Plan: It appears that he has strained his back  and then that somehow has triggered a flare of zoster.  We will treat accordingly for both.  See instructions.  No orders of the defined types were placed in this encounter.   Meds ordered this encounter  Medications  . traMADol (ULTRAM) 50 MG tablet    Sig: Take 1 tablet (50 mg total) by mouth every 6 (six) hours as needed.    Dispense:  20 tablet    Refill:  0  . valACYclovir (VALTREX) 1000 MG tablet    Sig: Take 1 tablet (1,000 mg total) by mouth 3 (three) times daily.    Dispense:  21 tablet    Refill:  0  . cyclobenzaprine (FLEXERIL) 5 MG tablet    Sig: Take 1 or 2 pills 3 times daily when needed for muscle relaxant.    Dispense:  60 tablet    Refill:  1         Patient Instructions   Take valacyclovir 1 pill 3 times daily for the shingles  Take tramadol 1 every 6 or 8 hours if needed for bad pain.  If you take acetaminophen (Tylenol) 500 mg 1 pill along with the tramadol  you will find that it has a even better pain relieving effect.  Take the ibuprofen in addition to this if the pain is too much.  The maximum ibuprofen you can take is 3 or 4 pills 3 times daily for a total of no more than 2400 mg in 24 hours.  Always take it with food, and discontinue if it bothers your stomach.  You can purchase some over-the-counter Zostrix (or a similar generic) at the pharmacy to use on the area of shingles rash and discomfort.  Ask the pharmacist if he cannot locate it.  If the back is feeling too tight from this muscle strain, you can increase the Flexeril to 5 mg 1-2 pills 3 times daily, but this only needs to be taken on an as-needed basis for muscle relaxant  Return if not improving    IF you received an x-Arek today, you will receive an invoice from [facility]. Please contact [facility] at [phone] with questions or concerns regarding your invoice.   IF you received labwork today, you will receive an invoice from [facility]. Please contact [facility] at  [phone] with questions or concerns regarding your invoice.   Our billing staff will not be able to assist you with questions regarding bills from these companies.  You will be contacted with the lab results as soon as they are available. The fastest way to get your results is to activate your My Chart account. Instructions are located on the last page of this paperwork. If you have not heard from [place] regarding the results in 2 weeks, please contact this office.         No Follow-up on file.   HOPPER,DAVID, MD 02/06/2017

## 2017-02-20 ENCOUNTER — Other Ambulatory Visit: Payer: Self-pay | Admitting: Physician Assistant

## 2017-02-20 DIAGNOSIS — E7800 Pure hypercholesterolemia, unspecified: Secondary | ICD-10-CM

## 2017-02-21 ENCOUNTER — Telehealth: Payer: Self-pay | Admitting: Physician Assistant

## 2017-02-21 NOTE — Telephone Encounter (Signed)
Copied from [place] [id number]. Topic: Quick Communication - See Telephone Encounter >> Feb 21, 2017  1:05 PM Corie Chiquito, [place] wrote: CRM for notification. See Telephone encounter for: Patient calling because he needs a refill on his Fenofibrate. If someone could give him a call back at [phone]  02/21/17.

## 2017-02-21 NOTE — Telephone Encounter (Signed)
Patient is requesting refill, last OV 01/16/17, prescription notes says he needs an office visit, need clarity on whether to refill.

## 2017-02-22 ENCOUNTER — Other Ambulatory Visit: Payer: Self-pay | Admitting: Physician Assistant

## 2017-02-22 DIAGNOSIS — E7800 Pure hypercholesterolemia, unspecified: Secondary | ICD-10-CM

## 2017-02-22 NOTE — Telephone Encounter (Signed)
Rx sent in

## 2017-02-24 ENCOUNTER — Ambulatory Visit: Payer: 59 | Admitting: Emergency Medicine

## 2017-02-24 ENCOUNTER — Encounter: Payer: Self-pay | Admitting: Emergency Medicine

## 2017-02-24 ENCOUNTER — Other Ambulatory Visit: Payer: Self-pay

## 2017-02-24 ENCOUNTER — Ambulatory Visit (INDEPENDENT_AMBULATORY_CARE_PROVIDER_SITE_OTHER): Payer: 59

## 2017-02-24 VITALS — BP 112/62 | HR 66 | Temp 98.4°F | Resp 16 | Ht 68.5 in | Wt 185.2 lb

## 2017-02-24 DIAGNOSIS — S299XXA Unspecified injury of thorax, initial encounter: Secondary | ICD-10-CM | POA: Diagnosis not present

## 2017-02-24 DIAGNOSIS — R0781 Pleurodynia: Secondary | ICD-10-CM | POA: Diagnosis not present

## 2017-02-24 DIAGNOSIS — S20221A Contusion of right back wall of thorax, initial encounter: Secondary | ICD-10-CM | POA: Diagnosis not present

## 2017-02-24 MED ORDER — TRAMADOL HCL 50 MG PO TABS
50.0000 mg | ORAL_TABLET | Freq: Three times a day (TID) | ORAL | 0 refills | Status: DC | PRN
Start: 2017-02-24 — End: 2017-05-02

## 2017-02-24 NOTE — Patient Instructions (Addendum)
     IF you received an x-[place] today, you will receive an invoice from [facility]. Please contact [facility] at [phone] with questions or concerns regarding your invoice.   IF you received labwork today, you will receive an invoice from [facility]. Please contact [facility] at [phone] with questions or concerns regarding your invoice.   Our billing staff will not be able to assist you with questions regarding bills from these companies.  You will be contacted with the lab results as soon as they are available. The fastest way to get your results is to activate your My Chart account. Instructions are located on the last page of this paperwork. If you have not heard from us regarding the results in 2 weeks, please contact this office.     Contusion A contusion is a deep bruise. Contusions happen when an injury causes bleeding under the skin. Symptoms of bruising include pain, swelling, and discolored skin. The skin may turn blue, purple, or yellow. Follow these instructions at home:  Rest the injured area.  If told, put ice on the injured area. ? Put ice in a plastic bag. ? Place a towel between your skin and the bag. ? Leave the ice on for 20 minutes, 2-3 times per day.  If told, put light pressure (compression) on the injured area using an elastic bandage. Make sure the bandage is not too tight. Remove it and put it back on as told by your doctor.  If possible, raise (elevate) the injured area above the level of your heart while you are sitting or lying down.  Take over-the-counter and prescription medicines only as told by your doctor. Contact a doctor if:  Your symptoms do not get better after several days of treatment.  Your symptoms get worse.  You have trouble moving the injured area. Get help right away if:  You have very bad pain.  You have a loss of feeling (numbness) in a hand or foot.  Your hand or foot turns pale or cold. This information  is not intended to replace advice given to you by your health care provider. Make sure you discuss any questions you have with your health care provider. Document Released: 07/20/2007 Document Revised: 07/09/2015 Document Reviewed: 06/18/2014 Elsevier Interactive Patient Education  2018 Elsevier Inc.  

## 2017-02-24 NOTE — Progress Notes (Signed)
Seth Pope 67 y.o.   Chief Complaint  Patient presents with  . Fall    02/23/2017 injury to RIGHT HIP  . Back Pain    LOWER on the RIGHT    HISTORY OF PRESENT ILLNESS: This is a 67 y.o. male complaining of pain to right lower posterior rib cage since falling on the ground yesterday.  HPI   Prior to Admission medications   Medication Sig Start Date End Date Taking? Authorizing Provider  ALPRAZolam (XANAX XR) 1 MG 24 hr tablet Take 1 mg by mouth daily.   Yes [provider]  ALPRAZolam (XANAX) 0.25 MG tablet TK ONE T PO PRN D 03/16/16  Yes [provider]  atorvastatin (LIPITOR) 80 MG tablet Take 1 tablet (80 mg total) by mouth daily. 01/16/17  Yes English, Colletta Maryland D, PA  cyclobenzaprine (FLEXERIL) 5 MG tablet Take 1 or 2 pills 3 times daily when needed for muscle relaxant. 02/06/17  Yes Posey Boyer, MD  desipramine (NORPRAMIN) 50 MG tablet Take 150 mg by mouth at bedtime.    Yes [provider]  fenofibrate 54 MG tablet TAKE 1 TABLET BY MOUTH DAILY WITH FOOD, OFFICE VISIT NEEDED 02/22/17  Yes Weber, Sarah L, PA-C  gabapentin (NEURONTIN) 300 MG capsule Take 1 capsule (300 mg total) by mouth 3 (three) times daily. Fill at patient's request 01/16/17  Yes English, Stephanie D, PA  hydrochlorothiazide (MICROZIDE) 12.5 MG capsule TAKE 1 CAPSULE BY MOUTH EVERY DAY. OFFICE VISIT REQUIRED FOR ADDITIONAL FILLS. 01/16/17  Yes Weber, Sarah L, PA-C  ibuprofen (ADVIL,MOTRIN) 200 MG tablet Take 400 mg by mouth every 6 (six) hours as needed for headache.   Yes [provider]  lisinopril (PRINIVIL,ZESTRIL) 10 MG tablet Take 1 tablet (10 mg total) by mouth daily. 01/16/17  Yes English, Colletta Maryland D, PA  metFORMIN (GLUCOPHAGE-XR) 500 MG 24 hr tablet Take 2 tablets by mouth in the morning and 1 tablet by mouth in the evening.   Fill at patient's request 01/16/17  Yes English, Stanton D, PA  metoprolol tartrate (LOPRESSOR) 100 MG tablet Take 1 tablet (100 mg total) by  mouth 2 (two) times daily. TAKE 1 TABLET(100 MG) BY MOUTH TWICE DAILY 01/16/17  Yes English, Stephanie D, PA  nitroGLYCERIN (NITROSTAT) 0.4 MG SL tablet Place one under the tongue as needed for chest pain. Repeat in 5 minutes if no effect. Call 911 if you need to take the medication 10/05/13  Yes Daub, Loura Back, MD  aspirin 81 MG tablet Take 81 mg by mouth every other day.     [provider]  hydrochlorothiazide (MICROZIDE) 12.5 MG capsule Take 1 capsule (12.5 mg total) by mouth daily. 01/16/17   Ivar Drape D, PA  ketoconazole (NIZORAL) 2 % shampoo Apply 1 application topically 2 (two) times a week. 09/09/15   [provider]  [place] USE AS DIRECTED TO CHECK BLOOD GLUCOSE UP TO THREE TIMES DAILY 12/14/15   Wardell Honour, MD  ONETOUCH VERIO test strip USE TO CHECK BLOOD SUGAR UP TO THREE TIMES DAILY. 12/17/15   Harrison Mons, PA-C  traMADol (ULTRAM) 50 MG tablet Take 1 tablet (50 mg total) by mouth every 6 (six) hours as needed. Patient not taking: Reported on 02/24/2017 02/06/17   Posey Boyer, MD  valACYclovir (VALTREX) 1000 MG tablet Take 1 tablet (1,000 mg total) by mouth 3 (three) times daily. Patient not taking: Reported on 02/24/2017 02/06/17   Posey Boyer, MD  Allergies  Allergen Reactions  . Penicillins Rash    Has patient had a PCN reaction causing immediate rash, facial/tongue/throat swelling, SOB or lightheadedness with hypotension: Yes Has patient had a PCN reaction causing severe rash involving mucus membranes or skin necrosis: No Has patient had a PCN reaction that required hospitalization No Has patient had a PCN reaction occurring within the last 10 years: No If all of the above answers are "NO", then may proceed with Cephalosporin use.     Patient Active Problem List   Diagnosis Date Noted  . Anxiety 01/29/2016  . Bradycardia 11/13/2013  . Family history of coronary artery disease in father 10/23/2013  . Chest  pain with moderate risk of acute coronary syndrome 10/23/2013  . Shortness of breath 10/17/2013  . Tobacco abuse 10/17/2013  . Unstable angina ([place]) 10/04/2013  . PVC's (premature ventricular contractions) 10/04/2013  . Right groin pain 05/22/2012  . Neuropathy, diabetic ([place]) 01/31/2012  . Hypertension   . DM2 (diabetes mellitus, type 2) ([place])   . Hyperlipidemia     Past Medical History:  Diagnosis Date  . Anxiety   . CAD (coronary artery disease)    a. cath 10/23/13: nonobstructive CAD, nl LV function, rec risk factor modification  . Chronic kidney disease    h/o kidney stone  . Diabetes mellitus    type 2  . History of pneumonia   . Hyperlipidemia   . Hypertension   . Sinus bradycardia    a. as low as 36 bpm; b. multiple pauses 2.1-2.3 seconds  . Ulcer   . Umbilical hernia     Past Surgical History:  Procedure Laterality Date  . BACK SURGERY    . COLONOSCOPY    . KNEE SURGERY Right 1970  . LEFT HEART CATHETERIZATION WITH CORONARY ANGIOGRAM N/A 10/23/2013   Procedure: LEFT HEART CATHETERIZATION WITH CORONARY ANGIOGRAM;  Surgeon: Peter M Martinique, MD;  Location: [facility] CATH LAB;  Service: Cardiovascular;  Laterality: N/A;  . MICROLARYNGOSCOPY N/A 09/23/2015   Procedure: MICROLARYNGOSCOPY REMOVAL OF LESION;  Surgeon: Izora Gala, MD;  Location: [facility];  Service: ENT;  Laterality: N/A;  . NM [place]  08/2010   bruce myoview - normal perfusion in all regions, EF 66%, EKG negative for ischemia, no wall motion abnormalities, normal/low risk study                                       . ROTATOR CUFF REPAIR Bilateral 1991   x2  . UMBILICAL HERNIA REPAIR  1988    Social History   Socioeconomic History  . Marital status: Married    Spouse name: Not on file  . Number of children: 2  . Years of education: 10th   . Highest education level: Not on file  Social Needs  . Financial resource strain: Not on file  . Food insecurity - worry: Not on file  . Food insecurity -  inability: Not on file  . Transportation needs - medical: Not on file  . Transportation needs - non-medical: Not on file  Occupational History  . Occupation: self-employed (Investment banker, operational)  Tobacco Use  . Smoking status: Former Smoker    Packs/day: 2.50    Years: 18.00    Pack years: 45.00    Types: Cigarettes    Last attempt to quit: 07/16/1986    Years since quitting: 30.6  . Smokeless tobacco: Never  Used  Substance and Sexual Activity  . Alcohol use: Yes    Comment: social  . Drug use: Yes    Types: Marijuana    Comment: 2-3 times a week  . Sexual activity: No  Other Topics Concern  . Not on file  Social History Narrative   Lives at home w/ his wife   Right-handed   Daily caffeine    Family History  Problem Relation Age of Onset  . Heart attack Father   . Hypertension Father   . Heart disease Father   . Diabetes Sister        HTN  . Diabetes Brother        HTN, heart problems  . Hypertension Unknown   . Diabetes Paternal Grandmother   . Heart disease Paternal Grandfather      Review of Systems  Constitutional: Negative.  Negative for chills and fever.  HENT: Negative.   Eyes: Negative.   Respiratory: Negative for cough and shortness of breath.   Cardiovascular: Negative.  Negative for chest pain and palpitations.  Gastrointestinal: Negative.  Negative for abdominal pain, blood in stool, diarrhea, nausea and vomiting.  Genitourinary: Negative.  Negative for dysuria and hematuria.  Musculoskeletal: Positive for back pain. Negative for myalgias.  Skin: Negative for rash.  Neurological: Negative.  Negative for dizziness and headaches.  Endo/Heme/Allergies: Negative.   All other systems reviewed and are negative.  Vitals:   02/24/17 1623  BP: 112/62  Pulse: 66  Resp: 16  Temp: 98.4 F (36.9 C)  SpO2: 94%     Physical Exam  Constitutional: He is oriented to person, place, and time. He appears well-developed and well-nourished.  HENT:  Head:  Normocephalic and atraumatic.  Nose: Nose normal.  Mouth/Throat: Oropharynx is clear and moist.  Eyes: Conjunctivae and EOM are normal. Pupils are equal, round, and reactive to light.  Neck: Normal range of motion. Neck supple. No JVD present. No thyromegaly present.  Cardiovascular: Normal rate, regular rhythm and normal heart sounds.  Pulmonary/Chest: Effort normal and breath sounds normal.  +tenderness to right posterior lower rib cage  Abdominal: Soft. Bowel sounds are normal. He exhibits no distension. There is no tenderness.  Lymphadenopathy:    He has no cervical adenopathy.  Neurological: He is alert and oriented to person, place, and time. No sensory deficit. He exhibits normal muscle tone. Coordination normal.  Skin: Skin is warm and dry. Capillary refill takes less than 2 seconds.  Psychiatric: He has a normal mood and affect.  Vitals reviewed.  Dg Ribs Unilateral W/chest Right  Result Date: 02/24/2017 CLINICAL DATA:  Trauma.  Right lower rib pain EXAM: RIGHT RIBS AND CHEST - 3+ VIEW COMPARISON:  10/23/2013 FINDINGS: No fracture or other bone lesions are seen involving the ribs. There is no evidence of pneumothorax or pleural effusion. Both lungs are clear. Heart size and mediastinal contours are within normal limits. IMPRESSION: Negative. Electronically Signed   By: Franchot Gallo M.D.   On: 02/24/2017 16:54   A total of 25 minutes was spent in the room with the patient, greater than 50% of which was in counseling/coordination of care.   ASSESSMENT & PLAN: Seth Pope was seen today for fall and back pain.  Diagnoses and all orders for this visit:  Rib pain on right side -     DG Ribs Unilateral W/Chest Right; Future -     traMADol (ULTRAM) 50 MG tablet; Take 1 tablet (50 mg total) by mouth every 8 (eight) hours  as needed.  Back contusion, right, initial encounter    Patient Instructions       IF you received an x-Diondre today, you will receive an invoice from [facility]. Please contact [facility] at [phone] with questions or concerns regarding your invoice.   IF you received labwork today, you will receive an invoice from [facility]. Please contact [facility] at [phone] with questions or concerns regarding your invoice.   Our billing staff will not be able to assist you with questions regarding bills from these companies.  You will be contacted with the lab results as soon as they are available. The fastest way to get your results is to activate your My Chart account. Instructions are located on the last page of this paperwork. If you have not heard from [place] regarding the results in 2 weeks, please contact this office.     Contusion A contusion is a deep bruise. Contusions happen when an injury causes bleeding under the skin. Symptoms of bruising include pain, swelling, and discolored skin. The skin may turn blue, purple, or yellow. Follow these instructions at home:  Rest the injured area.  If told, put ice on the injured area. ? Put ice in a plastic bag. ? Place a towel between your skin and the bag. ? Leave the ice on for 20 minutes, 2-3 times per day.  If told, put light pressure (compression) on the injured area using an elastic bandage. Make sure the bandage is not too tight. Remove it and put it back on as told by your doctor.  If possible, raise (elevate) the injured area above the level of your heart while you are sitting or lying down.  Take over-the-counter and prescription medicines only as told by your doctor. Contact a doctor if:  Your symptoms do not get better after several days of treatment.  Your symptoms get worse.  You have trouble moving the injured area. Get help right away if:  You have very bad pain.  You have a loss of feeling (numbness) in a hand or foot.  Your hand or foot turns pale or cold. This information is not intended to replace advice given to you by your health care provider. Make  sure you discuss any questions you have with your health care provider. Document Released: 07/20/2007 Document Revised: 07/09/2015 Document Reviewed: 06/18/2014 Elsevier Interactive Patient Education  2018 Elsevier Inc.      Agustina Caroli, MD [facility]

## 2017-02-27 NOTE — Telephone Encounter (Signed)
Pt needs OV per [facility] AVS.  Copied from [place]. Topic: Inquiry >> Feb 27, 2017 10:46 AM Conception Chancy, NT wrote: Reason for CRM: patient says he was seen 3 weeks ago 02/06/17 for the shingles and he states he still has a tingly feeling on his side. He would like to know if he needs another round of antibiotic. Please advise. He is requesting a call back from Weber or her nurse.

## 2017-02-27 NOTE — Telephone Encounter (Signed)
Called pt - scheduled him for OV tomorrow 02/28/17 with Dr. Mitchel Honour

## 2017-02-28 ENCOUNTER — Encounter: Payer: Self-pay | Admitting: Emergency Medicine

## 2017-02-28 ENCOUNTER — Ambulatory Visit (INDEPENDENT_AMBULATORY_CARE_PROVIDER_SITE_OTHER): Payer: 59 | Admitting: Emergency Medicine

## 2017-02-28 VITALS — BP 124/82 | HR 66 | Temp 98.2°F | Resp 17 | Ht 68.5 in | Wt 189.0 lb

## 2017-02-28 DIAGNOSIS — Z8619 Personal history of other infectious and parasitic diseases: Secondary | ICD-10-CM | POA: Diagnosis not present

## 2017-02-28 DIAGNOSIS — R0781 Pleurodynia: Secondary | ICD-10-CM | POA: Diagnosis not present

## 2017-02-28 DIAGNOSIS — R109 Unspecified abdominal pain: Secondary | ICD-10-CM | POA: Insufficient documentation

## 2017-02-28 DIAGNOSIS — B0229 Other postherpetic nervous system involvement: Secondary | ICD-10-CM | POA: Insufficient documentation

## 2017-02-28 NOTE — Patient Instructions (Addendum)
Continue Tramadol as needed.   IF you received an x-Danyel today, you will receive an invoice from [facility]. Please contact [facility] at [phone] with questions or concerns regarding your invoice.   IF you received labwork today, you will receive an invoice from [facility]. Please contact [facility] at [phone] with questions or concerns regarding your invoice.   Our billing staff will not be able to assist you with questions regarding bills from these companies.  You will be contacted with the lab results as soon as they are available. The fastest way to get your results is to activate your My Chart account. Instructions are located on the last page of this paperwork. If you have not heard from [place] regarding the results in 2 weeks, please contact this office.     Postherpetic Neuralgia Postherpetic neuralgia (PHN) is nerve pain that occurs after a shingles infection. Shingles is a painful rash that appears on one side of the body, usually on your trunk or face. Shingles is caused by the varicella-zoster virus. This is the same virus that causes chickenpox. In people who have had chickenpox, the virus can resurface years later and cause shingles. You may have PHN if you continue to have pain for 3 months after your shingles rash has gone away. PHN appears in the same area where you had the shingles rash. For most people, PHN goes away within 1 year. Getting a vaccination for shingles can prevent PHN. This vaccine is recommended for people older than 50. It may prevent shingles and may also lower your risk of PHN if you do get shingles. What are the causes? PHN is caused by damage to your nerves from the varicella-zoster virus. This damage makes your nerves overly sensitive. What increases the risk? Aging is the biggest risk factor for developing PHN. Most people who get PHN are older than 5. Other risk factors include:  Having very bad pain before your shingles rash  starts.  Having a very bad rash.  Having shingles in the nerve that supplies your face and eye (trigeminal nerve).  What are the signs or symptoms? Pain is the main symptom of PHN. The pain is often very bad and may be described as stabbing, burning, or feeling like an electric shock. The pain may come and go or may be there all the time. Pain may be triggered by light touches on the skin or changes in temperature. You may have itching along with the pain. How is this diagnosed? Your health care provider may diagnose PHN based on your symptoms and your history of shingles. Lab studies and other diagnostic tests are usually not needed. How is this treated? There is no cure for PHN. Treatment for PHN will focus on pain relief. Over-the-counter pain relievers do not usually relieve PHN pain. You may need to work with a pain specialist. Treatment may include:  Antidepressant medicines to help with pain and improve sleep.  Antiseizure medicines to relieve nerve pain.  Strong pain relievers (opioids).  A numbing patch worn on the skin (lidocaine patch).  Follow these instructions at home: It may take a long time to recover from PHN. Work closely with your health care provider, and have a good support system at home.  Take all medicines as directed by your health care provider.  Wear loose, comfortable clothing.  Cover sensitive areas with a dressing to reduce friction from clothing rubbing on the area.  If cold does not make your pain worse, try applying a  cool compress or cooling gel pack to the area.  Talk to your health care provider if you feel depressed or desperate. Living with long-term pain can be depressing.  Contact a health care provider if:  Your medicine is not helping.  You are struggling to manage your pain at home. This information is not intended to replace advice given to you by your health care provider. Make sure you discuss any questions you have with your health  care provider. Document Released: 04/23/2002 Document Revised: 07/09/2015 Document Reviewed: 01/22/2013 Elsevier Interactive Patient Education  [employer].

## 2017-02-28 NOTE — Progress Notes (Signed)
Seth Pope 67 y.o.   Chief Complaint  Patient presents with  . Follow-up  . Herpes Zoster    HISTORY OF PRESENT ILLNESS: This is a 68 y.o. male complaining of pain to right flank area radiating to front; chronic pain since 12/24 when he was seen here by Dr. Linna Darner for shingles; seen by me 1/11 for same pain following fall/injury several days earlier; thought maybe he needed more Valtrex. No rash. No new symptoms.  HPI   Prior to Admission medications   Medication Sig Start Date End Date Taking? Authorizing Provider  ALPRAZolam (XANAX XR) 1 MG 24 hr tablet Take 1 mg by mouth daily.   Yes [provider]  ALPRAZolam (XANAX) 0.25 MG tablet TK ONE T PO PRN D 03/16/16  Yes [provider]  aspirin 81 MG tablet Take 81 mg by mouth every other day.    Yes [provider]  atorvastatin (LIPITOR) 80 MG tablet Take 1 tablet (80 mg total) by mouth daily. 01/16/17  Yes English, Colletta Maryland D, PA  cyclobenzaprine (FLEXERIL) 5 MG tablet Take 1 or 2 pills 3 times daily when needed for muscle relaxant. 02/06/17  Yes Posey Boyer, MD  desipramine (NORPRAMIN) 50 MG tablet Take 150 mg by mouth at bedtime.    Yes [provider]  fenofibrate 54 MG tablet TAKE 1 TABLET BY MOUTH DAILY WITH FOOD, OFFICE VISIT NEEDED 02/25/17  Yes Jeffery, Chelle, PA-C  fenofibrate 54 MG tablet TAKE 1 TABLET BY MOUTH DAILY WITH FOOD, OFFICE VISIT NEEDED 02/22/17  Yes Weber, Sarah L, PA-C  gabapentin (NEURONTIN) 300 MG capsule Take 1 capsule (300 mg total) by mouth 3 (three) times daily. Fill at patient's request 01/16/17  Yes English, Stephanie D, PA  hydrochlorothiazide (MICROZIDE) 12.5 MG capsule TAKE 1 CAPSULE BY MOUTH EVERY DAY. OFFICE VISIT REQUIRED FOR ADDITIONAL FILLS. 01/16/17  Yes Weber, Damaris Hippo, PA-C  hydrochlorothiazide (MICROZIDE) 12.5 MG capsule Take 1 capsule (12.5 mg total) by mouth daily. 01/16/17  Yes English, Colletta Maryland D, PA  ibuprofen (ADVIL,MOTRIN) 200 MG tablet Take 400 mg by  mouth every 6 (six) hours as needed for headache.   Yes [provider]  ketoconazole (NIZORAL) 2 % shampoo Apply 1 application topically 2 (two) times a week. 09/09/15  Yes [provider]  lisinopril (PRINIVIL,ZESTRIL) 10 MG tablet Take 1 tablet (10 mg total) by mouth daily. 01/16/17  Yes English, Colletta Maryland D, PA  metFORMIN (GLUCOPHAGE-XR) 500 MG 24 hr tablet Take 2 tablets by mouth in the morning and 1 tablet by mouth in the evening.   Fill at patient's request 01/16/17  Yes English, Bristol D, PA  metoprolol tartrate (LOPRESSOR) 100 MG tablet Take 1 tablet (100 mg total) by mouth 2 (two) times daily. TAKE 1 TABLET(100 MG) BY MOUTH TWICE DAILY 01/16/17  Yes English, Stephanie D, PA  nitroGLYCERIN (NITROSTAT) 0.4 MG SL tablet Place one under the tongue as needed for chest pain. Repeat in 5 minutes if no effect. Call 911 if you need to take the medication 10/05/13  Yes Daub, Loura Back, MD  [facility] DELICA LANCETS FINE MISC USE AS DIRECTED TO CHECK BLOOD GLUCOSE UP TO THREE TIMES DAILY 12/14/15  Yes Wardell Honour, MD  [facility] VERIO test strip USE TO CHECK BLOOD SUGAR UP TO THREE TIMES DAILY. 12/17/15  Yes Jeffery, Chelle, PA-C  traMADol (ULTRAM) 50 MG tablet Take 1 tablet (50 mg total) by mouth every 6 (six) hours as needed. 02/06/17  Yes Posey Boyer,  MD  traMADol (ULTRAM) 50 MG tablet Take 1 tablet (50 mg total) by mouth every 8 (eight) hours as needed. 02/24/17  Yes Deitrich Steve, Ines Bloomer, MD  valACYclovir (VALTREX) 1000 MG tablet Take 1 tablet (1,000 mg total) by mouth 3 (three) times daily. 02/06/17  Yes Posey Boyer, MD    Allergies  Allergen Reactions  . Penicillins Rash    Has patient had a PCN reaction causing immediate rash, facial/tongue/throat swelling, SOB or lightheadedness with hypotension: Yes Has patient had a PCN reaction causing severe rash involving mucus membranes or skin necrosis: No Has patient had a PCN reaction that required hospitalization No Has  patient had a PCN reaction occurring within the last 10 years: No If all of the above answers are "NO", then may proceed with Cephalosporin use.     Patient Active Problem List   Diagnosis Date Noted  . Anxiety 01/29/2016  . Bradycardia 11/13/2013  . Family history of coronary artery disease in father 10/23/2013  . Chest pain with moderate risk of acute coronary syndrome 10/23/2013  . Shortness of breath 10/17/2013  . Tobacco abuse 10/17/2013  . Unstable angina ([place]) 10/04/2013  . PVC's (premature ventricular contractions) 10/04/2013  . Right groin pain 05/22/2012  . Neuropathy, diabetic ([place]) 01/31/2012  . Hypertension   . DM2 (diabetes mellitus, type 2) ([place])   . Hyperlipidemia     Past Medical History:  Diagnosis Date  . Anxiety   . CAD (coronary artery disease)    a. cath 10/23/13: nonobstructive CAD, nl LV function, rec risk factor modification  . Chronic kidney disease    h/o kidney stone  . Diabetes mellitus    type 2  . History of pneumonia   . Hyperlipidemia   . Hypertension   . Sinus bradycardia    a. as low as 36 bpm; b. multiple pauses 2.1-2.3 seconds  . Ulcer   . Umbilical hernia     Past Surgical History:  Procedure Laterality Date  . BACK SURGERY    . COLONOSCOPY    . KNEE SURGERY Right 1970  . LEFT HEART CATHETERIZATION WITH CORONARY ANGIOGRAM N/A 10/23/2013   Procedure: LEFT HEART CATHETERIZATION WITH CORONARY ANGIOGRAM;  Surgeon: Peter M Martinique, MD;  Location: [facility] CATH LAB;  Service: Cardiovascular;  Laterality: N/A;  . MICROLARYNGOSCOPY N/A 09/23/2015   Procedure: MICROLARYNGOSCOPY REMOVAL OF LESION;  Surgeon: Izora Gala, MD;  Location: [facility];  Service: ENT;  Laterality: N/A;  . NM [place]  08/2010   bruce myoview - normal perfusion in all regions, EF 66%, EKG negative for ischemia, no wall motion abnormalities, normal/low risk study                                       . ROTATOR CUFF REPAIR Bilateral 1991   x2  . UMBILICAL HERNIA  REPAIR  1988    Social History   Socioeconomic History  . Marital status: Married    Spouse name: Not on file  . Number of children: 2  . Years of education: 10th   . Highest education level: Not on file  Social Needs  . Financial resource strain: Not on file  . Food insecurity - worry: Not on file  . Food insecurity - inability: Not on file  . Transportation needs - medical: Not on file  . Transportation needs - non-medical: Not on file  Occupational History  .  Occupation: self-employed (Investment banker, operational)  Tobacco Use  . Smoking status: Former Smoker    Packs/day: 2.50    Years: 18.00    Pack years: 45.00    Types: Cigarettes    Last attempt to quit: 07/16/1986    Years since quitting: 30.6  . Smokeless tobacco: Never Used  Substance and Sexual Activity  . Alcohol use: Yes    Comment: social  . Drug use: Yes    Types: Marijuana    Comment: 2-3 times a week  . Sexual activity: No  Other Topics Concern  . Not on file  Social History Narrative   Lives at home w/ his wife   Right-handed   Daily caffeine    Family History  Problem Relation Age of Onset  . Heart attack Father   . Hypertension Father   . Heart disease Father   . Diabetes Sister        HTN  . Diabetes Brother        HTN, heart problems  . Hypertension Unknown   . Diabetes Paternal Grandmother   . Heart disease Paternal Grandfather      Review of Systems  Constitutional: Negative.  Negative for chills and fever.  HENT: Negative.   Eyes: Negative.   Respiratory: Negative for cough and shortness of breath.   Cardiovascular: Negative for chest pain and palpitations.  Gastrointestinal: Negative for abdominal pain, blood in stool, diarrhea, nausea and vomiting.  Genitourinary: Positive for flank pain. Negative for dysuria and hematuria.  Musculoskeletal: Positive for back pain.  Skin: Negative for rash.  Neurological: Negative.  Negative for dizziness, sensory change, focal weakness and  headaches.  Endo/Heme/Allergies: Negative.   All other systems reviewed and are negative.  Vitals:   02/28/17 0958  BP: 124/82  Pulse: 66  Resp: 17  Temp: 98.2 F (36.8 C)  SpO2: 98%     Physical Exam  Constitutional: He is oriented to person, place, and time. He appears well-developed and well-nourished.  HENT:  Head: Normocephalic and atraumatic.  Nose: Nose normal.  Mouth/Throat: Oropharynx is clear and moist.  Eyes: EOM are normal. Pupils are equal, round, and reactive to light.  Neck: Normal range of motion. Neck supple.  Cardiovascular: Normal rate and regular rhythm.  Pulmonary/Chest: Effort normal and breath sounds normal.  Abdominal: Soft. There is no tenderness.  Musculoskeletal: Normal range of motion.  Neurological: He is alert and oriented to person, place, and time. No sensory deficit. He exhibits normal muscle tone.  Skin: Skin is warm and dry. No rash noted.  Psychiatric: He has a normal mood and affect. His behavior is normal.  Vitals reviewed.    ASSESSMENT & PLAN: [place] was seen today for follow-up and herpes zoster.  Diagnoses and all orders for this visit:  Rib pain on right side  History of shingles Comments: recent  Post herpetic neuralgia  Flank pain   Patient Instructions    Continue Tramadol as needed.   IF you received an x-[place] today, you will receive an invoice from [facility]. Please contact [facility] at [phone] with questions or concerns regarding your invoice.   IF you received labwork today, you will receive an invoice from [place]. Please contact [facility] at [phone] with questions or concerns regarding your invoice.   Our billing staff will not be able to assist you with questions regarding bills from these companies.  You will be contacted with the lab results as soon as they are available. The fastest way to  get your results is to activate your My Chart account. Instructions are located on  the last page of this paperwork. If you have not heard from [place] regarding the results in 2 weeks, please contact this office.     Postherpetic Neuralgia Postherpetic neuralgia (PHN) is nerve pain that occurs after a shingles infection. Shingles is a painful rash that appears on one side of the body, usually on your trunk or face. Shingles is caused by the varicella-zoster virus. This is the same virus that causes chickenpox. In people who have had chickenpox, the virus can resurface years later and cause shingles. You may have PHN if you continue to have pain for 3 months after your shingles rash has gone away. PHN appears in the same area where you had the shingles rash. For most people, PHN goes away within 1 year. Getting a vaccination for shingles can prevent PHN. This vaccine is recommended for people older than 50. It may prevent shingles and may also lower your risk of PHN if you do get shingles. What are the causes? PHN is caused by damage to your nerves from the varicella-zoster virus. This damage makes your nerves overly sensitive. What increases the risk? Aging is the biggest risk factor for developing PHN. Most people who get PHN are older than 6. Other risk factors include:  Having very bad pain before your shingles rash starts.  Having a very bad rash.  Having shingles in the nerve that supplies your face and eye (trigeminal nerve).  What are the signs or symptoms? Pain is the main symptom of PHN. The pain is often very bad and may be described as stabbing, burning, or feeling like an electric shock. The pain may come and go or may be there all the time. Pain may be triggered by light touches on the skin or changes in temperature. You may have itching along with the pain. How is this diagnosed? Your health care provider may diagnose PHN based on your symptoms and your history of shingles. Lab studies and other diagnostic tests are usually not needed. How is this treated? There  is no cure for PHN. Treatment for PHN will focus on pain relief. Over-the-counter pain relievers do not usually relieve PHN pain. You may need to work with a pain specialist. Treatment may include:  Antidepressant medicines to help with pain and improve sleep.  Antiseizure medicines to relieve nerve pain.  Strong pain relievers (opioids).  A numbing patch worn on the skin (lidocaine patch).  Follow these instructions at home: It may take a long time to recover from PHN. Work closely with your health care provider, and have a good support system at home.  Take all medicines as directed by your health care provider.  Wear loose, comfortable clothing.  Cover sensitive areas with a dressing to reduce friction from clothing rubbing on the area.  If cold does not make your pain worse, try applying a cool compress or cooling gel pack to the area.  Talk to your health care provider if you feel depressed or desperate. Living with long-term pain can be depressing.  Contact a health care provider if:  Your medicine is not helping.  You are struggling to manage your pain at home. This information is not intended to replace advice given to you by your health care provider. Make sure you discuss any questions you have with your health care provider. Document Released: 04/23/2002 Document Revised: 07/09/2015 Document Reviewed: 01/22/2013 Elsevier Interactive Patient Education  2018  Elsevier Inc.      Agustina Caroli, MD [facility]

## 2017-03-08 ENCOUNTER — Ambulatory Visit (INDEPENDENT_AMBULATORY_CARE_PROVIDER_SITE_OTHER): Payer: 59

## 2017-03-08 ENCOUNTER — Other Ambulatory Visit: Payer: Self-pay

## 2017-03-08 ENCOUNTER — Encounter: Payer: Self-pay | Admitting: Physician Assistant

## 2017-03-08 ENCOUNTER — Ambulatory Visit (INDEPENDENT_AMBULATORY_CARE_PROVIDER_SITE_OTHER): Payer: 59 | Admitting: Physician Assistant

## 2017-03-08 VITALS — BP 110/62 | HR 66 | Temp 98.6°F | Resp 20 | Ht 70.16 in | Wt 186.0 lb

## 2017-03-08 DIAGNOSIS — B0229 Other postherpetic nervous system involvement: Secondary | ICD-10-CM

## 2017-03-08 DIAGNOSIS — M5136 Other intervertebral disc degeneration, lumbar region: Secondary | ICD-10-CM | POA: Diagnosis not present

## 2017-03-08 DIAGNOSIS — M545 Low back pain, unspecified: Secondary | ICD-10-CM

## 2017-03-08 DIAGNOSIS — M47816 Spondylosis without myelopathy or radiculopathy, lumbar region: Secondary | ICD-10-CM

## 2017-03-08 MED ORDER — IBUPROFEN 800 MG PO TABS: 800.0000 mg | ORAL_TABLET | Freq: Three times a day (TID) | ORAL | 0 refills | Status: DC | PRN

## 2017-03-08 MED ORDER — CYCLOBENZAPRINE HCL 5 MG PO TABS: ORAL_TABLET | ORAL | 1 refills | Status: DC

## 2017-03-08 MED ORDER — GABAPENTIN 100 MG PO CAPS: 100.0000 mg | ORAL_CAPSULE | Freq: Three times a day (TID) | ORAL | 0 refills | Status: DC

## 2017-03-08 MED ORDER — HYDROCODONE-ACETAMINOPHEN 5-325 MG PO TABS: 1.0000 | ORAL_TABLET | Freq: Four times a day (QID) | ORAL | 0 refills | Status: DC | PRN

## 2017-03-08 NOTE — Progress Notes (Signed)
Seth Pope  MRN: [id number] DOB: 12-31-1950  PCP: Mancel Bale, PA-C  Chief Complaint  Patient presents with  . Back Pain    X 2days- lower back due to heavy lifting    Subjective:  Pt presents to clinic for 2 concerns  Burning pain in the area he had shingles.  Sensation has chnaged and it is burning at times.  Overall it does not seem to be getting better.  He wants to know if he should be doing anything for the pain.  He has been using motrin and tylenol.    Yesterday he was moving a table and he felt a "pop" in his lower back.  Better to stand.  Change in positions is worse.  Worse with sitting down.  Pain is in the center of the back.  Surgery with a steel cage with Dr Ellene Route >15 years ago and really no problems with the area.  Pain into back of legs down to knees but nothing passed knees or anterior legs.  No paresthesias or weakness.   History is obtained by patient.  Review of Systems  Musculoskeletal: Positive for back pain. Negative for gait problem.  Neurological: Negative for weakness.    Patient Active Problem List   Diagnosis Date Noted  . Rib pain on right side 02/28/2017  . History of shingles 02/28/2017  . Post herpetic neuralgia 02/28/2017  . Flank pain 02/28/2017  . Anxiety 01/29/2016  . Bradycardia 11/13/2013  . Family history of coronary artery disease in father 10/23/2013  . Chest pain with moderate risk of acute coronary syndrome 10/23/2013  . Shortness of breath 10/17/2013  . Tobacco abuse 10/17/2013  . Unstable angina ([place]) 10/04/2013  . PVC's (premature ventricular contractions) 10/04/2013  . Right groin pain 05/22/2012  . Neuropathy, diabetic ([place]) 01/31/2012  . Hypertension   . DM2 (diabetes mellitus, type 2) ([place])   . Hyperlipidemia     Current Outpatient Medications on File Prior to Visit  Medication Sig Dispense Refill  . ALPRAZolam (XANAX XR) 1 MG 24 hr tablet Take 1 mg by mouth daily.    Marland Kitchen ALPRAZolam (XANAX) 0.25 MG tablet TK ONE  T PO PRN D  0  . aspirin 81 MG tablet Take 81 mg by mouth every other day.     Marland Kitchen atorvastatin (LIPITOR) 80 MG tablet Take 1 tablet (80 mg total) by mouth daily. 90 tablet 3  . desipramine (NORPRAMIN) 50 MG tablet Take 150 mg by mouth at bedtime.     . fenofibrate 54 MG tablet TAKE 1 TABLET BY MOUTH DAILY WITH FOOD, OFFICE VISIT NEEDED 90 tablet 0  . fenofibrate 54 MG tablet TAKE 1 TABLET BY MOUTH DAILY WITH FOOD, OFFICE VISIT NEEDED 90 tablet 0  . gabapentin (NEURONTIN) 300 MG capsule Take 1 capsule (300 mg total) by mouth 3 (three) times daily. Fill at patient's request 270 capsule 1  . hydrochlorothiazide (MICROZIDE) 12.5 MG capsule TAKE 1 CAPSULE BY MOUTH EVERY DAY. OFFICE VISIT REQUIRED FOR ADDITIONAL FILLS. 90 capsule 0  . hydrochlorothiazide (MICROZIDE) 12.5 MG capsule Take 1 capsule (12.5 mg total) by mouth daily. 90 capsule 1  . ibuprofen (ADVIL,MOTRIN) 200 MG tablet Take 400 mg by mouth every 6 (six) hours as needed for headache.    . ketoconazole (NIZORAL) 2 % shampoo Apply 1 application topically 2 (two) times a week.  2  . lisinopril (PRINIVIL,ZESTRIL) 10 MG tablet Take 1 tablet (10 mg total) by mouth daily. 90 tablet 1  .  metFORMIN (GLUCOPHAGE-XR) 500 MG 24 hr tablet Take 2 tablets by mouth in the morning and 1 tablet by mouth in the evening.   Fill at patient's request 270 tablet 1  . metoprolol tartrate (LOPRESSOR) 100 MG tablet Take 1 tablet (100 mg total) by mouth 2 (two) times daily. TAKE 1 TABLET(100 MG) BY MOUTH TWICE DAILY 180 tablet 1  . nitroGLYCERIN (NITROSTAT) 0.4 MG SL tablet Place one under the tongue as needed for chest pain. Repeat in 5 minutes if no effect. Call 911 if you need to take the medication 30 tablet 3  . ONETOUCH DELICA LANCETS FINE MISC USE AS DIRECTED TO CHECK BLOOD GLUCOSE UP TO THREE TIMES DAILY 100 each 0  . ONETOUCH VERIO test strip USE TO CHECK BLOOD SUGAR UP TO THREE TIMES DAILY. 100 each 0  . traMADol (ULTRAM) 50 MG tablet Take 1 tablet (50 mg total)  by mouth every 6 (six) hours as needed. 20 tablet 0  . traMADol (ULTRAM) 50 MG tablet Take 1 tablet (50 mg total) by mouth every 8 (eight) hours as needed. 20 tablet 0  . valACYclovir (VALTREX) 1000 MG tablet Take 1 tablet (1,000 mg total) by mouth 3 (three) times daily. 21 tablet 0   No current facility-administered medications on file prior to visit.     Allergies  Allergen Reactions  . Penicillins Rash    Has patient had a PCN reaction causing immediate rash, facial/tongue/throat swelling, SOB or lightheadedness with hypotension: Yes Has patient had a PCN reaction causing severe rash involving mucus membranes or skin necrosis: No Has patient had a PCN reaction that required hospitalization No Has patient had a PCN reaction occurring within the last 10 years: No If all of the above answers are "NO", then may proceed with Cephalosporin use.     Past Medical History:  Diagnosis Date  . Anxiety   . CAD (coronary artery disease)    a. cath 10/23/13: nonobstructive CAD, nl LV function, rec risk factor modification  . Chronic kidney disease    h/o kidney stone  . Diabetes mellitus    type 2  . History of pneumonia   . Hyperlipidemia   . Hypertension   . Sinus bradycardia    a. as low as 36 bpm; b. multiple pauses 2.1-2.3 seconds  . Ulcer   . Umbilical hernia    Social History   Social History Narrative   Lives at home w/ his wife   Right-handed   Daily caffeine   Social History   Tobacco Use  . Smoking status: Former Smoker    Packs/day: 2.50    Years: 18.00    Pack years: 45.00    Types: Cigarettes    Last attempt to quit: 07/16/1986    Years since quitting: 30.6  . Smokeless tobacco: Never Used  Substance Use Topics  . Alcohol use: Yes    Comment: social  . Drug use: Yes    Types: Marijuana    Comment: 2-3 times a week   family history includes Diabetes in his brother, paternal grandmother, and sister; Heart attack in his father; Heart disease in his father and  paternal grandfather; Hypertension in his father and unknown relative.     Objective:  BP 110/62 (BP Location: Left Arm, Patient Position: Sitting, Cuff Size: Large)   Pulse 66   Temp 98.6 F (37 C) (Oral)   Resp 20   Ht 5' 10.16" (1.782 m)   Wt 186 lb (84.4 kg)  SpO2 98%   BMI 26.57 kg/m  Body mass index is 26.57 kg/m.  Physical Exam  Constitutional: He is oriented to person, place, and time and well-developed, well-nourished, and in no distress.  HENT:  Head: Normocephalic and atraumatic.  Right Ear: External ear normal.  Left Ear: External ear normal.  Eyes: Conjunctivae are normal.  Neck: Normal range of motion.  Pulmonary/Chest: Effort normal.  Musculoskeletal:       Right shoulder: He exhibits decreased range of motion (slight decrease in flexion - more pain with extension, lateral flexion to the R>L causes pain on the right), pain and spasm (right side top lumbar area).       Lumbar back: He exhibits decreased range of motion, tenderness, pain and spasm.  Neurological: He is alert and oriented to person, place, and time. He has normal sensation, normal strength and normal reflexes. He displays no weakness and normal reflexes. No sensory deficit. He has a normal Straight Leg Raise Test. Gait normal. Gait normal.  Skin: Skin is warm and dry.  No rash - area on the right mid abdominal area - with sensation change with palpation -   Psychiatric: Mood, memory, affect and judgment normal.   Dg Lumbar Spine 2-3 Views  Result Date: 03/08/2017 CLINICAL DATA:  The patient felt a pop in his low back when moving a table yesterday. Initial encounter. EXAM: LUMBAR SPINE - 2-3 VIEW COMPARISON:  CT abdomen and pelvis 07/18/2009. FINDINGS: No acute abnormality is identified. Marked loss of disc space height with endplate spurring and sclerosis at L2-3 of worsened since the prior exam. The patient is status post L4-5 fusion. Aortic atherosclerosis is noted. IMPRESSION: No acute finding.  Advanced appearing degenerative disc disease L2-3 is worse than on the prior exam. Status post L4-5 fusion. Atherosclerosis. Electronically Signed   By: Inge Rise M.D.   On: 03/08/2017 11:40    Assessment and Plan :  Acute midline low back pain without sciatica - Plan: DG Lumbar Spine 2-3 Views, HYDROcodone-acetaminophen (NORCO/VICODIN) 5-325 MG tablet, ibuprofen (ADVIL,MOTRIN) 800 MG tablet  Osteoarthritis of lumbar spine, unspecified spinal osteoarthritis complication status - Plan: cyclobenzaprine (FLEXERIL) 5 MG tablet, ibuprofen (ADVIL,MOTRIN) 800 MG tablet  PHN (postherpetic neuralgia) - Plan: gabapentin (NEURONTIN) 100 MG capsule - pt is currently on gabapentin and we will increase by 100mg  tid until he gets to 600mg  tid, side effects or pain control -    Pt has significant arthritis in his lumbar spine at the area where he hurts - he likely aggravated this yesterday when he lifted ad likely twisted.  We will treat pain and inflammation - he was given warning signs on when to RTC.    Windell Hummingbird PA-C  [facility] 03/08/2017 12:31 PM

## 2017-03-08 NOTE — Patient Instructions (Addendum)
Continue taking your gabapentin as Rx - you will add 1-3 pills in addition to help with the nerve pain on the right side  Add the norco (pain medication) to help with intense pain in the middle of your back - be sure to use a stool softener to prevent constipation  Increase the Flexeril to 10mg  to 3x/day to help with muscle spasms  I will let you know the results of your xray when I get the results.  If you have any leg pain or numbness you need to see immediately    IF you received an x-[place] today, you will receive an invoice from [facility]. Please contact [facility] at [phone] with questions or concerns regarding your invoice.   IF you received labwork today, you will receive an invoice from [place]. Please contact [facility] at [phone] with questions or concerns regarding your invoice.   Our billing staff will not be able to assist you with questions regarding bills from these companies.  You will be contacted with the lab results as soon as they are available. The fastest way to get your results is to activate your My Chart account. Instructions are located on the last page of this paperwork. If you have not heard from [place] regarding the results in 2 weeks, please contact this office.

## 2017-05-02 ENCOUNTER — Other Ambulatory Visit: Payer: Self-pay

## 2017-05-02 ENCOUNTER — Ambulatory Visit: Payer: 59 | Admitting: Physician Assistant

## 2017-05-02 ENCOUNTER — Encounter: Payer: Self-pay | Admitting: Physician Assistant

## 2017-05-02 ENCOUNTER — Other Ambulatory Visit: Payer: Self-pay | Admitting: Physician Assistant

## 2017-05-02 VITALS — BP 122/64 | HR 66 | Temp 97.4°F | Resp 18 | Ht 70.6 in | Wt 190.8 lb

## 2017-05-02 DIAGNOSIS — E1142 Type 2 diabetes mellitus with diabetic polyneuropathy: Secondary | ICD-10-CM | POA: Diagnosis not present

## 2017-05-02 DIAGNOSIS — B0229 Other postherpetic nervous system involvement: Secondary | ICD-10-CM

## 2017-05-02 DIAGNOSIS — I10 Essential (primary) hypertension: Secondary | ICD-10-CM

## 2017-05-02 NOTE — Progress Notes (Signed)
WITTEN CERTAIN  MRN: [id number] DOB: May 02, 1950  PCP: Mancel Bale, PA-C  Chief Complaint  Patient presents with  . Herpes Zoster    was dx with shingles on dec. 24th and is still having pain on right side of belly     Subjective:  Pt presents to clinic for continued nerve pain on the right side on abdomin where he had shingles.  He was diagnosed with shingles on 12/24 and then pain had continued.  He does overall think it is better.  4/10 most of the time but gets up to 8/10 when it is really bad - this is when he has to pull his shirt from the skin to get relief.  At his last appt we talked about increase of gabapentin but he has been taking it intermittent - pain driven.  He finds that motrin heps some with the pain when it is bad.  It is still burning stinging pain.  He has an appt next week for his DM recheck.  He is having no problems with his medications.  His daily gabapentin does really help with his foot nerve pain.  History is obtained by patient.  Review of Systems  Skin: Negative for rash.    Patient Active Problem List   Diagnosis Date Noted  . Rib pain on right side 02/28/2017  . History of shingles 02/28/2017  . Post herpetic neuralgia 02/28/2017  . Flank pain 02/28/2017  . Anxiety 01/29/2016  . Bradycardia 11/13/2013  . Family history of coronary artery disease in father 10/23/2013  . Chest pain with moderate risk of acute coronary syndrome 10/23/2013  . Shortness of breath 10/17/2013  . Tobacco abuse 10/17/2013  . Unstable angina ([place]) 10/04/2013  . PVC's (premature ventricular contractions) 10/04/2013  . Right groin pain 05/22/2012  . Neuropathy, diabetic ([place]) 01/31/2012  . Hypertension   . DM2 (diabetes mellitus, type 2) ([place])   . Hyperlipidemia     Current Outpatient Medications on File Prior to Visit  Medication Sig Dispense Refill  . ALPRAZolam (XANAX XR) 1 MG 24 hr tablet Take 1 mg by mouth daily.    Marland Kitchen ALPRAZolam (XANAX) 0.25 MG tablet TK ONE T  PO PRN D  0  . aspirin 81 MG tablet Take 81 mg by mouth every other day.     Marland Kitchen atorvastatin (LIPITOR) 80 MG tablet Take 1 tablet (80 mg total) by mouth daily. 90 tablet 3  . cyclobenzaprine (FLEXERIL) 5 MG tablet Take 1 or 2 pills 3 times daily when needed for muscle relaxant. 90 tablet 1  . desipramine (NORPRAMIN) 50 MG tablet Take 150 mg by mouth at bedtime.     . fenofibrate 54 MG tablet TAKE 1 TABLET BY MOUTH DAILY WITH FOOD, OFFICE VISIT NEEDED 90 tablet 0  . gabapentin (NEURONTIN) 100 MG capsule Take 1-3 capsules (100-300 mg total) by mouth 3 (three) times daily. 90 capsule 0  . gabapentin (NEURONTIN) 300 MG capsule Take 1 capsule (300 mg total) by mouth 3 (three) times daily. Fill at patient's request 270 capsule 1  . hydrochlorothiazide (MICROZIDE) 12.5 MG capsule Take 1 capsule (12.5 mg total) by mouth daily. 90 capsule 1  . HYDROcodone-acetaminophen (NORCO/VICODIN) 5-325 MG tablet Take 1 tablet by mouth every 6 (six) hours as needed for moderate pain. 20 tablet 0  . ibuprofen (ADVIL,MOTRIN) 800 MG tablet Take 1 tablet (800 mg total) by mouth every 8 (eight) hours as needed. 30 tablet 0  . ketoconazole (NIZORAL) 2 %  shampoo Apply 1 application topically 2 (two) times a week.  2  . lisinopril (PRINIVIL,ZESTRIL) 10 MG tablet Take 1 tablet (10 mg total) by mouth daily. 90 tablet 1  . metFORMIN (GLUCOPHAGE-XR) 500 MG 24 hr tablet Take 2 tablets by mouth in the morning and 1 tablet by mouth in the evening.   Fill at patient's request 270 tablet 1  . metoprolol tartrate (LOPRESSOR) 100 MG tablet Take 1 tablet (100 mg total) by mouth 2 (two) times daily. TAKE 1 TABLET(100 MG) BY MOUTH TWICE DAILY 180 tablet 1  . nitroGLYCERIN (NITROSTAT) 0.4 MG SL tablet Place one under the tongue as needed for chest pain. Repeat in 5 minutes if no effect. Call 911 if you need to take the medication 30 tablet 3  . ONETOUCH DELICA LANCETS FINE MISC USE AS DIRECTED TO CHECK BLOOD GLUCOSE UP TO THREE TIMES DAILY 100  each 0  . ONETOUCH VERIO test strip USE TO CHECK BLOOD SUGAR UP TO THREE TIMES DAILY. 100 each 0  . traMADol (ULTRAM) 50 MG tablet Take 1 tablet (50 mg total) by mouth every 6 (six) hours as needed. 20 tablet 0  . valACYclovir (VALTREX) 1000 MG tablet Take 1 tablet (1,000 mg total) by mouth 3 (three) times daily. 21 tablet 0   No current facility-administered medications on file prior to visit.     Allergies  Allergen Reactions  . Penicillins Rash    Has patient had a PCN reaction causing immediate rash, facial/tongue/throat swelling, SOB or lightheadedness with hypotension: Yes Has patient had a PCN reaction causing severe rash involving mucus membranes or skin necrosis: No Has patient had a PCN reaction that required hospitalization No Has patient had a PCN reaction occurring within the last 10 years: No If all of the above answers are "NO", then may proceed with Cephalosporin use.     Past Medical History:  Diagnosis Date  . Anxiety   . CAD (coronary artery disease)    a. cath 10/23/13: nonobstructive CAD, nl LV function, rec risk factor modification  . Chronic kidney disease    h/o kidney stone  . Diabetes mellitus    type 2  . History of pneumonia   . Hyperlipidemia   . Hypertension   . Sinus bradycardia    a. as low as 36 bpm; b. multiple pauses 2.1-2.3 seconds  . Ulcer   . Umbilical hernia    Social History   Social History Narrative   Lives at home w/ his wife   Right-handed   Daily caffeine   Social History   Tobacco Use  . Smoking status: Former Smoker    Packs/day: 2.50    Years: 18.00    Pack years: 45.00    Types: Cigarettes    Last attempt to quit: 07/16/1986    Years since quitting: 30.8  . Smokeless tobacco: Never Used  Substance Use Topics  . Alcohol use: Yes    Comment: social  . Drug use: Yes    Types: Marijuana    Comment: 2-3 times a week   family history includes Diabetes in his brother, paternal grandmother, and sister; Heart attack in  his father; Heart disease in his father and paternal grandfather; Hypertension in his father and unknown relative.     Objective:  BP 122/64   Pulse 66   Temp (!) 97.4 F (36.3 C) (Oral)   Resp 18   Ht 5' 10.6" (1.793 m)   Wt 190 lb 12.8 oz (86.5 kg)  SpO2 96%   BMI 26.91 kg/m  Body mass index is 26.91 kg/m.  Physical Exam  Constitutional: He is oriented to person, place, and time and well-developed, well-nourished, and in no distress.  HENT:  Head: Normocephalic and atraumatic.  Right Ear: External ear normal.  Left Ear: External ear normal.  Eyes: Conjunctivae are normal.  Neck: Normal range of motion.  Cardiovascular: Normal rate, regular rhythm and normal heart sounds.  No murmur heard. Pulmonary/Chest: Effort normal and breath sounds normal. He has no wheezes.  Neurological: He is alert and oriented to person, place, and time. Gait normal.  Skin: Skin is warm and dry. No rash noted.     Psychiatric: Mood, memory, affect and judgment normal.    Assessment and Plan :  Diabetic polyneuropathy associated with type 2 diabetes mellitus ([place])  Type 2 diabetes mellitus with diabetic polyneuropathy, without long-term current use of insulin (HCC) - Plan: CMP14+EGFR, Hemoglobin A1c - check labs adjust medications as needed  Essential hypertension - controlled -   Post herpetic neuralgia - increase his gabapentin to help with pain control - taper was given to patient up to '600mg'$  tid - he will let me know when he reaches pain control or '600mg'$  tid.  Windell Hummingbird PA-C  [facility] at [facility] 05/02/2017 12:39 PM

## 2017-05-02 NOTE — Patient Instructions (Addendum)
Gabapentin --  Day 1-5 400mg  2x/day (am and pm) 300mg  in the afternoon  Day 6-10 400mg  3x/day  Day 10-15  500mg  3x/day  Day 16-20 600mg  3x/day  If at any stage your 8/10 gets better and less often then stop there and let me know what your dose dose   If no help at 600mg  3/day - let me know - we can still go up I just want you know     IF you received an x-[place] today, you will receive an invoice from [facility]. Please contact [facility] at [phone] with questions or concerns regarding your invoice.   IF you received labwork today, you will receive an invoice from [facility]. Please contact [facility] at [phone] with questions or concerns regarding your invoice.   Our billing staff will not be able to assist you with questions regarding bills from these companies.  You will be contacted with the lab results as soon as they are available. The fastest way to get your results is to activate your My Chart account. Instructions are located on the last page of this paperwork. If you have not heard from [place] regarding the results in 2 weeks, please contact this office.

## 2017-05-03 LAB — CMP14+EGFR
ALT: 19 IU/L (ref 0–44)
AST: 17 IU/L (ref 0–40)
Albumin/Globulin Ratio: 1.9 (ref 1.2–2.2)
Albumin: 4.5 g/dL (ref 3.6–4.8)
Alkaline Phosphatase: 87 IU/L (ref 39–117)
BUN/Creatinine Ratio: 16 (ref 10–24)
BUN: 14 mg/dL (ref 8–27)
Bilirubin Total: 0.4 mg/dL (ref 0.0–1.2)
CO2: 21 mmol/L (ref 20–29)
Calcium: 9.7 mg/dL (ref 8.6–10.2)
Chloride: 103 mmol/L (ref 96–106)
Creatinine, Ser: 0.87 mg/dL (ref 0.76–1.27)
GFR calc Af Amer: 105 mL/min/{1.73_m2} (ref >59)
GFR calc non Af Amer: 91 mL/min/{1.73_m2} (ref >59)
Globulin, Total: 2.4 g/dL (ref 1.5–4.5)
Glucose: 183 mg/dL — ABNORMAL HIGH (ref 65–99)
Potassium: 4.3 mmol/L (ref 3.5–5.2)
Sodium: 140 mmol/L (ref 134–144)
Total Protein: 6.9 g/dL (ref 6.0–8.5)

## 2017-05-03 LAB — HEMOGLOBIN A1C
Est. average glucose Bld gHb Est-mCnc: 157 mg/dL
Hgb A1c MFr Bld: 7.1 % — ABNORMAL HIGH (ref 4.8–5.6)

## 2017-05-09 ENCOUNTER — Ambulatory Visit: Payer: 59 | Admitting: Physician Assistant

## 2017-05-24 ENCOUNTER — Encounter: Payer: Self-pay | Admitting: Physician Assistant

## 2017-06-28 ENCOUNTER — Other Ambulatory Visit: Payer: Self-pay | Admitting: *Deleted

## 2017-06-28 ENCOUNTER — Other Ambulatory Visit: Payer: Self-pay | Admitting: Physician Assistant

## 2017-06-28 DIAGNOSIS — E119 Type 2 diabetes mellitus without complications: Secondary | ICD-10-CM

## 2017-06-28 MED ORDER — GABAPENTIN 300 MG PO CAPS: 300.0000 mg | ORAL_CAPSULE | Freq: Three times a day (TID) | ORAL | 1 refills | Status: DC

## 2017-06-28 NOTE — Telephone Encounter (Signed)
Copied from [place] [id number]. Topic: Quick Communication - Rx Refill/Question >> Jun 28, 2017  8:12 AM Aurelio Brash B wrote: Medication: gabapentin (NEURONTIN) 300 MG capsule  Has the patient contacted their pharmacy? No   (Agent: If no, request that the patient contact the pharmacy for the refill.)  Preferred Pharmacy (with phone number or street name): [facility] [place], [place] [phone] (Phone) [phone] (Fax)     Agent: Please be advised that RX refills may take up to 3 business days. We ask that you follow-up with your pharmacy.

## 2017-06-28 NOTE — Telephone Encounter (Signed)
Rx refilled per note 05/02/17.

## 2017-07-03 ENCOUNTER — Other Ambulatory Visit: Payer: Self-pay | Admitting: Physician Assistant

## 2017-07-03 ENCOUNTER — Telehealth: Payer: Self-pay | Admitting: Physician Assistant

## 2017-07-03 DIAGNOSIS — B0229 Other postherpetic nervous system involvement: Secondary | ICD-10-CM

## 2017-07-03 NOTE — Telephone Encounter (Signed)
Patient checking status of refill, states he had double up on med, 6 tabs daily due to shingles. Please advise.

## 2017-07-03 NOTE — Telephone Encounter (Signed)
Patient requesting Gabapentin 300 mg refill. See phone message, please advise. Thank you.

## 2017-07-03 NOTE — Telephone Encounter (Signed)
Patient called, left VM that Gabapentin was sent to pharmacy on 06/28/17 and if he has any other questions, to call the office back.  Patient will need a new prescription sent reflecting he takes 6 capsules daily at 600 mg TID. Last OV:05/02/17 Last refill:06/28/17 PRF:[facility] Pharmacy: [facility] [place], [place] AT [facility] [address] [phone] (Phone) [phone] (Fax)

## 2017-07-03 NOTE — Telephone Encounter (Signed)
Copied from [place] [id number]. Topic: Quick Communication - Rx Refill/Question >> Jul 03, 2017  1:46 PM Lennox Solders wrote: Medication gabapentin 300 mg Has the patient contacted their pharmacy? Yes  preferred Pharmacy (with phone number or street name): [facility] . Pt states he take a total of 6 pills a day 2 in morning , 2 in afternoon and 2 at night the pills are 300 mg each. Pt said provider changed the direction. Pt needs a medication with new directions

## 2017-07-04 NOTE — Telephone Encounter (Signed)
Pt was seen in the office today and Rx was refilled.

## 2017-07-04 NOTE — Telephone Encounter (Signed)
Patient is requesting a refill of the following medications: Requested Prescriptions   Pending Prescriptions Disp Refills  . gabapentin (NEURONTIN) 100 MG capsule [Pharmacy Med Name: GABAPENTIN 100MG  CAPSULES] 90 capsule 0    Sig: TAKE 1 TO 3 CAPSULES BY MOUTH THREE TIMES DAILY    Date of patient request: 07/03/17 Last office visit: 05/02/17 Date of last refill: 05/02/17 Last refill amount: 270 0RF Follow up time period per chart: none scheduled at this time     Pt is in office requesting new RX that states he can take 2 tablets 3 times daily. One was printed on 06/28/17 by Ivar Drape but pt never got it Please Advise

## 2017-07-11 ENCOUNTER — Encounter: Payer: Self-pay | Admitting: Family Medicine

## 2017-07-16 ENCOUNTER — Other Ambulatory Visit: Payer: Self-pay | Admitting: Physician Assistant

## 2017-07-16 DIAGNOSIS — I10 Essential (primary) hypertension: Secondary | ICD-10-CM

## 2017-07-18 ENCOUNTER — Other Ambulatory Visit: Payer: Self-pay

## 2017-07-18 ENCOUNTER — Encounter: Payer: Self-pay | Admitting: Physician Assistant

## 2017-07-18 ENCOUNTER — Ambulatory Visit: Payer: 59 | Admitting: Physician Assistant

## 2017-07-18 ENCOUNTER — Ambulatory Visit (INDEPENDENT_AMBULATORY_CARE_PROVIDER_SITE_OTHER): Payer: 59

## 2017-07-18 VITALS — BP 104/60 | HR 54 | Temp 97.8°F | Resp 18 | Ht 70.6 in | Wt 187.6 lb

## 2017-07-18 DIAGNOSIS — M79645 Pain in left finger(s): Secondary | ICD-10-CM

## 2017-07-18 DIAGNOSIS — M79644 Pain in right finger(s): Secondary | ICD-10-CM

## 2017-07-18 DIAGNOSIS — B0229 Other postherpetic nervous system involvement: Secondary | ICD-10-CM | POA: Diagnosis not present

## 2017-07-18 DIAGNOSIS — E7800 Pure hypercholesterolemia, unspecified: Secondary | ICD-10-CM

## 2017-07-18 DIAGNOSIS — I10 Essential (primary) hypertension: Secondary | ICD-10-CM

## 2017-07-18 DIAGNOSIS — E119 Type 2 diabetes mellitus without complications: Secondary | ICD-10-CM | POA: Diagnosis not present

## 2017-07-18 DIAGNOSIS — M19041 Primary osteoarthritis, right hand: Secondary | ICD-10-CM | POA: Diagnosis not present

## 2017-07-18 LAB — CMP14+EGFR
ALT: 20 IU/L (ref 0–44)
AST: 18 IU/L (ref 0–40)
Albumin/Globulin Ratio: 2 (ref 1.2–2.2)
Albumin: 4.5 g/dL (ref 3.6–4.8)
Alkaline Phosphatase: 85 IU/L (ref 39–117)
BUN/Creatinine Ratio: 21 (ref 10–24)
BUN: 21 mg/dL (ref 8–27)
Bilirubin Total: 0.4 mg/dL (ref 0.0–1.2)
CO2: 23 mmol/L (ref 20–29)
Calcium: 9.3 mg/dL (ref 8.6–10.2)
Chloride: 102 mmol/L (ref 96–106)
Creatinine, Ser: 1.01 mg/dL (ref 0.76–1.27)
GFR calc Af Amer: 90 mL/min/{1.73_m2} (ref >59)
GFR calc non Af Amer: 78 mL/min/{1.73_m2} (ref >59)
Globulin, Total: 2.3 g/dL (ref 1.5–4.5)
Glucose: 175 mg/dL — ABNORMAL HIGH (ref 65–99)
Potassium: 4.3 mmol/L (ref 3.5–5.2)
Sodium: 140 mmol/L (ref 134–144)
Total Protein: 6.8 g/dL (ref 6.0–8.5)

## 2017-07-18 LAB — LIPID PANEL
Chol/HDL Ratio: 2.9 ratio (ref 0.0–5.0)
Cholesterol, Total: 117 mg/dL (ref 100–199)
HDL: 40 mg/dL (ref >39)
LDL Calculated: 58 mg/dL (ref 0–99)
Triglycerides: 96 mg/dL (ref 0–149)
VLDL Cholesterol Cal: 19 mg/dL (ref 5–40)

## 2017-07-18 LAB — HEMOGLOBIN A1C
Est. average glucose Bld gHb Est-mCnc: 160 mg/dL
Hgb A1c MFr Bld: 7.2 % — ABNORMAL HIGH (ref 4.8–5.6)

## 2017-07-18 MED ORDER — FENOFIBRATE 54 MG PO TABS: ORAL_TABLET | ORAL | 4 refills | Status: DC

## 2017-07-18 MED ORDER — METFORMIN HCL ER 500 MG PO TB24: ORAL_TABLET | ORAL | 1 refills | Status: DC

## 2017-07-18 MED ORDER — LISINOPRIL 10 MG PO TABS: 10.0000 mg | ORAL_TABLET | Freq: Every day | ORAL | 1 refills | Status: DC

## 2017-07-18 MED ORDER — ATORVASTATIN CALCIUM 80 MG PO TABS: 80.0000 mg | ORAL_TABLET | Freq: Every day | ORAL | 3 refills | Status: DC

## 2017-07-18 MED ORDER — METOPROLOL TARTRATE 100 MG PO TABS: 100.0000 mg | ORAL_TABLET | Freq: Two times a day (BID) | ORAL | 1 refills | Status: DC

## 2017-07-18 MED ORDER — HYDROCHLOROTHIAZIDE 12.5 MG PO CAPS: 12.5000 mg | ORAL_CAPSULE | Freq: Every day | ORAL | 1 refills | Status: DC

## 2017-07-18 NOTE — Progress Notes (Signed)
Seth Pope  MRN: [id number] DOB: 07/29/1950  PCP: Mancel Bale, PA-C  Chief Complaint  Patient presents with  . Medication Refill    all meds   . Hand Pain    both thumbs hurt     Subjective:  Pt presents to clinic for diabetes check, recheck postherpetic neuralgia, and bilateral thumb pain for the last several months.  Patient continues to take his diabetic medications without problems.  He does not check his blood sugar at home unless he has a problem.  Gabapentin has really helped his postherpetic neuralgia he rarely has pain.  He is not sure he is ready to decreased his gabapentin dose at this time.  He will likely contact me in 3 months to start the titration down to see if he still needs it.  Patient has for the last several months had bilateral thumb joint pain worse in the morning when he wakes up and as the day goes on does start to get better.  He has noticed some swelling.  He has noticed no erythema.  He has never had gout.  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  He does take 800 mg of ibuprofen with relief.  At some point he would like to discuss baby going off of some of his medications, and lifestyle modifications that he can do to be successful with this endeavor.  History is obtained by patient.  Review of Systems  Constitutional: Negative for chills and fever.  Eyes: Negative for visual disturbance.  Respiratory: Negative for cough and shortness of breath.   Cardiovascular: Negative for chest pain, palpitations and leg swelling.  Neurological: Negative for dizziness, light-headedness, numbness and headaches.    Patient Active Problem List   Diagnosis Date Noted  . Rib pain on right side 02/28/2017  . History of shingles 02/28/2017  . Post herpetic neuralgia 02/28/2017  . Flank pain 02/28/2017  . Anxiety 01/29/2016  . Bradycardia 11/13/2013  . Family history of coronary artery disease in father 10/23/2013  . Chest pain with moderate risk of  acute coronary syndrome 10/23/2013  . Shortness of breath 10/17/2013  . Tobacco abuse 10/17/2013  . Unstable angina ([place]) 10/04/2013  . PVC's (premature ventricular contractions) 10/04/2013  . Right groin pain 05/22/2012  . Neuropathy, diabetic ([place]) 01/31/2012  . Hypertension   . DM2 (diabetes mellitus, type 2) ([place])   . Hyperlipidemia     Current Outpatient Medications on File Prior to Visit  Medication Sig Dispense Refill  . ALPRAZolam (XANAX XR) 1 MG 24 hr tablet Take 1 mg by mouth daily.    Marland Kitchen ALPRAZolam (XANAX) 0.25 MG tablet TK ONE T PO PRN D  0  . aspirin 81 MG tablet Take 81 mg by mouth every other day.     . cyclobenzaprine (FLEXERIL) 5 MG tablet Take 1 or 2 pills 3 times daily when needed for muscle relaxant. 90 tablet 1  . desipramine (NORPRAMIN) 50 MG tablet Take 150 mg by mouth at bedtime.     . gabapentin (NEURONTIN) 300 MG capsule Take 2 capsules (600 mg total) by mouth 3 (three) times daily. 540 capsule 3  . HYDROcodone-acetaminophen (NORCO/VICODIN) 5-325 MG tablet Take 1 tablet by mouth every 6 (six) hours as needed for moderate pain. 20 tablet 0  . ibuprofen (ADVIL,MOTRIN) 800 MG tablet Take 1 tablet (800 mg total) by mouth every 8 (eight) hours as needed. 30 tablet 0  . ketoconazole (NIZORAL) 2 % shampoo Apply  1 application topically 2 (two) times a week.  2  . nitroGLYCERIN (NITROSTAT) 0.4 MG SL tablet Place one under the tongue as needed for chest pain. Repeat in 5 minutes if no effect. Call 911 if you need to take the medication 30 tablet 3  . ONETOUCH DELICA LANCETS FINE MISC USE AS DIRECTED TO CHECK BLOOD GLUCOSE UP TO THREE TIMES DAILY 100 each 0  . ONETOUCH VERIO test strip USE TO CHECK BLOOD SUGAR UP TO THREE TIMES DAILY. 100 each 0  . traMADol (ULTRAM) 50 MG tablet Take 1 tablet (50 mg total) by mouth every 6 (six) hours as needed. 20 tablet 0   No current facility-administered medications on file prior to visit.     Allergies  Allergen Reactions  .  Penicillins Rash    Has patient had a PCN reaction causing immediate rash, facial/tongue/throat swelling, SOB or lightheadedness with hypotension: Yes Has patient had a PCN reaction causing severe rash involving mucus membranes or skin necrosis: No Has patient had a PCN reaction that required hospitalization No Has patient had a PCN reaction occurring within the last 10 years: No If all of the above answers are "NO", then may proceed with Cephalosporin use.     Past Medical History:  Diagnosis Date  . Anxiety   . CAD (coronary artery disease)    a. cath 10/23/13: nonobstructive CAD, nl LV function, rec risk factor modification  . Chronic kidney disease    h/o kidney stone  . Diabetes mellitus    type 2  . History of pneumonia   . Hyperlipidemia   . Hypertension   . Sinus bradycardia    a. as low as 36 bpm; b. multiple pauses 2.1-2.3 seconds  . Ulcer   . Umbilical hernia    Social History   Social History Narrative   Lives at home w/ his wife   Right-handed   Daily caffeine   Social History   Tobacco Use  . Smoking status: Former Smoker    Packs/day: 2.50    Years: 18.00    Pack years: 45.00    Types: Cigarettes    Last attempt to quit: 07/16/1986    Years since quitting: 31.0  . Smokeless tobacco: Never Used  Substance Use Topics  . Alcohol use: Yes    Comment: social  . Drug use: Yes    Types: Marijuana    Comment: 2-3 times a week   family history includes Diabetes in his brother, paternal grandmother, and sister; Heart attack in his father; Heart disease in his father and paternal grandfather; Hypertension in his father and unknown relative.     Objective:  BP 104/60   Pulse (!) 54   Temp 97.8 F (36.6 C) (Oral)   Resp 18   Ht 5' 10.6" (1.793 m)   Wt 187 lb 9.6 oz (85.1 kg)   SpO2 98%   BMI 26.46 kg/m  Body mass index is 26.46 kg/m.  Physical Exam  Constitutional: He is oriented to person, place, and time.  HENT:  Head: Normocephalic and  atraumatic.  Right Ear: External ear normal.  Left Ear: External ear normal.  Eyes: Conjunctivae are normal.  Neck: Normal range of motion.  Cardiovascular: Normal rate, regular rhythm and normal heart sounds.  No murmur heard. Pulmonary/Chest: Effort normal and breath sounds normal. He has no wheezes.  Musculoskeletal:  Bilateral thumbs tender to palpation first MCP mild swelling, no erythema.  Negative Finkelstein's.  No tenderness over extensor tendon.  Increased pain with thumb mobility and resistance.  Neurological: He is alert and oriented to person, place, and time.  Skin: Skin is warm and dry.  Psychiatric: Judgment normal.   Dg Finger Thumb Left  Result Date: 07/18/2017 CLINICAL DATA:  67 year old male with bilateral thumb pain worse in the mornings. Mild swelling, no erythema. Suspect osteoarthritis. EXAM: LEFT THUMB 2+V COMPARISON:  None. FINDINGS: Bone mineralization is within normal limits for age. Joint space loss at the 1st [place], basal joint of the thumb with subchondral sclerosis and osteophytosis. Similar osteophytosis with less joint space loss at the thumb MCP and IP joints. Somewhat bulky and discontinuous osteophytes along the dorsal thumb IP joint. No acute osseous abnormality identified. IMPRESSION: Moderate osteoarthritis throughout the left hand 1st [place]. No acute osseous abnormality. Electronically Signed   By: Genevie Ann M.D.   On: 07/18/2017 11:08   Dg Finger Thumb Right  Result Date: 07/18/2017 CLINICAL DATA:  67 year old male with bilateral thumb pain worse in the mornings. Mild swelling, no erythema. Suspect osteoarthritis. EXAM: RIGHT THUMB 2+V COMPARISON:  Contralateral left thumb series today reported separately. FINDINGS: Mild right 1st CMC, basal thumb joint loss and subchondral sclerosis. The right thumb MCP joint appears within normal limits for age. Moderate joint space loss and osteophytosis at the right thumb IP joint. No acute osseous abnormality identified.  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 No acute osseous abnormality. Electronically Signed   By: Genevie Ann M.D.   On: 07/18/2017 11:09    Assessment and Plan :  Bilateral thumb pain - Plan: DG Finger Thumb Left, DG Finger Thumb Right -arthritis in the thumb but not as much as I suspected in the CP joint.  He will use ibuprofen 800 mg 3 times a day for the next 2 weeks and then stop to see if his overall pain is better, as expect he will be a good candidate for an injection in his thumb at some point.  PHN (postherpetic neuralgia) patient has gabapentin at home which he will continue at 600 mg 3 times daily dosing within the next 3 months he would like to titrate down and he will contact me through my chart when he is ready to do that. -   Pure hypercholesterolemia - Plan: Lipid panel, CMP14+EGFR, fenofibrate 54 MG tablet, atorvastatin (LIPITOR) 80 MG tablet -check labs  Essential hypertension - Plan: CMP14+EGFR, metoprolol tartrate (LOPRESSOR) 100 MG tablet, lisinopril (PRINIVIL,ZESTRIL) 10 MG tablet, hydrochlorothiazide (MICROZIDE) 12.5 MG capsule -good control continue medications  Type 2 diabetes mellitus without complication, without long-term current use of insulin (HCC) - Plan: Hemoglobin A1c, CMP14+EGFR, metFORMIN (GLUCOPHAGE-XR) 500 MG 24 hr tablet -patient has been in good control in the past suspect this to be the same continue current medications once labs return may change based on those results  Windell Hummingbird PA-C  [facility] at [facility] 07/18/2017 12:26 PM

## 2017-07-18 NOTE — Patient Instructions (Signed)
     IF you received an x-[place] today, you will receive an invoice from [facility]. Please contact [facility] at [phone] with questions or concerns regarding your invoice.   IF you received labwork today, you will receive an invoice from [facility]. Please contact [facility] at [phone] with questions or concerns regarding your invoice.   Our billing staff will not be able to assist you with questions regarding bills from these companies.  You will be contacted with the lab results as soon as they are available. The fastest way to get your results is to activate your My Chart account. Instructions are located on the last page of this paperwork. If you have not heard from us regarding the results in 2 weeks, please contact this office.     

## 2017-10-16 ENCOUNTER — Encounter: Payer: Self-pay | Admitting: Physician Assistant

## 2017-10-18 NOTE — Telephone Encounter (Signed)
Seth Pope - Another one that I did not need to get.  Scheduling - please call and make this patient an appt to see me

## 2017-10-25 ENCOUNTER — Other Ambulatory Visit: Payer: Self-pay

## 2017-10-25 ENCOUNTER — Ambulatory Visit: Payer: 59 | Admitting: Physician Assistant

## 2017-10-25 ENCOUNTER — Encounter: Payer: Self-pay | Admitting: Physician Assistant

## 2017-10-25 VITALS — BP 110/70 | HR 68 | Temp 99.1°F | Resp 18 | Ht 70.6 in | Wt 188.4 lb

## 2017-10-25 DIAGNOSIS — I10 Essential (primary) hypertension: Secondary | ICD-10-CM

## 2017-10-25 DIAGNOSIS — Z23 Encounter for immunization: Secondary | ICD-10-CM | POA: Diagnosis not present

## 2017-10-25 DIAGNOSIS — B0229 Other postherpetic nervous system involvement: Secondary | ICD-10-CM

## 2017-10-25 DIAGNOSIS — E7800 Pure hypercholesterolemia, unspecified: Secondary | ICD-10-CM

## 2017-10-25 DIAGNOSIS — E119 Type 2 diabetes mellitus without complications: Secondary | ICD-10-CM | POA: Diagnosis not present

## 2017-10-25 DIAGNOSIS — R1011 Right upper quadrant pain: Secondary | ICD-10-CM

## 2017-10-25 DIAGNOSIS — E1142 Type 2 diabetes mellitus with diabetic polyneuropathy: Secondary | ICD-10-CM

## 2017-10-25 NOTE — Patient Instructions (Addendum)
To taper down of gabapentin - decrease a pill in the am for 1-2 week - if you are still good you decrease a pill at night for 1-2 weeks  [place], [place], [place] [address] Phone: [phone]  We are going to order an ultrasound for the belly pain.  If you have lab work done today you will be contacted with your lab results within the next 2 weeks.  If you have not heard from [place] then please contact us. The fastest way to get your results is to register for My Chart.   IF you received an x-[place] today, you will receive an invoice from [facility]. Please contact [facility] at [phone] with questions or concerns regarding your invoice.   IF you received labwork today, you will receive an invoice from [facility]. Please contact [facility] at [phone] with questions or concerns regarding your invoice.   Our billing staff will not be able to assist you with questions regarding bills from these companies.  You will be contacted with the lab results as soon as they are available. The fastest way to get your results is to activate your My Chart account. Instructions are located on the last page of this paperwork. If you have not heard from [place] regarding the results in 2 weeks, please contact this office.

## 2017-10-25 NOTE — Progress Notes (Signed)
Seth Pope  MRN: [id number] DOB: [birth year]  PCP: Leonie Douglas, PA-C  Chief Complaint  Patient presents with  . Medication Refill    med check and blood work     Subjective:  Pt presents to clinic for  1-Post-herpatic neuralgia - gabapentin 2 in the am and 2 at night 2- RUQ pain - activity related - esp when he couches down - this in the same location as his post-hepatic neuralgia but the shingles pain has almost resolved but this pain has continued - it feels like a pulled or torn muscle.  No change in his bowel movements - sharp burning pain - this pain has not changed with the neurontin increase or decrease. 3-also time for 15-monthrecheck of hypertension and diabetes.  History is obtained by patient.  Review of Systems  Constitutional: Negative for chills and fever.  Eyes: Negative for visual disturbance.  Respiratory: Negative for cough and shortness of breath.   Cardiovascular: Negative for chest pain, palpitations and leg swelling.  Gastrointestinal: Positive for abdominal pain (Right upper quadrant). Negative for constipation, diarrhea and nausea.  Neurological: Negative for dizziness, light-headedness, numbness and headaches.    Patient Active Problem List   Diagnosis Date Noted  . Rib pain on right side 02/28/2017  . History of shingles 02/28/2017  . Post herpetic neuralgia 02/28/2017  . Flank pain 02/28/2017  . Anxiety 01/29/2016  . Bradycardia 11/13/2013  . Family history of coronary artery disease in father 10/23/2013  . Chest pain with moderate risk of acute coronary syndrome 10/23/2013  . Shortness of breath 10/17/2013  . Tobacco abuse 10/17/2013  . [place]stable [place] ([facility] [place] 10/04/2013  . PVC's (premature ventricular contractions) 10/04/2013  . Right groin pain 05/22/2012  . Neuropathy, diabetic ([facility] 01/31/2012  . Hypertension   . DM2 (diabetes mellitus, type 2) ([facility]   . Hyperlipidemia     Current Outpatient Medications on File Prior to Visit    Medication Sig Dispense Refill  . ALPRAZolam (XANAX XR) 1 MG 24 hr tablet Take 1 mg by mouth daily.    .Marland KitchenALPRAZolam (XANAX) 0.25 MG tablet TK ONE T PO PRN D  0  . aspirin 81 MG tablet Take 81 mg by mouth every other day.     .Marland Kitchenatorvastatin (LIPITOR) 80 MG tablet Take 1 tablet (80 mg total) by mouth daily. 90 tablet 3  . cyclobenzaprine (FLEXERIL) 5 MG tablet Take 1 or 2 pills 3 times daily when needed for muscle relaxant. 90 tablet 1  . desipramine (NORPRAMIN) 50 MG tablet Take 150 mg by mouth at bedtime.     . fenofibrate 54 MG tablet TAKE 1 TABLET BY MOUTH DAILY WITH FOOD 90 tablet 4  . gabapentin (NEURONTIN) 300 MG capsule Take 2 capsules (600 mg total) by mouth 3 (three) times daily. 540 capsule 3  . hydrochlorothiazide (MICROZIDE) 12.5 MG capsule Take 1 capsule (12.5 mg total) by mouth daily. 90 capsule 1  . HYDROcodone-acetaminophen (NORCO/VICODIN) 5-325 MG tablet Take 1 tablet by mouth every 6 (six) hours as needed for moderate pain. 20 tablet 0  . ibuprofen (ADVIL,MOTRIN) 800 MG tablet Take 1 tablet (800 mg total) by mouth every 8 (eight) hours as needed. 30 tablet 0  . ketoconazole (NIZORAL) 2 % shampoo Apply 1 application topically 2 (two) times a week.  2  . lisinopril (PRINIVIL,ZESTRIL) 10 MG tablet Take 1 tablet (10 mg total) by mouth daily. 90 tablet 1  . metFORMIN (GLUCOPHAGE-XR) 500 MG 24 hr tablet  Take 2 tablets by mouth in the morning and 1 tablet by mouth in the evening.   Fill at patient's request 270 tablet 1  . metoprolol tartrate (LOPRESSOR) 100 MG tablet Take 1 tablet (100 mg total) by mouth 2 (two) times daily. TAKE 1 TABLET(100 MG) BY MOUTH TWICE DAILY 180 tablet 1  . nitroGLYCERIN (NITROSTAT) 0.4 MG SL tablet Place one under the tongue as needed for chest pain. Repeat in 5 minutes if no effect. Call 911 if you need to take the medication 30 tablet 3  . ONETOUCH DELICA LANCETS FINE MISC USE AS DIRECTED TO CHECK BLOOD GLUCOSE UP TO THREE TIMES DAILY 100 each 0  . ONETOUCH  VERIO test strip USE TO CHECK BLOOD SUGAR UP TO THREE TIMES DAILY. 100 each 0  . traMADol (ULTRAM) 50 MG tablet Take 1 tablet (50 mg total) by mouth every 6 (six) hours as needed. 20 tablet 0   No current facility-administered medications on file prior to visit.     Allergies  Allergen Reactions  . Penicillins Rash    Has patient had a PCN reaction causing immediate rash, facial/tongue/throat swelling, SOB or lightheadedness with hypotension: Yes Has patient had a PCN reaction causing severe rash involving mucus membranes or skin necrosis: No Has patient had a PCN reaction that required hospitalization No Has patient had a PCN reaction occurring within the last 10 years: No If all of the above answers are "NO", then may proceed with Cephalosporin use.     Past Medical History:  Diagnosis Date  . Anxiety   . CAD (coronary artery disease)    a. cath 10/23/13: nonobstructive CAD, nl LV function, rec risk factor modification  . Chronic kidney disease    h/o kidney stone  . Diabetes mellitus    type 2  . History of pneumonia   . Hyperlipidemia   . Hypertension   . Sinus bradycardia    a. as low as 36 bpm; b. multiple pauses 2.1-2.3 seconds  . Ulcer   . Umbilical hernia    Social History   Social History Narrative   Lives at home w/ his wife   Right-handed   Daily caffeine   Social History   Tobacco Use  . Smoking status: Former Smoker    Packs/day: 2.50    Years: 18.00    Pack years: 45.00    Types: Cigarettes    Last attempt to quit: 07/16/1986    Years since quitting: 31.3  . Smokeless tobacco: Never Used  Substance Use Topics  . Alcohol use: Yes    Comment: social  . Drug use: Yes    Types: Marijuana    Comment: 2-3 times a week   family history includes Diabetes in his brother, paternal grandmother, and sister; Heart attack in his father; Heart disease in his father and paternal grandfather; Hypertension in his father and unknown relative.     Objective:  BP  110/70   Pulse 68   Temp 99.1 F (37.3 C) (Oral)   Resp 18   Ht 5' 10.6" (1.793 m)   Wt 188 lb 6.4 oz (85.5 kg)   SpO2 96%   BMI 26.58 kg/m  Body mass index is 26.58 kg/m.  Wt Readings from Last 3 Encounters:  10/25/17 188 lb 6.4 oz (85.5 kg)  07/18/17 187 lb 9.6 oz (85.1 kg)  05/02/17 190 lb 12.8 oz (86.5 kg)    Physical Exam  Constitutional: He is oriented to person, place, and time. He  appears well-developed and well-nourished.  HENT:  Head: Normocephalic and atraumatic.  Right Ear: External ear normal.  Left Ear: External ear normal.  Eyes: Conjunctivae are normal.  Neck: Normal range of motion.  Cardiovascular: Normal rate, regular rhythm, normal heart sounds and intact distal pulses.  Pulmonary/Chest: Effort normal and breath sounds normal. He has no wheezes.  Abdominal: Soft. Bowel sounds are normal. There is tenderness (Right upper quadrant). There is no guarding.  Musculoskeletal:       Right lower leg: He exhibits no edema.       Left lower leg: He exhibits no edema.  Neurological: He is alert and oriented to person, place, and time.  Skin: Skin is warm and dry.  Psychiatric: He has a normal mood and affect. His behavior is normal. Judgment and thought content normal.  Vitals reviewed.   Assessment and Plan :  Pure hypercholesterolemia -check labs adjust medications if needed  Flu vaccine need - Plan: Flu Vaccine QUAD 36+ mos IM  PHN (postherpetic neuralgia) -okay to decrease dose down until his diabetic neuropathy pain returns and then he should stay at this dose of gabapentin.  Unsure if it would be the same as what it was prior to treating postherpetic neuralgia  Essential hypertension -well-controlled continue current medications  Type 2 diabetes mellitus without complication, without long-term current use of insulin ([place]) - Plan: Hemoglobin A1c, CMP14+EGFR -check labs adjust dose if needed  Diabetic polyneuropathy associated with type 2 diabetes  mellitus ([place])  Need for vaccination with 13-polyvalent pneumococcal conjugate vaccine - Plan: Pneumococcal conjugate vaccine 13-valent IM  RUQ pain - Plan: US Abdomen Limited RUQ -patient is tender right along his gallbladder did do liver panel today and will check ultrasound as he has had this pain for a while and now can differentiate it being different than the postherpetic neuralgia to the best of his ability.  He is quite tender on exam so this is possible.  Patient verbalized to me that they understand the following: diagnosis, what is being done for them, what to expect and what should be done at home.  Their questions have been answered.  See after visit summary for patient specific instructions.  Windell Hummingbird PA-C  [facility] 10/31/2017 10:13 PM  Please note: Portions of this report may have been transcribed using dragon voice recognition software. Every effort was made to ensure accuracy; however, inadvertent computerized transcription errors may be present.

## 2017-10-26 LAB — HEMOGLOBIN A1C
Est. average glucose Bld gHb Est-mCnc: 160 mg/dL
Hgb A1c MFr Bld: 7.2 % — ABNORMAL HIGH (ref 4.8–5.6)

## 2017-10-26 LAB — CMP14+EGFR
ALT: 17 IU/L (ref 0–44)
AST: 18 IU/L (ref 0–40)
Albumin/Globulin Ratio: 1.8 (ref 1.2–2.2)
Albumin: 4.4 g/dL (ref 3.6–4.8)
Alkaline Phosphatase: 110 IU/L (ref 39–117)
BUN/Creatinine Ratio: 15 (ref 10–24)
BUN: 17 mg/dL (ref 8–27)
Bilirubin Total: 0.2 mg/dL (ref 0.0–1.2)
CO2: 20 mmol/L (ref 20–29)
Calcium: 9.7 mg/dL (ref 8.6–10.2)
Chloride: 103 mmol/L (ref 96–106)
Creatinine, Ser: 1.15 mg/dL (ref 0.76–1.27)
GFR calc Af Amer: 77 mL/min/{1.73_m2} (ref >59)
GFR calc non Af Amer: 66 mL/min/{1.73_m2} (ref >59)
Globulin, Total: 2.4 g/dL (ref 1.5–4.5)
Glucose: 149 mg/dL — ABNORMAL HIGH (ref 65–99)
Potassium: 4.7 mmol/L (ref 3.5–5.2)
Sodium: 140 mmol/L (ref 134–144)
Total Protein: 6.8 g/dL (ref 6.0–8.5)

## 2017-10-31 ENCOUNTER — Ambulatory Visit
Admission: RE | Admit: 2017-10-31 | Discharge: 2017-10-31 | Disposition: A | Discharge status: Home / Self Care | Payer: 59 | Source: Ambulatory Visit | Attending: Physician Assistant | Admitting: Physician Assistant

## 2017-10-31 ENCOUNTER — Telehealth: Payer: Self-pay | Admitting: Physician Assistant

## 2017-10-31 DIAGNOSIS — R1011 Right upper quadrant pain: Secondary | ICD-10-CM

## 2017-10-31 NOTE — Telephone Encounter (Signed)
Pt came in to assign [place] as primary care provider.He was [facility] patient and has some results from ultrasound that need to be sent and didn't want [place] to miss them. FR

## 2018-01-12 ENCOUNTER — Other Ambulatory Visit: Payer: Self-pay

## 2018-01-12 DIAGNOSIS — I10 Essential (primary) hypertension: Secondary | ICD-10-CM

## 2018-01-12 MED ORDER — HYDROCHLOROTHIAZIDE 12.5 MG PO CAPS: 12.5000 mg | ORAL_CAPSULE | Freq: Every day | ORAL | 0 refills | Status: DC

## 2018-01-17 ENCOUNTER — Telehealth: Payer: Self-pay | Admitting: Emergency Medicine

## 2018-01-17 NOTE — Telephone Encounter (Signed)
LVM for pt in regards to their appt on 01/24/18 with Dr. Mitchel Honour. Due to Dr. Truitt Leep schedule (staff retreat - office closing early) change, he will be unavailable that day at that time. When pt calls back, please reschedule with Sagardia at their convenience. Thank you!

## 2018-01-19 ENCOUNTER — Other Ambulatory Visit: Payer: Self-pay | Admitting: Physician Assistant

## 2018-01-19 DIAGNOSIS — I10 Essential (primary) hypertension: Secondary | ICD-10-CM

## 2018-01-19 MED ORDER — LISINOPRIL 10 MG PO TABS: 10.0000 mg | ORAL_TABLET | Freq: Every day | ORAL | 0 refills | Status: DC

## 2018-01-19 NOTE — Telephone Encounter (Signed)
Copied from [place] [phone] [phone]. Topic: Quick Communication - Rx Refill/Question >> Jan 19, 2018 11:47 AM Antonieta Iba C wrote: Medication: lisinopril (PRINIVIL,ZESTRIL) 10 MG tablet  - pt's apt was rescheduled with provider due to the office closing early. Pt would like to know if he could receive a refill atleast until his new apt?   Has the patient contacted their pharmacy? No  (Agent: If no, request that the patient contact the pharmacy for the refill.) (Agent: If yes, when and what did the pharmacy advise?)  Preferred Pharmacy (with phone number or street name): [facility], [place] - [place] [phone] (Phone) [phone] (Fax)    Agent: Please be advised that RX refills may take up to 3 business days. We ask that you follow-up with your pharmacy.

## 2018-01-19 NOTE — Telephone Encounter (Signed)
Requested Prescriptions  Pending Prescriptions Disp Refills  . lisinopril (PRINIVIL,ZESTRIL) 10 MG tablet 90 tablet 0    Sig: Take 1 tablet (10 mg total) by mouth daily.     Cardiovascular:  ACE Inhibitors Passed - 01/19/2018  2:50 PM      Passed - Cr in normal range and within 180 days    Creat  Date Value Ref Range Status  08/13/2015 1.09 0.70 - 1.25 mg/dL Final    Comment:      For patients > or = 65 years of age: The upper reference limit for Creatinine is approximately 13% higher for people identified as African-American.      Creatinine, Ser  Date Value Ref Range Status  10/25/2017 1.15 0.76 - 1.27 mg/dL Final         Passed - K in normal range and within 180 days    Potassium  Date Value Ref Range Status  10/25/2017 4.7 3.5 - 5.2 mmol/L Final         Passed - Patient is not pregnant      Passed - Last BP in normal range    BP Readings from Last 1 Encounters:  10/25/17 110/70         Passed - Valid encounter within last 6 months    Recent Outpatient Visits          2 months ago Pure hypercholesterolemia   [facility], Austin Pongratz L, PA-C   6 months ago Bilateral thumb pain   [facility], PA-C   8 months ago Diabetic polyneuropathy associated with type 2 diabetes mellitus [facility])   [facility], PA-C   10 months ago Acute midline low back pain without sciatica   [facility], PA-C   10 months ago Rib pain on right side   [facility], Ines Bloomer, MD      Future Appointments            In 1 month Sagardia, Ines Bloomer, MD [facility] [place], [facility]

## 2018-01-24 ENCOUNTER — Ambulatory Visit: Payer: 59 | Admitting: Emergency Medicine

## 2018-01-29 ENCOUNTER — Other Ambulatory Visit: Payer: Self-pay

## 2018-01-29 ENCOUNTER — Telehealth: Payer: Self-pay | Admitting: Emergency Medicine

## 2018-01-29 DIAGNOSIS — I10 Essential (primary) hypertension: Secondary | ICD-10-CM

## 2018-01-29 DIAGNOSIS — E119 Type 2 diabetes mellitus without complications: Secondary | ICD-10-CM

## 2018-01-29 MED ORDER — METOPROLOL TARTRATE 100 MG PO TABS: 100.0000 mg | ORAL_TABLET | Freq: Two times a day (BID) | ORAL | 0 refills | Status: DC

## 2018-01-29 MED ORDER — METFORMIN HCL ER 500 MG PO TB24: ORAL_TABLET | ORAL | 0 refills | Status: DC

## 2018-01-29 NOTE — Telephone Encounter (Signed)
Pt needs refills for Metformin  500mg  and metoprolo 100 mg refills called in pt is aware he needs lab work the patient has appt 02/22/2017 / please call him and let him know [phone]

## 2018-01-29 NOTE — Telephone Encounter (Signed)
Pt arrived to office without appt requesting refill of Metoprolol and Metformin.  Was given six months of medication in June and had appt in December but it was cancelled by clinic due to scheduling change.  Pt needs refill before his Jan appt.  Provided one month of Metformin and Metoprolol (no refills) to carry pt through to appt d/t prev appt being cancelled by office.

## 2018-02-12 ENCOUNTER — Other Ambulatory Visit: Payer: Self-pay | Admitting: Emergency Medicine

## 2018-02-12 DIAGNOSIS — I10 Essential (primary) hypertension: Secondary | ICD-10-CM

## 2018-02-22 ENCOUNTER — Encounter: Payer: Self-pay | Admitting: Emergency Medicine

## 2018-02-22 ENCOUNTER — Other Ambulatory Visit: Payer: Self-pay

## 2018-02-22 ENCOUNTER — Ambulatory Visit: Payer: 59 | Admitting: Emergency Medicine

## 2018-02-22 VITALS — BP 130/65 | HR 63 | Temp 98.5°F | Resp 16 | Ht 69.0 in | Wt 185.0 lb

## 2018-02-22 DIAGNOSIS — B0229 Other postherpetic nervous system involvement: Secondary | ICD-10-CM

## 2018-02-22 DIAGNOSIS — E119 Type 2 diabetes mellitus without complications: Secondary | ICD-10-CM | POA: Diagnosis not present

## 2018-02-22 DIAGNOSIS — E7800 Pure hypercholesterolemia, unspecified: Secondary | ICD-10-CM | POA: Diagnosis not present

## 2018-02-22 DIAGNOSIS — I10 Essential (primary) hypertension: Secondary | ICD-10-CM

## 2018-02-22 LAB — POCT GLYCOSYLATED HEMOGLOBIN (HGB A1C): Hemoglobin A1C: 7.3 % — AB (ref 4.0–5.6)

## 2018-02-22 MED ORDER — LISINOPRIL 10 MG PO TABS: 10.0000 mg | ORAL_TABLET | Freq: Every day | ORAL | 1 refills | Status: DC

## 2018-02-22 MED ORDER — HYDROCHLOROTHIAZIDE 12.5 MG PO CAPS: ORAL_CAPSULE | ORAL | 1 refills | Status: DC

## 2018-02-22 MED ORDER — METOPROLOL TARTRATE 100 MG PO TABS: 100.0000 mg | ORAL_TABLET | Freq: Two times a day (BID) | ORAL | 1 refills | Status: DC

## 2018-02-22 MED ORDER — ATORVASTATIN CALCIUM 80 MG PO TABS: 80.0000 mg | ORAL_TABLET | Freq: Every day | ORAL | 3 refills | Status: DC

## 2018-02-22 MED ORDER — METFORMIN HCL ER 500 MG PO TB24: ORAL_TABLET | ORAL | 1 refills | Status: DC

## 2018-02-22 MED ORDER — GABAPENTIN 300 MG PO CAPS: 600.0000 mg | ORAL_CAPSULE | Freq: Two times a day (BID) | ORAL | 1 refills | Status: DC

## 2018-02-22 NOTE — Assessment & Plan Note (Signed)
Hemoglobin A1c 7.3 today.  We will continue present medications and follow-up in 6 months.  If hemoglobin A1c still above 7 then will add a second medication.  Stressed importance of diet and exercise.

## 2018-02-22 NOTE — Assessment & Plan Note (Signed)
Well-controlled.  Continue present medications.  Follow-up in 6 months. 

## 2018-02-22 NOTE — Patient Instructions (Addendum)
   If you have lab work done today you will be contacted with your lab results within the next 2 weeks.  If you have not heard from us then please contact us. The fastest way to get your results is to register for My Chart.   IF you received an x-[facility] today, you will receive an invoice from [facility]. Please contact  Radiology at [phone] with questions or concerns regarding your invoice.   IF you received labwork today, you will receive an invoice from [facility]. Please contact [facility] at [phone] with questions or concerns regarding your invoice.   Our billing staff will not be able to assist you with questions regarding bills from these companies.  You will be contacted with the lab results as soon as they are available. The fastest way to get your results is to activate your My Chart account. Instructions are located on the last page of this paperwork. If you have not heard from us regarding the results in 2 weeks, please contact this office.     Diabetes Mellitus and Nutrition, Adult When you have diabetes (diabetes mellitus), it is very important to have healthy eating habits because your blood sugar (glucose) levels are greatly affected by what you eat and drink. Eating healthy foods in the appropriate amounts, at about the same times every day, can help you:  Control your blood glucose.  Lower your risk of heart disease.  Improve your blood pressure.  Reach or maintain a healthy weight. Every person with diabetes is different, and each person has different needs for a meal plan. Your health care provider may recommend that you work with a diet and nutrition specialist (dietitian) to make a meal plan that is best for you. Your meal plan may vary depending on factors such as:  The calories you need.  The medicines you take.  Your weight.  Your blood glucose, blood pressure, and cholesterol levels.  Your activity level.  Other health conditions  you have, such as heart or kidney disease. How do carbohydrates affect me? Carbohydrates, also called carbs, affect your blood glucose level more than any other type of food. Eating carbs naturally raises the amount of glucose in your blood. Carb counting is a method for keeping track of how many carbs you eat. Counting carbs is important to keep your blood glucose at a healthy level, especially if you use insulin or take certain oral diabetes medicines. It is important to know how many carbs you can safely have in each meal. This is different for every person. Your dietitian can help you calculate how many carbs you should have at each meal and for each snack. Foods that contain carbs include:  Bread, cereal, rice, pasta, and crackers.  Potatoes and corn.  Peas, beans, and lentils.  Milk and yogurt.  Fruit and juice.  Desserts, such as cakes, cookies, ice cream, and candy. How does alcohol affect me? Alcohol can cause a sudden decrease in blood glucose (hypoglycemia), especially if you use insulin or take certain oral diabetes medicines. Hypoglycemia can be a life-threatening condition. Symptoms of hypoglycemia (sleepiness, dizziness, and confusion) are similar to symptoms of having too much alcohol. If your health care provider says that alcohol is safe for you, follow these guidelines:  Limit alcohol intake to no more than 1 drink per day for nonpregnant women and 2 drinks per day for men. One drink equals 12 oz of beer, 5 oz of wine, or 1 oz of hard liquor.    Do not drink on an empty stomach.  Keep yourself hydrated with water, diet soda, or unsweetened iced tea.  Keep in mind that regular soda, juice, and other mixers may contain a lot of sugar and must be counted as carbs. What are tips for following this plan?  Reading food labels  Start by checking the serving size on the "Nutrition Facts" label of packaged foods and drinks. The amount of calories, carbs, fats, and other  nutrients listed on the label is based on one serving of the item. Many items contain more than one serving per package.  Check the total grams (g) of carbs in one serving. You can calculate the number of servings of carbs in one serving by dividing the total carbs by 15. For example, if a food has 30 g of total carbs, it would be equal to 2 servings of carbs.  Check the number of grams (g) of saturated and trans fats in one serving. Choose foods that have low or no amount of these fats.  Check the number of milligrams (mg) of salt (sodium) in one serving. Most people should limit total sodium intake to less than 2,300 mg per day.  Always check the nutrition information of foods labeled as "low-fat" or "nonfat". These foods may be higher in added sugar or refined carbs and should be avoided.  Talk to your dietitian to identify your daily goals for nutrients listed on the label. Shopping  Avoid buying canned, premade, or processed foods. These foods tend to be high in fat, sodium, and added sugar.  Shop around the outside edge of the grocery store. This includes fresh fruits and vegetables, bulk grains, fresh meats, and fresh dairy. Cooking  Use low-heat cooking methods, such as baking, instead of high-heat cooking methods like deep frying.  Cook using healthy oils, such as olive, canola, or sunflower oil.  Avoid cooking with butter, cream, or high-fat meats. Meal planning  Eat meals and snacks regularly, preferably at the same times every day. Avoid going long periods of time without eating.  Eat foods high in fiber, such as fresh fruits, vegetables, beans, and whole grains. Talk to your dietitian about how many servings of carbs you can eat at each meal.  Eat 4-6 ounces (oz) of lean protein each day, such as lean meat, chicken, fish, eggs, or tofu. One oz of lean protein is equal to: ? 1 oz of meat, chicken, or fish. ? 1 egg. ?  cup of tofu.  Eat some foods each day that contain  healthy fats, such as avocado, nuts, seeds, and fish. Lifestyle  Check your blood glucose regularly.  Exercise regularly as told by your health care provider. This may include: ? 150 minutes of moderate-intensity or vigorous-intensity exercise each week. This could be brisk walking, biking, or water aerobics. ? Stretching and doing strength exercises, such as yoga or weightlifting, at least 2 times a week.  Take medicines as told by your health care provider.  Do not use any products that contain nicotine or tobacco, such as cigarettes and e-cigarettes. If you need help quitting, ask your health care provider.  Work with a counselor or diabetes educator to identify strategies to manage stress and any emotional and social challenges. Questions to ask a health care provider  Do I need to meet with a diabetes educator?  Do I need to meet with a dietitian?  What number can I call if I have questions?  When are the best times to   check my blood glucose? Where to find more information:  American Diabetes Association: diabetes.org  Academy of Nutrition and Dietetics: www.eatright.org  National Institute of Diabetes and Digestive and Kidney Diseases (NIH): www.niddk.nih.gov Summary  A healthy meal plan will help you control your blood glucose and maintain a healthy lifestyle.  Working with a diet and nutrition specialist (dietitian) can help you make a meal plan that is best for you.  Keep in mind that carbohydrates (carbs) and alcohol have immediate effects on your blood glucose levels. It is important to count carbs and to use alcohol carefully. This information is not intended to replace advice given to you by your health care provider. Make sure you discuss any questions you have with your health care provider. Document Released: 10/28/2004 Document Revised: 08/31/2016 Document Reviewed: 03/07/2016 Elsevier Interactive Patient Education  2019 [employer]  

## 2018-02-22 NOTE — Progress Notes (Signed)
Lab Results  Component Value Date   HGBA1C 7.2 (H) 10/25/2017   BP Readings from Last 3 Encounters:  02/22/18 130/65  10/25/17 110/70  07/18/17 104/60   Wt Readings from Last 3 Encounters:  02/22/18 185 lb (83.9 kg)  10/25/17 188 lb 6.4 oz (85.5 kg)  07/18/17 187 lb 9.6 oz (85.1 kg)   Seth Pope 68 y.o.   Chief Complaint  Patient presents with  . Medication Refill    per patient ALL and do not refill diabetes supplies    HISTORY OF PRESENT ILLNESS: This is a 68 y.o. male with chronic medical problems here for follow-up and medication refill.  Used to be a patient of PA Weber. Has the following chronic abnormals: 1.  Diabetes type 2 with peripheral neuropathy.  On metformin twice a day.  Doing well. 2.  Hypertension.  Doing well.  Taking age CTZ, lisinopril and Lopressor. 3.  Dyslipidemia.  On Lipitor. Also sees a psychiatrist who has been prescribing Xanax and desipramine. Today he has no complaints or medical concerns.  HPI   Prior to Admission medications   Medication Sig Start Date End Date Taking? Authorizing Provider  ALPRAZolam (XANAX XR) 1 MG 24 hr tablet Take 1 mg by mouth daily.   Yes [provider]  ALPRAZolam (XANAX) 0.25 MG tablet TK ONE T PO PRN D 03/16/16  Yes [provider]  aspirin 81 MG tablet Take 81 mg by mouth every other day.    Yes [provider]  atorvastatin (LIPITOR) 80 MG tablet Take 1 tablet (80 mg total) by mouth daily. 07/18/17  Yes Weber, Sarah L, PA-C  cyclobenzaprine (FLEXERIL) 5 MG tablet Take 1 or 2 pills 3 times daily when needed for muscle relaxant. 03/08/17  Yes Weber, Sarah L, PA-C  desipramine (NORPRAMIN) 50 MG tablet Take 150 mg by mouth at bedtime.    Yes [provider]  fenofibrate 54 MG tablet TAKE 1 TABLET BY MOUTH DAILY WITH FOOD 07/18/17  Yes Weber, Sarah L, PA-C  gabapentin (NEURONTIN) 300 MG capsule Take 2 capsules (600 mg total) by mouth 3 (three) times daily. 07/04/17  Yes Weber, Sarah L,  PA-C  hydrochlorothiazide (MICROZIDE) 12.5 MG capsule TAKE 1 CAPSULE(12.5 MG) BY MOUTH DAILY 02/12/18  Yes Haytham Maher, Ines Bloomer, MD  HYDROcodone-acetaminophen (NORCO/VICODIN) 5-325 MG tablet Take 1 tablet by mouth every 6 (six) hours as needed for moderate pain. 03/08/17  Yes Weber, Sarah L, PA-C  ibuprofen (ADVIL,MOTRIN) 800 MG tablet Take 1 tablet (800 mg total) by mouth every 8 (eight) hours as needed. 03/08/17  Yes Weber, Sarah L, PA-C  ketoconazole (NIZORAL) 2 % shampoo Apply 1 application topically 2 (two) times a week. 09/09/15  Yes [provider]  lisinopril (PRINIVIL,ZESTRIL) 10 MG tablet Take 1 tablet (10 mg total) by mouth daily. 01/19/18  Yes Myrna Vonseggern, Ines Bloomer, MD  metFORMIN (GLUCOPHAGE-XR) 500 MG 24 hr tablet Take 2 tablets by mouth in the morning and 1 tablet by mouth in the evening.   Fill at patient's request 01/29/18  Yes Rutherford Guys, MD  metoprolol tartrate (LOPRESSOR) 100 MG tablet Take 1 tablet (100 mg total) by mouth 2 (two) times daily. TAKE 1 TABLET(100 MG) BY MOUTH TWICE DAILY 01/29/18  Yes Rutherford Guys, MD  nitroGLYCERIN (NITROSTAT) 0.4 MG SL tablet Place one under the tongue as needed for chest pain. Repeat in 5 minutes if no effect. Call 911 if you need to take the medication 10/05/13  Yes Arlyss Queen  A, MD  ONETOUCH DELICA LANCETS FINE MISC USE AS DIRECTED TO CHECK BLOOD GLUCOSE UP TO THREE TIMES DAILY 12/14/15  Yes Wardell Honour, MD  [facility] VERIO test strip USE TO CHECK BLOOD SUGAR UP TO THREE TIMES DAILY. 12/17/15  Yes Jeffery, Chelle, PA  traMADol (ULTRAM) 50 MG tablet Take 1 tablet (50 mg total) by mouth every 6 (six) hours as needed. 02/06/17  Yes Posey Boyer, MD    Allergies  Allergen Reactions  . Penicillins Rash    Has patient had a PCN reaction causing immediate rash, facial/tongue/throat swelling, SOB or lightheadedness with hypotension: Yes Has patient had a PCN reaction causing severe rash involving mucus membranes or skin necrosis:  No Has patient had a PCN reaction that required hospitalization No Has patient had a PCN reaction occurring within the last 10 years: No If all of the above answers are "NO", then may proceed with Cephalosporin use.     Patient Active Problem List   Diagnosis Date Noted  . Rib pain on right side 02/28/2017  . History of shingles 02/28/2017  . Post herpetic neuralgia 02/28/2017  . Flank pain 02/28/2017  . Anxiety 01/29/2016  . Bradycardia 11/13/2013  . Family history of coronary artery disease in father 10/23/2013  . Chest pain with moderate risk of acute coronary syndrome 10/23/2013  . Shortness of breath 10/17/2013  . Tobacco abuse 10/17/2013  . Unstable angina ([place]) 10/04/2013  . PVC's (premature ventricular contractions) 10/04/2013  . Right groin pain 05/22/2012  . Neuropathy, diabetic ([place]) 01/31/2012  . Hypertension   . DM2 (diabetes mellitus, type 2) ([place])   . Hyperlipidemia     Past Medical History:  Diagnosis Date  . Anxiety   . CAD (coronary artery disease)    a. cath 10/23/13: nonobstructive CAD, nl LV function, rec risk factor modification  . Chronic kidney disease    h/o kidney stone  . Diabetes mellitus    type 2  . History of pneumonia   . Hyperlipidemia   . Hypertension   . Sinus bradycardia    a. as low as 36 bpm; b. multiple pauses 2.1-2.3 seconds  . Ulcer   . Umbilical hernia     Past Surgical History:  Procedure Laterality Date  . BACK SURGERY    . COLONOSCOPY    . KNEE SURGERY Right 1970  . LEFT HEART CATHETERIZATION WITH CORONARY ANGIOGRAM N/A 10/23/2013   Procedure: LEFT HEART CATHETERIZATION WITH CORONARY ANGIOGRAM;  Surgeon: Peter M Martinique, MD;  Location: [facility] CATH LAB;  Service: Cardiovascular;  Laterality: N/A;  . MICROLARYNGOSCOPY N/A 09/23/2015   Procedure: MICROLARYNGOSCOPY REMOVAL OF LESION;  Surgeon: Izora Gala, MD;  Location: [facility];  Service: ENT;  Laterality: N/A;  . NM [place]  08/2010   bruce myoview - normal perfusion  in all regions, EF 66%, EKG negative for ischemia, no wall motion abnormalities, normal/low risk study                                       . ROTATOR CUFF REPAIR Bilateral 1991   x2  . UMBILICAL HERNIA REPAIR  1988    Social History   Socioeconomic History  . Marital status: Married    Spouse name: Abigail Butts  . Number of children: 2  . Years of education: 10th   . Highest education level: Not on file  Occupational History  .  Occupation: self-employed Research scientist (life sciences))  Social Needs  . Financial resource strain: Not on file  . Food insecurity:    Worry: Not on file    Inability: Not on file  . Transportation needs:    Medical: Not on file    Non-medical: Not on file  Tobacco Use  . Smoking status: Former Smoker    Packs/day: 2.50    Years: 18.00    Pack years: 45.00    Types: Cigarettes    Last attempt to quit: 07/16/1986    Years since quitting: 31.6  . Smokeless tobacco: Never Used  Substance and Sexual Activity  . Alcohol use: Yes    Comment: social  . Drug use: Yes    Types: Marijuana    Comment: 2-3 times a week  . Sexual activity: Yes  Lifestyle  . Physical activity:    Days per week: Not on file    Minutes per session: Not on file  . Stress: Not on file  Relationships  . Social connections:    Talks on phone: Not on file    Gets together: Not on file    Attends religious service: Not on file    Active member of club or organization: Not on file    Attends meetings of clubs or organizations: Not on file    Relationship status: Not on file  . Intimate partner violence:    Fear of current or ex partner: Not on file    Emotionally abused: Not on file    Physically abused: Not on file    Forced sexual activity: Not on file  Other Topics Concern  . Not on file  Social History Narrative   Lives at home w/ his wife   Right-handed   Daily caffeine    Family History  Problem Relation Age of Onset  . Heart attack Father   . Hypertension Father   . Heart  disease Father   . Diabetes Sister        HTN  . Diabetes Brother        HTN, heart problems  . Hypertension Unknown   . Diabetes Paternal Grandmother   . Heart disease Paternal Grandfather      Review of Systems  Constitutional: Negative.  Negative for chills, fever and weight loss.  HENT: Negative.  Negative for hearing loss, nosebleeds and sore throat.   Eyes: Negative.  Negative for blurred vision and double vision.  Respiratory: Negative.  Negative for cough and shortness of breath.   Cardiovascular: Negative.  Negative for chest pain and palpitations.  Gastrointestinal: Negative.  Negative for abdominal pain, diarrhea, nausea and vomiting.  Genitourinary: Negative.  Negative for dysuria and hematuria.  Musculoskeletal: Negative.  Negative for back pain, myalgias and neck pain.  Skin: Negative.  Negative for rash.  Neurological: Negative.  Negative for dizziness and headaches.  Endo/Heme/Allergies: Negative.   All other systems reviewed and are negative.   Vitals:   02/22/18 0810  BP: 130/65  Pulse: 63  Resp: 16  Temp: 98.5 F (36.9 C)  SpO2: 97%    Physical Exam Vitals signs reviewed.  Constitutional:      Appearance: Normal appearance.  HENT:     Head: Normocephalic and atraumatic.     Nose: Nose normal.     Mouth/Throat:     Mouth: Mucous membranes are moist.     Pharynx: Oropharynx is clear.  Eyes:     Extraocular Movements: Extraocular movements intact.     Conjunctiva/sclera:  Conjunctivae normal.     Pupils: Pupils are equal, round, and reactive to light.  Neck:     Musculoskeletal: Normal range of motion and neck supple.  Cardiovascular:     Rate and Rhythm: Normal rate and regular rhythm.     Heart sounds: Normal heart sounds.  Pulmonary:     Effort: Pulmonary effort is normal.     Breath sounds: Normal breath sounds.  Abdominal:     General: Abdomen is flat. There is no distension.     Palpations: Abdomen is soft.     Tenderness: There is no  abdominal tenderness.  Musculoskeletal: Normal range of motion.  Skin:    General: Skin is warm and dry.     Capillary Refill: Capillary refill takes less than 2 seconds.  Neurological:     General: No focal deficit present.     Mental Status: He is alert and oriented to person, place, and time.     Sensory: No sensory deficit.     Motor: No weakness.  Psychiatric:        Mood and Affect: Mood normal.        Behavior: Behavior normal.    Results for orders placed or performed in visit on 02/22/18 (from the past 24 hour(s))  POCT glycosylated hemoglobin (Hb A1C)     Status: Abnormal   Collection Time: 02/22/18  8:43 AM  Result Value Ref Range   Hemoglobin A1C 7.3 (A) 4.0 - 5.6 %   HbA1c POC (<> result, manual entry)     HbA1c, POC (prediabetic range)     HbA1c, POC (controlled diabetic range)      A total of 40 minutes was  spent in the room with the patient, greater than 50% of which was in counseling/coordination of care regarding chronic medical problems including diabetes and hypertension, treatment, medications, blood work results, and need for follow-up.  ASSESSMENT & PLAN: Hypertension Well-controlled.  Continue present medications.  Follow-up in 6 months.  DM2 (diabetes mellitus, type 2) Hemoglobin A1c 7.3 today.  We will continue present medications and follow-up in 6 months.  If hemoglobin A1c still above 7 then will add a second medication.  Stressed importance of diet and exercise.  Bralon was seen today for medication refill.  Diagnoses and all orders for this visit:  Type 2 diabetes mellitus without complication, without long-term current use of insulin (HCC) -     CMP14+EGFR -     POCT glycosylated hemoglobin (Hb A1C) -     HM DIABETES FOOT EXAM -     Ambulatory referral to Ophthalmology -     Lipid panel -     metFORMIN (GLUCOPHAGE-XR) 500 MG 24 hr tablet; Take 2 tablets by mouth in the morning and 1 tablet by mouth in the evening.   Fill at patient's  request -     gabapentin (NEURONTIN) 300 MG capsule; Take 2 capsules (600 mg total) by mouth 2 (two) times daily.  Essential hypertension -     hydrochlorothiazide (MICROZIDE) 12.5 MG capsule; TAKE 1 CAPSULE(12.5 MG) BY MOUTH DAILY -     lisinopril (PRINIVIL,ZESTRIL) 10 MG tablet; Take 1 tablet (10 mg total) by mouth daily. -     metoprolol tartrate (LOPRESSOR) 100 MG tablet; Take 1 tablet (100 mg total) by mouth 2 (two) times daily. TAKE 1 TABLET(100 MG) BY MOUTH TWICE DAILY  PHN (postherpetic neuralgia)  Pure hypercholesterolemia -     atorvastatin (LIPITOR) 80 MG tablet; Take 1 tablet (80  mg total) by mouth daily.    Patient Instructions       If you have lab work done today you will be contacted with your lab results within the next 2 weeks.  If you have not heard from [place] then please contact us. The fastest way to get your results is to register for My Chart.   IF you received an x-Ora today, you will receive an invoice from [facility]. Please contact [facility] at [phone] with questions or concerns regarding your invoice.   IF you received labwork today, you will receive an invoice from [facility]. Please contact [facility] at [phone] with questions or concerns regarding your invoice.   Our billing staff will not be able to assist you with questions regarding bills from these companies.  You will be contacted with the lab results as soon as they are available. The fastest way to get your results is to activate your My Chart account. Instructions are located on the last page of this paperwork. If you have not heard from [place] regarding the results in 2 weeks, please contact this office.     Diabetes Mellitus and Nutrition, Adult When you have diabetes (diabetes mellitus), it is very important to have healthy eating habits because your blood sugar (glucose) levels are greatly affected by what you eat and drink. Eating healthy foods in the appropriate  amounts, at about the same times every day, can help you:  Control your blood glucose.  Lower your risk of heart disease.  Improve your blood pressure.  Reach or maintain a healthy weight. Every person with diabetes is different, and each person has different needs for a meal plan. Your health care provider may recommend that you work with a diet and nutrition specialist (dietitian) to make a meal plan that is best for you. Your meal plan may vary depending on factors such as:  The calories you need.  The medicines you take.  Your weight.  Your blood glucose, blood pressure, and cholesterol levels.  Your activity level.  Other health conditions you have, such as heart or kidney disease. How do carbohydrates affect me? Carbohydrates, also called carbs, affect your blood glucose level more than any other type of food. Eating carbs naturally raises the amount of glucose in your blood. Carb counting is a method for keeping track of how many carbs you eat. Counting carbs is important to keep your blood glucose at a healthy level, especially if you use insulin or take certain oral diabetes medicines. It is important to know how many carbs you can safely have in each meal. This is different for every person. Your dietitian can help you calculate how many carbs you should have at each meal and for each snack. Foods that contain carbs include:  Bread, cereal, rice, pasta, and crackers.  Potatoes and corn.  Peas, beans, and lentils.  Milk and yogurt.  Fruit and juice.  Desserts, such as cakes, cookies, ice cream, and candy. How does alcohol affect me? Alcohol can cause a sudden decrease in blood glucose (hypoglycemia), especially if you use insulin or take certain oral diabetes medicines. Hypoglycemia can be a life-threatening condition. Symptoms of hypoglycemia (sleepiness, dizziness, and confusion) are similar to symptoms of having too much alcohol. If your health care provider says  that alcohol is safe for you, follow these guidelines:  Limit alcohol intake to no more than 1 drink per day for nonpregnant women and 2 drinks per day for men. One drink equals  12 oz of beer, 5 oz of wine, or 1 oz of hard liquor.  Do not drink on an empty stomach.  Keep yourself hydrated with water, diet soda, or unsweetened iced tea.  Keep in mind that regular soda, juice, and other mixers may contain a lot of sugar and must be counted as carbs. What are tips for following this plan?  Reading food labels  Start by checking the serving size on the "Nutrition Facts" label of packaged foods and drinks. The amount of calories, carbs, fats, and other nutrients listed on the label is based on one serving of the item. Many items contain more than one serving per package.  Check the total grams (g) of carbs in one serving. You can calculate the number of servings of carbs in one serving by dividing the total carbs by 15. For example, if a food has 30 g of total carbs, it would be equal to 2 servings of carbs.  Check the number of grams (g) of saturated and trans fats in one serving. Choose foods that have low or no amount of these fats.  Check the number of milligrams (mg) of salt (sodium) in one serving. Most people should limit total sodium intake to less than 2,300 mg per day.  Always check the nutrition information of foods labeled as "low-fat" or "nonfat". These foods may be higher in added sugar or refined carbs and should be avoided.  Talk to your dietitian to identify your daily goals for nutrients listed on the label. Shopping  Avoid buying canned, premade, or processed foods. These foods tend to be high in fat, sodium, and added sugar.  Shop around the outside edge of the grocery store. This includes fresh fruits and vegetables, bulk grains, fresh meats, and fresh dairy. Cooking  Use low-heat cooking methods, such as baking, instead of high-heat cooking methods like deep  frying.  Cook using healthy oils, such as olive, canola, or sunflower oil.  Avoid cooking with butter, cream, or high-fat meats. Meal planning  Eat meals and snacks regularly, preferably at the same times every day. Avoid going long periods of time without eating.  Eat foods high in fiber, such as fresh fruits, vegetables, beans, and whole grains. Talk to your dietitian about how many servings of carbs you can eat at each meal.  Eat 4-6 ounces (oz) of lean protein each day, such as lean meat, chicken, fish, eggs, or tofu. One oz of lean protein is equal to: ? 1 oz of meat, chicken, or fish. ? 1 egg. ?  cup of tofu.  Eat some foods each day that contain healthy fats, such as avocado, nuts, seeds, and fish. Lifestyle  Check your blood glucose regularly.  Exercise regularly as told by your health care provider. This may include: ? 150 minutes of moderate-intensity or vigorous-intensity exercise each week. This could be brisk walking, biking, or water aerobics. ? Stretching and doing strength exercises, such as yoga or weightlifting, at least 2 times a week.  Take medicines as told by your health care provider.  Do not use any products that contain nicotine or tobacco, such as cigarettes and e-cigarettes. If you need help quitting, ask your health care provider.  Work with a Social worker or diabetes educator to identify strategies to manage stress and any emotional and social challenges. Questions to ask a health care provider  Do I need to meet with a diabetes educator?  Do I need to meet with a dietitian?  What number  can I call if I have questions?  When are the best times to check my blood glucose? Where to find more information:  American Diabetes Association: diabetes.org  Academy of Nutrition and Dietetics: www.eatright.[employer] of Diabetes and Digestive and Kidney Diseases (NIH): [employer] Summary  A healthy meal plan will help you control your  blood glucose and maintain a healthy lifestyle.  Working with a diet and nutrition specialist (dietitian) can help you make a meal plan that is best for you.  Keep in mind that carbohydrates (carbs) and alcohol have immediate effects on your blood glucose levels. It is important to count carbs and to use alcohol carefully. This information is not intended to replace advice given to you by your health care provider. Make sure you discuss any questions you have with your health care provider. Document Released: 10/28/2004 Document Revised: 08/31/2016 Document Reviewed: 03/07/2016 Elsevier Interactive Patient Education  2019 Elsevier Inc.      Agustina Caroli, MD [facility]

## 2018-02-23 LAB — CMP14+EGFR
ALT: 18 IU/L (ref 0–44)
AST: 22 IU/L (ref 0–40)
Albumin/Globulin Ratio: 2.1 (ref 1.2–2.2)
Albumin: 4.4 g/dL (ref 3.6–4.8)
Alkaline Phosphatase: 83 IU/L (ref 39–117)
BUN/Creatinine Ratio: 13 (ref 10–24)
BUN: 14 mg/dL (ref 8–27)
Bilirubin Total: 0.3 mg/dL (ref 0.0–1.2)
CO2: 18 mmol/L — ABNORMAL LOW (ref 20–29)
Calcium: 9.2 mg/dL (ref 8.6–10.2)
Chloride: 106 mmol/L (ref 96–106)
Creatinine, Ser: 1.08 mg/dL (ref 0.76–1.27)
GFR calc Af Amer: 83 mL/min/{1.73_m2} (ref >59)
GFR calc non Af Amer: 72 mL/min/{1.73_m2} (ref >59)
Globulin, Total: 2.1 g/dL (ref 1.5–4.5)
Glucose: 179 mg/dL — ABNORMAL HIGH (ref 65–99)
Potassium: 4.2 mmol/L (ref 3.5–5.2)
Sodium: 143 mmol/L (ref 134–144)
Total Protein: 6.5 g/dL (ref 6.0–8.5)

## 2018-02-23 LAB — LIPID PANEL
Chol/HDL Ratio: 2.5 ratio (ref 0.0–5.0)
Cholesterol, Total: 109 mg/dL (ref 100–199)
HDL: 44 mg/dL (ref >39)
LDL Calculated: 51 mg/dL (ref 0–99)
Triglycerides: 69 mg/dL (ref 0–149)
VLDL Cholesterol Cal: 14 mg/dL (ref 5–40)

## 2018-06-14 ENCOUNTER — Other Ambulatory Visit: Payer: Self-pay | Admitting: *Deleted

## 2018-06-14 DIAGNOSIS — E119 Type 2 diabetes mellitus without complications: Secondary | ICD-10-CM

## 2018-06-14 DIAGNOSIS — I10 Essential (primary) hypertension: Secondary | ICD-10-CM

## 2018-06-14 DIAGNOSIS — E7800 Pure hypercholesterolemia, unspecified: Secondary | ICD-10-CM

## 2018-06-15 ENCOUNTER — Other Ambulatory Visit: Payer: Self-pay

## 2018-06-15 ENCOUNTER — Ambulatory Visit (INDEPENDENT_AMBULATORY_CARE_PROVIDER_SITE_OTHER): Payer: 59 | Admitting: Family Medicine

## 2018-06-15 DIAGNOSIS — I10 Essential (primary) hypertension: Secondary | ICD-10-CM

## 2018-06-15 DIAGNOSIS — E7800 Pure hypercholesterolemia, unspecified: Secondary | ICD-10-CM

## 2018-06-15 DIAGNOSIS — E119 Type 2 diabetes mellitus without complications: Secondary | ICD-10-CM | POA: Diagnosis not present

## 2018-06-15 LAB — CBC WITH DIFFERENTIAL/PLATELET
Basophils Absolute: 0.1 10*3/uL (ref 0.0–0.2)
Basos: 1 %
EOS (ABSOLUTE): 0.2 10*3/uL (ref 0.0–0.4)
Eos: 3 %
Hematocrit: 47.3 % (ref 37.5–51.0)
Hemoglobin: 15.7 g/dL (ref 13.0–17.7)
Immature Grans (Abs): 0 10*3/uL (ref 0.0–0.1)
Immature Granulocytes: 0 %
Lymphocytes Absolute: 2.2 10*3/uL (ref 0.7–3.1)
Lymphs: 29 %
MCH: 29 pg (ref 26.6–33.0)
MCHC: 33.2 g/dL (ref 31.5–35.7)
MCV: 87 fL (ref 79–97)
Monocytes Absolute: 0.7 10*3/uL (ref 0.1–0.9)
Monocytes: 9 %
Neutrophils Absolute: 4.3 10*3/uL (ref 1.4–7.0)
Neutrophils: 58 %
Platelets: 351 10*3/uL (ref 150–450)
RBC: 5.42 x10E6/uL (ref 4.14–5.80)
RDW: 13.7 % (ref 11.6–15.4)
WBC: 7.5 10*3/uL (ref 3.4–10.8)

## 2018-06-15 LAB — CMP14+EGFR
ALT: 19 IU/L (ref 0–44)
AST: 20 IU/L (ref 0–40)
Albumin/Globulin Ratio: 2.4 — ABNORMAL HIGH (ref 1.2–2.2)
Albumin: 5 g/dL — ABNORMAL HIGH (ref 3.8–4.8)
Alkaline Phosphatase: 93 IU/L (ref 39–117)
BUN/Creatinine Ratio: 16 (ref 10–24)
BUN: 17 mg/dL (ref 8–27)
Bilirubin Total: 0.3 mg/dL (ref 0.0–1.2)
CO2: 22 mmol/L (ref 20–29)
Calcium: 10.2 mg/dL (ref 8.6–10.2)
Chloride: 101 mmol/L (ref 96–106)
Creatinine, Ser: 1.06 mg/dL (ref 0.76–1.27)
GFR calc Af Amer: 85 mL/min/{1.73_m2} (ref >59)
GFR calc non Af Amer: 73 mL/min/{1.73_m2} (ref >59)
Globulin, Total: 2.1 g/dL (ref 1.5–4.5)
Glucose: 177 mg/dL — ABNORMAL HIGH (ref 65–99)
Potassium: 5.1 mmol/L (ref 3.5–5.2)
Sodium: 136 mmol/L (ref 134–144)
Total Protein: 7.1 g/dL (ref 6.0–8.5)

## 2018-06-15 LAB — HEMOGLOBIN A1C
Est. average glucose Bld gHb Est-mCnc: 163 mg/dL
Hgb A1c MFr Bld: 7.3 % — ABNORMAL HIGH (ref 4.8–5.6)

## 2018-06-15 LAB — LIPID PANEL
Chol/HDL Ratio: 2.7 ratio (ref 0.0–5.0)
Cholesterol, Total: 131 mg/dL (ref 100–199)
HDL: 48 mg/dL (ref >39)
LDL Calculated: 68 mg/dL (ref 0–99)
Triglycerides: 77 mg/dL (ref 0–149)
VLDL Cholesterol Cal: 15 mg/dL (ref 5–40)

## 2018-06-18 ENCOUNTER — Telehealth (INDEPENDENT_AMBULATORY_CARE_PROVIDER_SITE_OTHER): Payer: 59 | Admitting: Emergency Medicine

## 2018-06-18 ENCOUNTER — Other Ambulatory Visit: Payer: Self-pay

## 2018-06-18 ENCOUNTER — Encounter: Payer: Self-pay | Admitting: Emergency Medicine

## 2018-06-18 DIAGNOSIS — E1165 Type 2 diabetes mellitus with hyperglycemia: Secondary | ICD-10-CM

## 2018-06-18 DIAGNOSIS — I10 Essential (primary) hypertension: Secondary | ICD-10-CM

## 2018-06-18 DIAGNOSIS — E7800 Pure hypercholesterolemia, unspecified: Secondary | ICD-10-CM

## 2018-06-18 MED ORDER — SITAGLIPTIN PHOSPHATE 50 MG PO TABS: 50.0000 mg | ORAL_TABLET | Freq: Every day | ORAL | 5 refills | Status: DC

## 2018-06-18 NOTE — Progress Notes (Signed)
The 10-year ASCVD risk score Seth Pope Seth Pope., et al., 2013) is: 21.2%   Values used to calculate the score:     Age: 68 years     Sex: Male     Is Non-Hispanic African American: No     Diabetic: Yes     Tobacco smoker: No     Systolic Blood Pressure: 073 mmHg     Is BP treated: Yes     HDL Cholesterol: 48 mg/dL     Total Cholesterol: 131 mg/dL    Telemedicine Encounter- SOAP NOTE Established Patient Attempted video communication without success.  This telephone encounter was conducted with the patient's (or proxy's) verbal consent via audio telecommunications: yes/no: Yes Patient was instructed to have this encounter in a suitably private space; and to only have persons present to whom they give permission to participate. In addition, patient identity was confirmed by use of name plus two identifiers (DOB and address).  I discussed the limitations, risks, security and privacy concerns of performing an evaluation and management service by telephone and the availability of in person appointments. I also discussed with the patient that there may be a patient responsible charge related to this service. The patient expressed understanding and agreed to proceed.  I spent a total of TIME; 0 MIN TO 60 MIN: 15 minutes talking with the patient or their proxy.  No chief complaint on file.  Diabetes follow-up  Subjective   Seth Pope is a 68 y.o. male established patient. Telephone visit today for diabetes follow-up.  Blood work done 3 days ago shows the following results: Results for orders placed or performed in visit on 06/15/18 (from the past 72 hour(s))  Lipid panel     Status: None   Collection Time: 06/15/18 11:35 AM  Result Value Ref Range   Cholesterol, Total 131 100 - 199 mg/dL   Triglycerides 77 0 - 149 mg/dL   HDL 48 >39 mg/dL   VLDL Cholesterol Cal 15 5 - 40 mg/dL   LDL Calculated 68 0 - 99 mg/dL   Chol/HDL Ratio 2.7 0.0 - 5.0 ratio    Comment:                                    T. Chol/HDL Ratio                                             Men  Women                               1/2 Avg.Risk  3.4    3.3                                   Avg.Risk  5.0    4.4                                2X Avg.Risk  9.6    7.1  3X Avg.Risk 23.4   11.0   Hemoglobin A1c     Status: Abnormal   Collection Time: 06/15/18 11:35 AM  Result Value Ref Range   Hgb A1c MFr Bld 7.3 (H) 4.8 - 5.6 %    Comment:          Prediabetes: 5.7 - 6.4          Diabetes: >6.4          Glycemic control for adults with diabetes: <7.0    Est. average glucose Bld gHb Est-mCnc 163 mg/dL  CBC with Differential/Platelet     Status: None   Collection Time: 06/15/18 11:35 AM  Result Value Ref Range   WBC 7.5 3.4 - 10.8 x10E3/uL   RBC 5.42 4.14 - 5.80 x10E6/uL   Hemoglobin 15.7 13.0 - 17.7 g/dL   Hematocrit 47.3 37.5 - 51.0 %   MCV 87 79 - 97 fL   MCH 29.0 26.6 - 33.0 pg   MCHC 33.2 31.5 - 35.7 g/dL   RDW 13.7 11.6 - 15.4 %   Platelets 351 150 - 450 x10E3/uL   Neutrophils 58 Not Estab. %   Lymphs 29 Not Estab. %   Monocytes 9 Not Estab. %   Eos 3 Not Estab. %   Basos 1 Not Estab. %   Neutrophils Absolute 4.3 1.4 - 7.0 x10E3/uL   Lymphocytes Absolute 2.2 0.7 - 3.1 x10E3/uL   Monocytes Absolute 0.7 0.1 - 0.9 x10E3/uL   EOS (ABSOLUTE) 0.2 0.0 - 0.4 x10E3/uL   Basophils Absolute 0.1 0.0 - 0.2 x10E3/uL   Immature Granulocytes 0 Not Estab. %   Immature Grans (Abs) 0.0 0.0 - 0.1 x10E3/uL  CMP14+EGFR     Status: Abnormal   Collection Time: 06/15/18 11:35 AM  Result Value Ref Range   Glucose 177 (H) 65 - 99 mg/dL   BUN 17 8 - 27 mg/dL   Creatinine, Ser 1.06 0.76 - 1.27 mg/dL   GFR calc non Af Amer 73 >59 mL/min/1.73   GFR calc Af Amer 85 >59 mL/min/1.73   BUN/Creatinine Ratio 16 10 - 24   Sodium 136 134 - 144 mmol/L   Potassium 5.1 3.5 - 5.2 mmol/L   Chloride 101 96 - 106 mmol/L   CO2 22 20 - 29 mmol/L   Calcium 10.2 8.6 - 10.2 mg/dL   Total Protein 7.1 6.0 - 8.5  g/dL   Albumin 5.0 (H) 3.8 - 4.8 g/dL   Globulin, Total 2.1 1.5 - 4.5 g/dL   Albumin/Globulin Ratio 2.4 (H) 1.2 - 2.2   Bilirubin Total 0.3 0.0 - 1.2 mg/dL   Alkaline Phosphatase 93 39 - 117 IU/L   AST 20 0 - 40 IU/L   ALT 19 0 - 44 IU/L     HPI   Patient Active Problem List   Diagnosis Date Noted  . History of shingles 02/28/2017  . Post herpetic neuralgia 02/28/2017  . Family history of coronary artery disease in father 10/23/2013  . Tobacco abuse 10/17/2013  . Unstable angina ([place]) 10/04/2013  . PVC's (premature ventricular contractions) 10/04/2013  . Neuropathy, diabetic ([place]) 01/31/2012  . Hypertension   . DM2 (diabetes mellitus, type 2) ([place])   . Hyperlipidemia     Past Medical History:  Diagnosis Date  . Anxiety   . CAD (coronary artery disease)    a. cath 10/23/13: nonobstructive CAD, nl LV function, rec risk factor modification  . Chronic kidney disease    h/o kidney stone  . Diabetes mellitus  type 2  . History of pneumonia   . Hyperlipidemia   . Hypertension   . Sinus bradycardia    a. as low as 36 bpm; b. multiple pauses 2.1-2.3 seconds  . Ulcer   . Umbilical hernia     Current Outpatient Medications  Medication Sig Dispense Refill  . ALPRAZolam (XANAX XR) 1 MG 24 hr tablet Take 1 mg by mouth daily.    Marland Kitchen ALPRAZolam (XANAX) 0.25 MG tablet TK ONE T PO PRN D  0  . aspirin 81 MG tablet Take 81 mg by mouth every other day.     Marland Kitchen atorvastatin (LIPITOR) 80 MG tablet Take 1 tablet (80 mg total) by mouth daily. 90 tablet 3  . cyclobenzaprine (FLEXERIL) 5 MG tablet Take 1 or 2 pills 3 times daily when needed for muscle relaxant. 90 tablet 1  . desipramine (NORPRAMIN) 50 MG tablet Take 150 mg by mouth at bedtime.     . gabapentin (NEURONTIN) 300 MG capsule Take 2 capsules (600 mg total) by mouth 2 (two) times daily. 360 capsule 1  . hydrochlorothiazide (MICROZIDE) 12.5 MG capsule TAKE 1 CAPSULE(12.5 MG) BY MOUTH DAILY 90 capsule 1  . HYDROcodone-acetaminophen  (NORCO/VICODIN) 5-325 MG tablet Take 1 tablet by mouth every 6 (six) hours as needed for moderate pain. 20 tablet 0  . ibuprofen (ADVIL,MOTRIN) 800 MG tablet Take 1 tablet (800 mg total) by mouth every 8 (eight) hours as needed. 30 tablet 0  . ketoconazole (NIZORAL) 2 % shampoo Apply 1 application topically 2 (two) times a week.  2  . lisinopril (PRINIVIL,ZESTRIL) 10 MG tablet Take 1 tablet (10 mg total) by mouth daily. 90 tablet 1  . metFORMIN (GLUCOPHAGE-XR) 500 MG 24 hr tablet Take 2 tablets by mouth in the morning and 1 tablet by mouth in the evening.   Fill at patient's request 270 tablet 1  . nitroGLYCERIN (NITROSTAT) 0.4 MG SL tablet Place one under the tongue as needed for chest pain. Repeat in 5 minutes if no effect. Call 911 if you need to take the medication 30 tablet 3  . ONETOUCH DELICA LANCETS FINE MISC USE AS DIRECTED TO CHECK BLOOD GLUCOSE UP TO THREE TIMES DAILY 100 each 0  . ONETOUCH VERIO test strip USE TO CHECK BLOOD SUGAR UP TO THREE TIMES DAILY. 100 each 0  . traMADol (ULTRAM) 50 MG tablet Take 1 tablet (50 mg total) by mouth every 6 (six) hours as needed. 20 tablet 0  . metoprolol tartrate (LOPRESSOR) 100 MG tablet Take 1 tablet (100 mg total) by mouth 2 (two) times daily. TAKE 1 TABLET(100 MG) BY MOUTH TWICE DAILY 180 tablet 1   No current facility-administered medications for this visit.     Allergies  Allergen Reactions  . Penicillins Rash    Has patient had a PCN reaction causing immediate rash, facial/tongue/throat swelling, SOB or lightheadedness with hypotension: Yes Has patient had a PCN reaction causing severe rash involving mucus membranes or skin necrosis: No Has patient had a PCN reaction that required hospitalization No Has patient had a PCN reaction occurring within the last 10 years: No If all of the above answers are "NO", then may proceed with Cephalosporin use.     Social History   Socioeconomic History  . Marital status: Married    Spouse name:  Abigail Butts  . Number of children: 2  . Years of education: 10th   . Highest education level: Not on file  Occupational History  . Occupation: self-employed (property  managment)  Social Needs  . Financial resource strain: Not on file  . Food insecurity:    Worry: Not on file    Inability: Not on file  . Transportation needs:    Medical: Not on file    Non-medical: Not on file  Tobacco Use  . Smoking status: Former Smoker    Packs/day: 2.50    Years: 18.00    Pack years: 45.00    Types: Cigarettes    Last attempt to quit: 07/16/1986    Years since quitting: 31.9  . Smokeless tobacco: Never Used  Substance and Sexual Activity  . Alcohol use: Yes    Comment: social  . Drug use: Yes    Types: Marijuana    Comment: 2-3 times a week  . Sexual activity: Yes  Lifestyle  . Physical activity:    Days per week: Not on file    Minutes per session: Not on file  . Stress: Not on file  Relationships  . Social connections:    Talks on phone: Not on file    Gets together: Not on file    Attends religious service: Not on file    Active member of club or organization: Not on file    Attends meetings of clubs or organizations: Not on file    Relationship status: Not on file  . Intimate partner violence:    Fear of current or ex partner: Not on file    Emotionally abused: Not on file    Physically abused: Not on file    Forced sexual activity: Not on file  Other Topics Concern  . Not on file  Social History Narrative   Lives at home w/ his wife   Right-handed   Daily caffeine    Review of Systems  Constitutional: Negative.  Negative for chills and fever.  HENT: Negative.  Negative for congestion, nosebleeds and sore throat.   Eyes: Negative.  Negative for blurred vision and double vision.  Respiratory: Negative.  Negative for cough and shortness of breath.   Cardiovascular: Negative.  Negative for chest pain and palpitations.  Gastrointestinal: Negative for abdominal pain, diarrhea,  nausea and vomiting.  Genitourinary: Negative.  Negative for dysuria and hematuria.  Musculoskeletal: Negative for myalgias.  Skin: Negative.  Negative for rash.  Neurological: Positive for sensory change (Intermittent chronic numbness to thumbs). Negative for dizziness and headaches.  Endo/Heme/Allergies: Negative.   All other systems reviewed and are negative.   Objective   Vitals as reported by the patient: None available There were no vitals filed for this visit. Awake and oriented x3 in no apparent respiratory distress There are no diagnoses linked to this encounter. Hemoglobin A1c at 7.3, unchanged from previous time despite compliance with medication.  Will start Januvia 50 mg a day.  Jardiance or Invokana not on patient's insurance formulary.  Continue metformin along with diet and exercise. Follow-up in 1 to 2 months. Diagnoses and all orders for this visit:  Type 2 diabetes mellitus with hyperglycemia, without long-term current use of insulin (HCC) -     sitaGLIPtin (JANUVIA) 50 MG tablet; Take 1 tablet (50 mg total) by mouth daily.  Essential hypertension  Pure hypercholesterolemia     I discussed the assessment and treatment plan with the patient. The patient was provided an opportunity to ask questions and all were answered. The patient agreed with the plan and demonstrated an understanding of the instructions.   The patient was advised to call back or seek an  in-person evaluation if the symptoms worsen or if the condition fails to improve as anticipated.  I provided 15 minutes of non-face-to-face time during this encounter.  Horald Pollen, MD  [facility]

## 2018-06-18 NOTE — Progress Notes (Signed)
Pt c/o follow up on Diabetes and BP. Also would like to have medications refilled. And is having some pain in thumbs that weber was taking care of.

## 2018-06-20 ENCOUNTER — Telehealth: Payer: Self-pay | Admitting: Emergency Medicine

## 2018-06-20 NOTE — Telephone Encounter (Signed)
Copied from [place] [id number]. Topic: Quick Communication - Rx Refill/Question >> Jun 20, 2018 12:48 PM Reyne Dumas L wrote: Medication: sitaGLIPtin (JANUVIA) 50 MG tablet  Pt states that pharmacist told him that the doctor needs to get this approved through his insurance first. Pt can be reached at [phone]

## 2018-06-23 IMAGING — DX DG FINGER THUMB 2+V*R*
3 series · 3 of 3 positions shown · non-contrast
Comparison: Contralateral left thumb series today reported
separately.

CLINICAL DATA: 66-year-old male with bilateral thumb pain worse in
the mornings. Mild swelling, no erythema. Suspect osteoarthritis.

EXAM:
RIGHT THUMB 2+V

[finger ap]
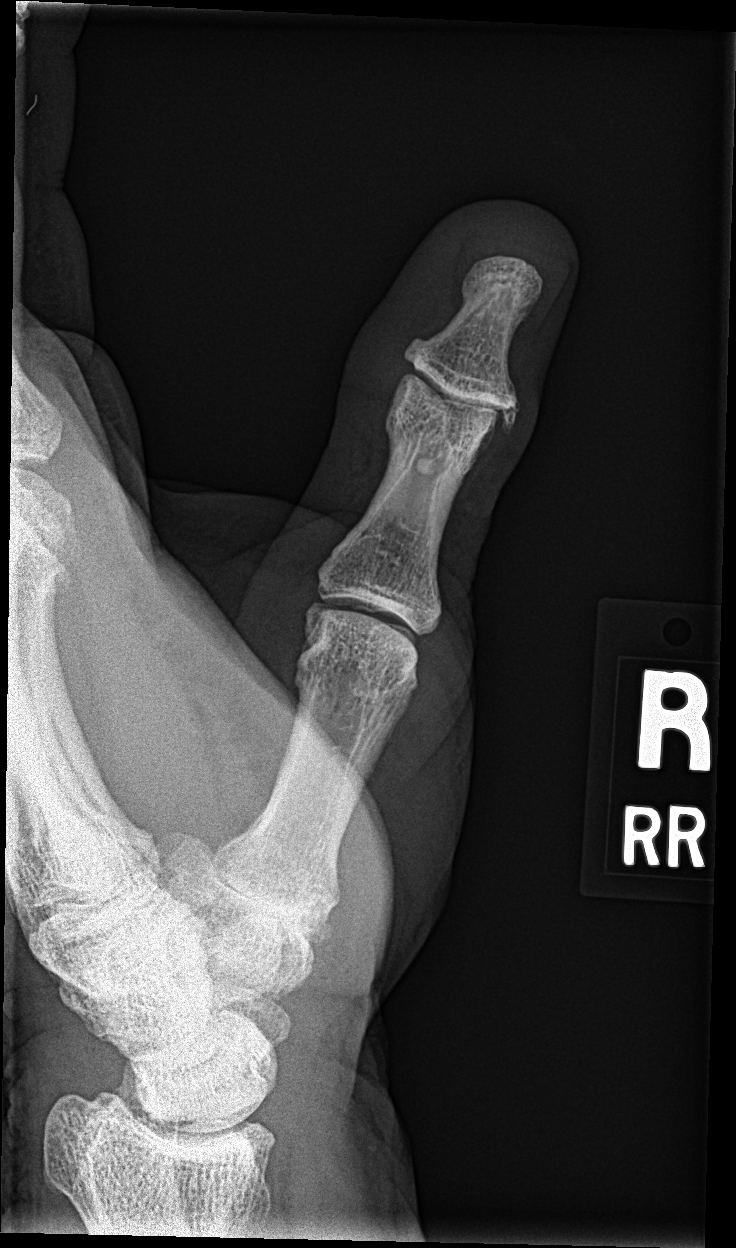

[finger obl]
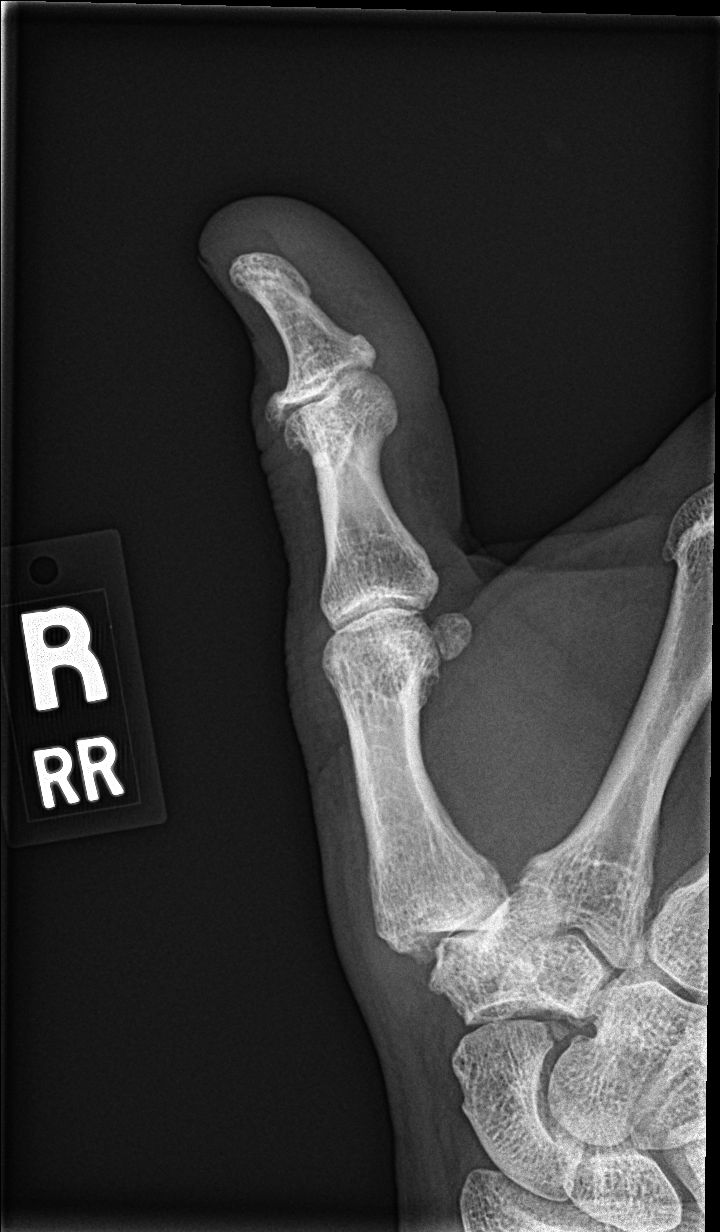

[finger lat]
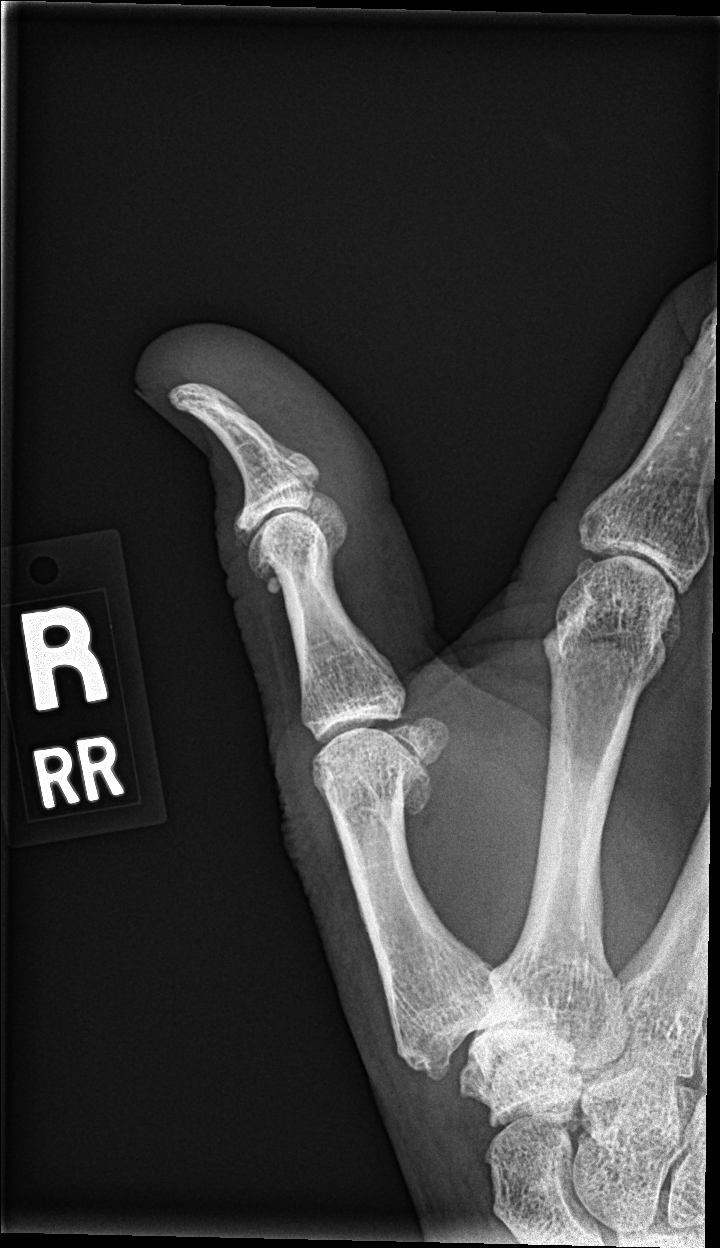

[3 of 3 positions shown; findings below may reference images not displayed]

FINDINGS: Mild right 1st CMC, basal thumb joint loss and subchondral
sclerosis. The right thumb MCP joint appears within normal limits
for age. Moderate joint space loss and osteophytosis at the right
thumb IP joint. No acute osseous abnormality identified.
IMPRESSION: Moderate right thumb osteoarthritis limited to the IP joint. No
acute osseous abnormality.

## 2018-06-25 ENCOUNTER — Telehealth: Payer: Self-pay | Admitting: Emergency Medicine

## 2018-06-25 NOTE — Telephone Encounter (Signed)
I need this patient's address and insurance number please.  Thanks

## 2018-06-25 NOTE — Telephone Encounter (Signed)
Copied from [place] [phone]. Topic: General - Other >> Jun 25, 2018  8:08 AM Seth Pope wrote: Reason for CRM: Patient called to check on the status of the of the Prior authorization for sitaGLIPtin (JANUVIA) 50 MG tablet he will also be contacting the insurance company to see what they are willing to pay for if not this particular medication.

## 2018-06-25 NOTE — Telephone Encounter (Signed)
Copied from [place] [id number]. Topic: General - Other >> Jun 25, 2018  8:08 AM Seth Pope wrote: Reason for CRM: Patient called to check on the status of the of the Prior authorization for sitaGLIPtin (JANUVIA) 50 MG tablet he will also be contacting the insurance company to see what they are willing to pay for if not this particular medication.

## 2018-06-25 NOTE — Telephone Encounter (Signed)
Copied from [place] [phone]. Topic: General - Other >> Jun 25, 2018  8:08 AM Leward Quan A wrote: Reason for CRM: Patient called to check on the status of the of the Prior authorization for sitaGLIPtin (JANUVIA) 50 MG tablet he will also be contacting the insurance company to see what they are willing to pay for if not this particular medication.

## 2018-06-25 NOTE — Telephone Encounter (Signed)
Need patient address and insurance number.  Thanks.

## 2018-06-25 NOTE — Telephone Encounter (Signed)
Copied from [place] [id number]. Topic: Quick Communication - See Telephone Encounter >> Jun 25, 2018  8:45 AM Loma Boston wrote: Reason for CRM: Patient called to check on the status of the of the Prior authorization for sitaGLIPtin (JANUVIA) 50 MG tablet he will also be contacting the insurance company to see what they are willing to pay for if not this particular medication. UPDATE   Pt has called back and says he called the ins co and they [facility] says the dr just needs to call [phone]. He says to contact him with the next step

## 2018-06-25 NOTE — Telephone Encounter (Signed)
Pa done  KEY [id number]

## 2018-06-27 NOTE — Telephone Encounter (Signed)
Patient just checked with [facility] and they advised him that they have still not heard back from [facility]. Please advise.

## 2018-06-28 ENCOUNTER — Other Ambulatory Visit: Payer: Self-pay | Admitting: Emergency Medicine

## 2018-06-28 MED ORDER — LINAGLIPTIN 5 MG PO TABS: 5.0000 mg | ORAL_TABLET | Freq: Every day | ORAL | 5 refills | Status: DC

## 2018-06-28 NOTE — Telephone Encounter (Signed)
Januvia rejected. Form given to Dr. Mitchel Honour with medications that are covered.

## 2018-07-12 NOTE — Telephone Encounter (Signed)
[place], [place] [address]   [place] [phone]

## 2018-08-19 ENCOUNTER — Other Ambulatory Visit: Payer: Self-pay | Admitting: Emergency Medicine

## 2018-08-19 DIAGNOSIS — E119 Type 2 diabetes mellitus without complications: Secondary | ICD-10-CM

## 2018-08-19 DIAGNOSIS — I10 Essential (primary) hypertension: Secondary | ICD-10-CM

## 2018-08-19 NOTE — Telephone Encounter (Signed)
Requested Prescriptions  Pending Prescriptions Disp Refills  . metFORMIN (GLUCOPHAGE-XR) 500 MG 24 hr tablet [Pharmacy Med Name: METFORMIN ER 500MG 24HR TABS] 270 tablet 1    Sig: TAKE 2 TABLETS BY MOUTH EVERY MORNING AND TAKE 1 TABLET BY MOUTH EVERY EVENING     Endocrinology:  Diabetes - Biguanides Passed - 08/19/2018  2:27 PM      Passed - Cr in normal range and within 360 days    Creat  Date Value Ref Range Status  08/13/2015 1.09 0.70 - 1.25 mg/dL Final    Comment:      For patients > or = 66 years of age: The upper reference limit for Creatinine is approximately 13% higher for people identified as African-American.      Creatinine, Ser  Date Value Ref Range Status  06/15/2018 1.06 0.76 - 1.27 mg/dL Final         Passed - HBA1C is between 0 and 7.9 and within 180 days    Hgb A1c MFr Bld  Date Value Ref Range Status  06/15/2018 7.3 (H) 4.8 - 5.6 % Final    Comment:             Prediabetes: 5.7 - 6.4          Diabetes: >6.4          Glycemic control for adults with diabetes: <7.0          Passed - eGFR in normal range and within 360 days    GFR, Est African American  Date Value Ref Range Status  08/13/2015 84 >=60 mL/min Final   GFR calc Af Amer  Date Value Ref Range Status  06/15/2018 85 >59 mL/min/1.73 Final   GFR, Est Non African American  Date Value Ref Range Status  08/13/2015 72 >=60 mL/min Final   GFR calc non Af Amer  Date Value Ref Range Status  06/15/2018 73 >59 mL/min/1.73 Final         Passed - Valid encounter within last 6 months    Recent Outpatient Visits          2 months ago Essential hypertension   [facility] at [place], [place]   5 months ago Type 2 diabetes mellitus without complication, without long-term current use of insulin [facility])   [facility] at [facility], [place], [place]   9 months ago Pure hypercholesterolemia   [facility], Damaris Hippo, PA-C   1 year ago Bilateral thumb pain   [facility], Damaris Hippo, PA-C   1 year ago Diabetic polyneuropathy associated with type 2 diabetes mellitus ([place])   [facility], Jamyra Zweig L, PA-C             . metoprolol tartrate (LOPRESSOR) 100 MG tablet [Pharmacy Med Name: METOPROLOL TARTRATE 100MG TABLETS] 180 tablet 1    Sig: TAKE 1 TABLET(100 MG) BY MOUTH TWICE DAILY     Cardiovascular:  Beta Blockers Passed - 08/19/2018  2:27 PM      Passed - Last BP in normal range    BP Readings from Last 1 Encounters:  02/22/18 130/65         Passed - Last Heart Rate in normal range    Pulse Readings from Last 1 Encounters:  02/22/18 63         Passed - Valid encounter within last 6 months    Recent Outpatient Visits  2 months ago Essential hypertension   [facility] at [facility], [place], MD   5 months ago Type 2 diabetes mellitus without complication, without long-term current use of insulin [facility])   Primary Care at [facility], [place], MD   9 months ago Pure hypercholesterolemia   [facility] at [place], PA-C   1 year ago Bilateral thumb pain   [facility] at [place], PA-C   1 year ago Diabetic polyneuropathy associated with type 2 diabetes mellitus ([place])   [facility] at [place], PA-C             . hydrochlorothiazide (MICROZIDE) 12.5 MG capsule [Pharmacy Med Name: HYDROCHLOROTHIAZIDE 12.5MG CAPSULES] 90 capsule 1    Sig: TAKE 1 CAPSULE(12.5 MG) BY MOUTH DAILY     Cardiovascular: Diuretics - Thiazide Passed - 08/19/2018  2:27 PM      Passed - Ca in normal range and within 360 days    Calcium  Date Value Ref Range Status  06/15/2018 10.2 8.6 - 10.2 mg/dL Final         Passed - Cr in normal range and within 360 days    Creat  Date Value Ref Range Status  08/13/2015 1.09 0.70 - 1.25 mg/dL Final    Comment:      For patients > or = 66 years of age: The upper reference limit for Creatinine is approximately 13% higher for people identified  as African-American.      Creatinine, Ser  Date Value Ref Range Status  06/15/2018 1.06 0.76 - 1.27 mg/dL Final         Passed - K in normal range and within 360 days    Potassium  Date Value Ref Range Status  06/15/2018 5.1 3.5 - 5.2 mmol/L Final         Passed - Na in normal range and within 360 days    Sodium  Date Value Ref Range Status  06/15/2018 136 134 - 144 mmol/L Final         Passed - Last BP in normal range    BP Readings from Last 1 Encounters:  02/22/18 130/65         Passed - Valid encounter within last 6 months    Recent Outpatient Visits          2 months ago Essential hypertension   [facility] at [place], [place]   5 months ago Type 2 diabetes mellitus without complication, without long-term current use of insulin [facility])   Primary Care at [facility], [place], [place]   9 months ago Pure hypercholesterolemia   [facility] [place], PA-C   1 year ago Bilateral thumb pain   [facility] at [place], PA-C   1 year ago Diabetic polyneuropathy associated with type 2 diabetes mellitus [facility])   [facility] at [place], PA-C             '

## 2018-10-16 ENCOUNTER — Other Ambulatory Visit: Payer: Self-pay | Admitting: Emergency Medicine

## 2018-10-16 DIAGNOSIS — I10 Essential (primary) hypertension: Secondary | ICD-10-CM

## 2018-10-16 NOTE — Telephone Encounter (Signed)
Requested medication (s) are due for refill today:yes  Requested medication (s) are on the active medication list: yes  Last refill:  07/23/2018  Future visit scheduled: no  Notes to clinic:  Review for refill   Requested Prescriptions  Pending Prescriptions Disp Refills   lisinopril (ZESTRIL) 10 MG tablet [Pharmacy Med Name: LISINOPRIL 10MG  TABLETS] 90 tablet 1    Sig: TAKE 1 TABLET(10 MG) BY MOUTH DAILY     Cardiovascular:  ACE Inhibitors Failed - 10/16/2018 10:30 AM      Failed - Valid encounter within last 6 months    Recent Outpatient Visits          4 months ago Essential hypertension   Primary Care at [facility], [place], [place]   7 months ago Type 2 diabetes mellitus without complication, without long-term current use of insulin [facility])   Primary Care at [facility], [place], MD   11 months ago Pure hypercholesterolemia   [facility], Damaris Hippo, PA-C   1 year ago Bilateral thumb pain   [facility], Damaris Hippo, PA-C   1 year ago Diabetic polyneuropathy associated with type 2 diabetes mellitus [facility])   Primary Care at [place], PA-C             Passed - Cr in normal range and within 180 days    Creat  Date Value Ref Range Status  08/13/2015 1.09 0.70 - 1.25 mg/dL Final    Comment:      For patients > or = 66 years of age: The upper reference limit for Creatinine is approximately 13% higher for people identified as African-American.      Creatinine, Ser  Date Value Ref Range Status  06/15/2018 1.06 0.76 - 1.27 mg/dL Final         Passed - K in normal range and within 180 days    Potassium  Date Value Ref Range Status  06/15/2018 5.1 3.5 - 5.2 mmol/L Final         Passed - Patient is not pregnant      Passed - Last BP in normal range    BP Readings from Last 1 Encounters:  02/22/18 130/65

## 2018-11-16 ENCOUNTER — Telehealth: Payer: Self-pay | Admitting: Emergency Medicine

## 2018-11-16 NOTE — Telephone Encounter (Signed)
Pt needs appt for med refills. [id number]

## 2018-11-20 ENCOUNTER — Encounter: Payer: Self-pay | Admitting: Emergency Medicine

## 2018-11-20 ENCOUNTER — Ambulatory Visit: Payer: 59 | Admitting: Emergency Medicine

## 2018-11-20 ENCOUNTER — Other Ambulatory Visit: Payer: Self-pay

## 2018-11-20 VITALS — BP 114/69 | HR 49 | Temp 98.0°F | Resp 16 | Ht 70.5 in | Wt 183.4 lb

## 2018-11-20 DIAGNOSIS — Z23 Encounter for immunization: Secondary | ICD-10-CM

## 2018-11-20 DIAGNOSIS — I152 Hypertension secondary to endocrine disorders: Secondary | ICD-10-CM

## 2018-11-20 DIAGNOSIS — E1169 Type 2 diabetes mellitus with other specified complication: Secondary | ICD-10-CM | POA: Diagnosis not present

## 2018-11-20 DIAGNOSIS — E1159 Type 2 diabetes mellitus with other circulatory complications: Secondary | ICD-10-CM

## 2018-11-20 DIAGNOSIS — I10 Essential (primary) hypertension: Secondary | ICD-10-CM | POA: Diagnosis not present

## 2018-11-20 DIAGNOSIS — E785 Hyperlipidemia, unspecified: Secondary | ICD-10-CM

## 2018-11-20 MED ORDER — NITROGLYCERIN 0.4 MG SL SUBL: SUBLINGUAL_TABLET | SUBLINGUAL | 3 refills | Status: DC

## 2018-11-20 MED ORDER — LISINOPRIL 10 MG PO TABS: 10.0000 mg | ORAL_TABLET | Freq: Every day | ORAL | 3 refills | Status: DC

## 2018-11-20 NOTE — Patient Instructions (Addendum)
   If you have lab work done today you will be contacted with your lab results within the next 2 weeks.  If you have not heard from us then please contact us. The fastest way to get your results is to register for My Chart.   IF you received an x-Cassidy today, you will receive an invoice from [facility]. Please contact [facility] at [phone] with questions or concerns regarding your invoice.   IF you received labwork today, you will receive an invoice from [facility]. Please contact [facility] at [phone] with questions or concerns regarding your invoice.   Our billing staff will not be able to assist you with questions regarding bills from these companies.  You will be contacted with the lab results as soon as they are available. The fastest way to get your results is to activate your My Chart account. Instructions are located on the last page of this paperwork. If you have not heard from us regarding the results in 2 weeks, please contact this office.     Hypertension, Adult High blood pressure (hypertension) is when the force of blood pumping through the arteries is too strong. The arteries are the blood vessels that carry blood from the heart throughout the body. Hypertension forces the heart to work harder to pump blood and may cause arteries to become narrow or stiff. Untreated or uncontrolled hypertension can cause a heart attack, heart failure, a stroke, kidney disease, and other problems. A blood pressure reading consists of a higher number over a lower number. Ideally, your blood pressure should be below 120/80. The first ("top") number is called the systolic pressure. It is a measure of the pressure in your arteries as your heart beats. The second ("bottom") number is called the diastolic pressure. It is a measure of the pressure in your arteries as the heart relaxes. What are the causes? The exact cause of this condition is not known. There are some conditions  that result in or are related to high blood pressure. What increases the risk? Some risk factors for high blood pressure are under your control. The following factors may make you more likely to develop this condition:  Smoking.  Having type 2 diabetes mellitus, high cholesterol, or both.  Not getting enough exercise or physical activity.  Being overweight.  Having too much fat, sugar, calories, or salt (sodium) in your diet.  Drinking too much alcohol. Some risk factors for high blood pressure may be difficult or impossible to change. Some of these factors include:  Having chronic kidney disease.  Having a family history of high blood pressure.  Age. Risk increases with age.  Race. You may be at higher risk if you are African American.  Gender. Men are at higher risk than women before age 45. After age 65, women are at higher risk than men.  Having obstructive sleep apnea.  Stress. What are the signs or symptoms? High blood pressure may not cause symptoms. Very high blood pressure (hypertensive crisis) may cause:  Headache.  Anxiety.  Shortness of breath.  Nosebleed.  Nausea and vomiting.  Vision changes.  Severe chest pain.  Seizures. How is this diagnosed? This condition is diagnosed by measuring your blood pressure while you are seated, with your arm resting on a flat surface, your legs uncrossed, and your feet flat on the floor. The cuff of the blood pressure monitor will be placed directly against the skin of your upper arm at the level of your heart.   It should be measured at least twice using the same arm. Certain conditions can cause a difference in blood pressure between your right and left arms. Certain factors can cause blood pressure readings to be lower or higher than normal for a short period of time:  When your blood pressure is higher when you are in a health care provider's office than when you are at home, this is called white coat hypertension.  Most people with this condition do not need medicines.  When your blood pressure is higher at home than when you are in a health care provider's office, this is called masked hypertension. Most people with this condition may need medicines to control blood pressure. If you have a high blood pressure reading during one visit or you have normal blood pressure with other risk factors, you may be asked to:  Return on a different day to have your blood pressure checked again.  Monitor your blood pressure at home for 1 week or longer. If you are diagnosed with hypertension, you may have other blood or imaging tests to help your health care provider understand your overall risk for other conditions. How is this treated? This condition is treated by making healthy lifestyle changes, such as eating healthy foods, exercising more, and reducing your alcohol intake. Your health care provider may prescribe medicine if lifestyle changes are not enough to get your blood pressure under control, and if:  Your systolic blood pressure is above 130.  Your diastolic blood pressure is above 80. Your personal target blood pressure may vary depending on your medical conditions, your age, and other factors. Follow these instructions at home: Eating and drinking   Eat a diet that is high in fiber and potassium, and low in sodium, added sugar, and fat. An example eating plan is called the DASH (Dietary Approaches to Stop Hypertension) diet. To eat this way: ? Eat plenty of fresh fruits and vegetables. Try to fill one half of your plate at each meal with fruits and vegetables. ? Eat whole grains, such as whole-wheat pasta, brown rice, or whole-grain bread. Fill about one fourth of your plate with whole grains. ? Eat or drink low-fat dairy products, such as skim milk or low-fat yogurt. ? Avoid fatty cuts of meat, processed or cured meats, and poultry with skin. Fill about one fourth of your plate with lean proteins, such  as fish, chicken without skin, beans, eggs, or tofu. ? Avoid pre-made and processed foods. These tend to be higher in sodium, added sugar, and fat.  Reduce your daily sodium intake. Most people with hypertension should eat less than 1,500 mg of sodium a day.  Do not drink alcohol if: ? Your health care provider tells you not to drink. ? You are pregnant, may be pregnant, or are planning to become pregnant.  If you drink alcohol: ? Limit how much you use to:  0-1 drink a day for women.  0-2 drinks a day for men. ? Be aware of how much alcohol is in your drink. In the [place] one drink equals one 12 oz bottle of beer (355 mL), one 5 oz glass of wine (148 mL), or one 1 oz glass of hard liquor (44 mL). Lifestyle   Work with your health care provider to maintain a healthy body weight or to lose weight. Ask what an ideal weight is for you.  Get at least 30 minutes of exercise most days of the week. Activities may include walking, swimming, or   biking.  Include exercise to strengthen your muscles (resistance exercise), such as Pilates or lifting weights, as part of your weekly exercise routine. Try to do these types of exercises for 30 minutes at least 3 days a week.  Do not use any products that contain nicotine or tobacco, such as cigarettes, e-cigarettes, and chewing tobacco. If you need help quitting, ask your health care provider.  Monitor your blood pressure at home as told by your health care provider.  Keep all follow-up visits as told by your health care provider. This is important. Medicines  Take over-the-counter and prescription medicines only as told by your health care provider. Follow directions carefully. Blood pressure medicines must be taken as prescribed.  Do not skip doses of blood pressure medicine. Doing this puts you at risk for problems and can make the medicine less effective.  Ask your health care provider about side effects or reactions to medicines that you  should watch for. Contact a health care provider if you:  Think you are having a reaction to a medicine you are taking.  Have headaches that keep coming back (recurring).  Feel dizzy.  Have swelling in your ankles.  Have trouble with your vision. Get help right away if you:  Develop a severe headache or confusion.  Have unusual weakness or numbness.  Feel faint.  Have severe pain in your chest or abdomen.  Vomit repeatedly.  Have trouble breathing. Summary  Hypertension is when the force of blood pumping through your arteries is too strong. If this condition is not controlled, it may put you at risk for serious complications.  Your personal target blood pressure may vary depending on your medical conditions, your age, and other factors. For most people, a normal blood pressure is less than 120/80.  Hypertension is treated with lifestyle changes, medicines, or a combination of both. Lifestyle changes include losing weight, eating a healthy, low-sodium diet, exercising more, and limiting alcohol. This information is not intended to replace advice given to you by your health care provider. Make sure you discuss any questions you have with your health care provider. Document Released: 01/31/2005 Document Revised: 10/11/2017 Document Reviewed: 10/11/2017 Elsevier Patient Education  [place].  Diabetes Mellitus and Nutrition, Adult When you have diabetes (diabetes mellitus), it is very important to have healthy eating habits because your blood sugar (glucose) levels are greatly affected by what you eat and drink. Eating healthy foods in the appropriate amounts, at about the same times every day, can help you:  Control your blood glucose.  Lower your risk of heart disease.  Improve your blood pressure.  Reach or maintain a healthy weight. Every person with diabetes is different, and each person has different needs for a meal plan. Your health care provider may  recommend that you work with a diet and nutrition specialist (dietitian) to make a meal plan that is best for you. Your meal plan may vary depending on factors such as:  The calories you need.  The medicines you take.  Your weight.  Your blood glucose, blood pressure, and cholesterol levels.  Your activity level.  Other health conditions you have, such as heart or kidney disease. How do carbohydrates affect me? Carbohydrates, also called carbs, affect your blood glucose level more than any other type of food. Eating carbs naturally raises the amount of glucose in your blood. Carb counting is a method for keeping track of how many carbs you eat. Counting carbs is important to keep your  blood glucose at a healthy level, especially if you use insulin or take certain oral diabetes medicines. It is important to know how many carbs you can safely have in each meal. This is different for every person. Your dietitian can help you calculate how many carbs you should have at each meal and for each snack. Foods that contain carbs include:  Bread, cereal, rice, pasta, and crackers.  Potatoes and corn.  Peas, beans, and lentils.  Milk and yogurt.  Fruit and juice.  Desserts, such as cakes, cookies, ice cream, and candy. How does alcohol affect me? Alcohol can cause a sudden decrease in blood glucose (hypoglycemia), especially if you use insulin or take certain oral diabetes medicines. Hypoglycemia can be a life-threatening condition. Symptoms of hypoglycemia (sleepiness, dizziness, and confusion) are similar to symptoms of having too much alcohol. If your health care provider says that alcohol is safe for you, follow these guidelines:  Limit alcohol intake to no more than 1 drink per day for nonpregnant women and 2 drinks per day for men. One drink equals 12 oz of beer, 5 oz of wine, or 1 oz of hard liquor.  Do not drink on an empty stomach.  Keep yourself hydrated with water, diet soda, or  unsweetened iced tea.  Keep in mind that regular soda, juice, and other mixers may contain a lot of sugar and must be counted as carbs. What are tips for following this plan?  Reading food labels  Start by checking the serving size on the "Nutrition Facts" label of packaged foods and drinks. The amount of calories, carbs, fats, and other nutrients listed on the label is based on one serving of the item. Many items contain more than one serving per package.  Check the total grams (g) of carbs in one serving. You can calculate the number of servings of carbs in one serving by dividing the total carbs by 15. For example, if a food has 30 g of total carbs, it would be equal to 2 servings of carbs.  Check the number of grams (g) of saturated and trans fats in one serving. Choose foods that have low or no amount of these fats.  Check the number of milligrams (mg) of salt (sodium) in one serving. Most people should limit total sodium intake to less than 2,300 mg per day.  Always check the nutrition information of foods labeled as "low-fat" or "nonfat". These foods may be higher in added sugar or refined carbs and should be avoided.  Talk to your dietitian to identify your daily goals for nutrients listed on the label. Shopping  Avoid buying canned, premade, or processed foods. These foods tend to be high in fat, sodium, and added sugar.  Shop around the outside edge of the grocery store. This includes fresh fruits and vegetables, bulk grains, fresh meats, and fresh dairy. Cooking  Use low-heat cooking methods, such as baking, instead of high-heat cooking methods like deep frying.  Cook using healthy oils, such as olive, canola, or sunflower oil.  Avoid cooking with butter, cream, or high-fat meats. Meal planning  Eat meals and snacks regularly, preferably at the same times every day. Avoid going long periods of time without eating.  Eat foods high in fiber, such as fresh fruits,  vegetables, beans, and whole grains. Talk to your dietitian about how many servings of carbs you can eat at each meal.  Eat 4-6 ounces (oz) of lean protein each day, such as lean meat, chicken, fish,  eggs, or tofu. One oz of lean protein is equal to: ? 1 oz of meat, chicken, or fish. ? 1 egg. ?  cup of tofu.  Eat some foods each day that contain healthy fats, such as avocado, nuts, seeds, and fish. Lifestyle  Check your blood glucose regularly.  Exercise regularly as told by your health care provider. This may include: ? 150 minutes of moderate-intensity or vigorous-intensity exercise each week. This could be brisk walking, biking, or water aerobics. ? Stretching and doing strength exercises, such as yoga or weightlifting, at least 2 times a week.  Take medicines as told by your health care provider.  Do not use any products that contain nicotine or tobacco, such as cigarettes and e-cigarettes. If you need help quitting, ask your health care provider.  Work with a Social worker or diabetes educator to identify strategies to manage stress and any emotional and social challenges. Questions to ask a health care provider  Do I need to meet with a diabetes educator?  Do I need to meet with a dietitian?  What number can I call if I have questions?  When are the best times to check my blood glucose? Where to find more information:  American Diabetes Association: diabetes.org  Academy of Nutrition and Dietetics: www.eatright.[employer] of Diabetes and Digestive and Kidney Diseases (NIH): [employer] Summary  A healthy meal plan will help you control your blood glucose and maintain a healthy lifestyle.  Working with a diet and nutrition specialist (dietitian) can help you make a meal plan that is best for you.  Keep in mind that carbohydrates (carbs) and alcohol have immediate effects on your blood glucose levels. It is important to count carbs and to use alcohol  carefully. This information is not intended to replace advice given to you by your health care provider. Make sure you discuss any questions you have with your health care provider. Document Released: 10/28/2004 Document Revised: 01/13/2017 Document Reviewed: 03/07/2016 Elsevier Patient Education  2020 [employer].

## 2018-11-20 NOTE — Assessment & Plan Note (Signed)
Well-controlled hypertension.  Continue present medications.  No changes. Blood work done today to recheck hemoglobin A1c and diabetes status.  Patient compliant with diet and medications.

## 2018-11-20 NOTE — Progress Notes (Signed)
BP Readings from Last 3 Encounters:  11/20/18 114/69  02/22/18 130/65  10/25/17 110/70   Lab Results  Component Value Date   HGBA1C 7.3 (H) 06/15/2018   Lab Results  Component Value Date   CHOL 131 06/15/2018   HDL 48 06/15/2018   LDLCALC 68 06/15/2018   TRIG 77 06/15/2018   CHOLHDL 2.7 06/15/2018   Lab Results  Component Value Date   CREATININE 1.06 06/15/2018   BUN 17 06/15/2018   NA 136 06/15/2018   K 5.1 06/15/2018   CL 101 06/15/2018   CO2 22 06/15/2018   Seth Pope 68 y.o.   Chief Complaint  Patient presents with  . Medication Refill    fenofibrate and nitrostat    HISTORY OF PRESENT ILLNESS: This is a 68 y.o. male with multiple chronic medical problems here for follow-up: 1.  Diabetes: On metformin and Tradjenta 5 mg daily.  Does not consistently measure blood glucose at home but when he does has noticed 126-146. 2.  Hypertension: Well-controlled.  On hydrochlorothiazide 12.5 mg, lisinopril 10 mg, and metoprolol 100 mg twice a day.  No complaints. 3.  Dyslipidemia: On atorvastatin 80 mg daily.  Used to take fenofibrate, discontinued by me due to interaction with statins. 4.  History of unstable angina: Has not had to use nitroglycerin sublingual in a long while however would like to have a refill of nitroglycerin just in case. Overall doing well.  Has no complaints or medical concerns today.  Needs blood work.  Fasting.  HPI   Prior to Admission medications   Medication Sig Start Date End Date Taking? Authorizing Provider  ALPRAZolam (XANAX XR) 1 MG 24 hr tablet Take 1 mg by mouth daily.   Yes [provider]  ALPRAZolam (XANAX) 0.25 MG tablet TK ONE T PO PRN D 03/16/16  Yes [provider]  aspirin 81 MG tablet Take 81 mg by mouth every other day.    Yes [provider]  atorvastatin (LIPITOR) 80 MG tablet Take 1 tablet (80 mg total) by mouth daily. 02/22/18  Yes Brodie Scovell, Ines Bloomer, MD  cyclobenzaprine (FLEXERIL) 5 MG tablet  Take 1 or 2 pills 3 times daily when needed for muscle relaxant. 03/08/17  Yes Weber, Sarah L, PA-C  desipramine (NORPRAMIN) 50 MG tablet Take 150 mg by mouth at bedtime.    Yes [provider]  hydrochlorothiazide (MICROZIDE) 12.5 MG capsule TAKE 1 CAPSULE(12.5 MG) BY MOUTH DAILY 08/19/18  Yes Reisa Coppola, Ines Bloomer, MD  HYDROcodone-acetaminophen (NORCO/VICODIN) 5-325 MG tablet Take 1 tablet by mouth every 6 (six) hours as needed for moderate pain. 03/08/17  Yes Weber, Sarah L, PA-C  ibuprofen (ADVIL,MOTRIN) 800 MG tablet Take 1 tablet (800 mg total) by mouth every 8 (eight) hours as needed. 03/08/17  Yes Weber, Sarah L, PA-C  linagliptin (TRADJENTA) 5 MG TABS tablet Take 1 tablet (5 mg total) by mouth daily. 06/28/18  Yes Kaya Klausing, Ines Bloomer, MD  lisinopril (ZESTRIL) 10 MG tablet TAKE 1 TABLET(10 MG) BY MOUTH DAILY 10/16/18  Yes Fritz Cauthon, Ines Bloomer, MD  metFORMIN (GLUCOPHAGE-XR) 500 MG 24 hr tablet TAKE 2 TABLETS BY MOUTH EVERY MORNING AND TAKE 1 TABLET BY MOUTH EVERY EVENING 08/19/18  Yes Lyllian Gause, Ines Bloomer, MD  metoprolol tartrate (LOPRESSOR) 100 MG tablet TAKE 1 TABLET(100 MG) BY MOUTH TWICE DAILY 08/19/18  Yes [place], Ines Bloomer, MD  ONETOUCH DELICA LANCETS FINE MISC USE AS DIRECTED TO CHECK BLOOD GLUCOSE UP TO THREE TIMES DAILY 12/14/15  Yes Reginia Forts  M, MD  ONETOUCH VERIO test strip USE TO CHECK BLOOD SUGAR UP TO THREE TIMES DAILY. 12/17/15  Yes Jeffery, Chelle, PA  traMADol (ULTRAM) 50 MG tablet Take 1 tablet (50 mg total) by mouth every 6 (six) hours as needed. 02/06/17  Yes Posey Boyer, MD  gabapentin (NEURONTIN) 300 MG capsule Take 2 capsules (600 mg total) by mouth 2 (two) times daily. 02/22/18 06/18/18  Horald Pollen, MD  ketoconazole (NIZORAL) 2 % shampoo Apply 1 application topically 2 (two) times a week. 09/09/15   [provider]  nitroGLYCERIN (NITROSTAT) 0.4 MG SL tablet Place one under the tongue as needed for chest pain. Repeat in 5 minutes if no effect.  Call 911 if you need to take the medication 11/20/18   Horald Pollen, MD    Allergies  Allergen Reactions  . Penicillins Rash    Has patient had a PCN reaction causing immediate rash, facial/tongue/throat swelling, SOB or lightheadedness with hypotension: Yes Has patient had a PCN reaction causing severe rash involving mucus membranes or skin necrosis: No Has patient had a PCN reaction that required hospitalization No Has patient had a PCN reaction occurring within the last 10 years: No If all of the above answers are "NO", then may proceed with Cephalosporin use.     Patient Active Problem List   Diagnosis Date Noted  . History of shingles 02/28/2017  . Post herpetic neuralgia 02/28/2017  . Family history of coronary artery disease in father 10/23/2013  . Tobacco abuse 10/17/2013  . Unstable angina ([place]) 10/04/2013  . PVC's (premature ventricular contractions) 10/04/2013  . Neuropathy, diabetic ([place]) 01/31/2012  . Hypertension   . DM2 (diabetes mellitus, type 2) ([place])   . Hyperlipidemia     Past Medical History:  Diagnosis Date  . Anxiety   . CAD (coronary artery disease)    a. cath 10/23/13: nonobstructive CAD, nl LV function, rec risk factor modification  . Chronic kidney disease    h/o kidney stone  . Diabetes mellitus    type 2  . History of pneumonia   . Hyperlipidemia   . Hypertension   . Sinus bradycardia    a. as low as 36 bpm; b. multiple pauses 2.1-2.3 seconds  . Ulcer   . Umbilical hernia     Past Surgical History:  Procedure Laterality Date  . BACK SURGERY    . COLONOSCOPY    . KNEE SURGERY Right 1970  . LEFT HEART CATHETERIZATION WITH CORONARY ANGIOGRAM N/A 10/23/2013   Procedure: LEFT HEART CATHETERIZATION WITH CORONARY ANGIOGRAM;  Surgeon: Peter M Martinique, MD;  Location: [facility] CATH LAB;  Service: Cardiovascular;  Laterality: N/A;  . MICROLARYNGOSCOPY N/A 09/23/2015   Procedure: MICROLARYNGOSCOPY REMOVAL OF LESION;  Surgeon: Izora Gala, MD;   Location: [place];  Service: ENT;  Laterality: N/A;  . NM [place]  08/2010   bruce myoview - normal perfusion in all regions, EF 66%, EKG negative for ischemia, no wall motion abnormalities, normal/low risk study                                       . ROTATOR CUFF REPAIR Bilateral 1991   x2  . UMBILICAL HERNIA REPAIR  1988    Social History   Socioeconomic History  . Marital status: Married    Spouse name: Abigail Butts  . Number of children: 2  . Years of  education: 10th   . Highest education level: Not on file  Occupational History  . Occupation: self-employed Research scientist (life sciences))  Social Needs  . Financial resource strain: Not on file  . Food insecurity    Worry: Not on file    Inability: Not on file  . Transportation needs    Medical: Not on file    Non-medical: Not on file  Tobacco Use  . Smoking status: Former Smoker    Packs/day: 2.50    Years: 18.00    Pack years: 45.00    Types: Cigarettes    Quit date: 07/16/1986    Years since quitting: 32.3  . Smokeless tobacco: Never Used  Substance and Sexual Activity  . Alcohol use: Yes    Comment: social  . Drug use: Yes    Types: Marijuana    Comment: 2-3 times a week  . Sexual activity: Yes  Lifestyle  . Physical activity    Days per week: Not on file    Minutes per session: Not on file  . Stress: Not on file  Relationships  . Social Herbalist on phone: Not on file    Gets together: Not on file    Attends religious service: Not on file    Active member of club or organization: Not on file    Attends meetings of clubs or organizations: Not on file    Relationship status: Not on file  . Intimate partner violence    Fear of current or ex partner: Not on file    Emotionally abused: Not on file    Physically abused: Not on file    Forced sexual activity: Not on file  Other Topics Concern  . Not on file  Social History Narrative   Lives at home w/ his wife   Right-handed   Daily caffeine     Family History  Problem Relation Age of Onset  . Heart attack Father   . Hypertension Father   . Heart disease Father   . Diabetes Sister        HTN  . Diabetes Brother        HTN, heart problems  . Hypertension Unknown   . Diabetes Paternal Grandmother   . Heart disease Paternal Grandfather      Review of Systems  Constitutional: Negative.  Negative for chills and fever.  HENT: Negative.  Negative for congestion and sore throat.   Respiratory: Negative.  Negative for cough and shortness of breath.   Cardiovascular: Negative.  Negative for chest pain and palpitations.  Gastrointestinal: Negative.  Negative for abdominal pain, blood in stool, diarrhea, nausea and vomiting.  Genitourinary: Negative.   Musculoskeletal: Negative.  Negative for myalgias.  Skin: Negative.  Negative for rash.  Neurological: Negative.  Negative for dizziness and headaches.  Endo/Heme/Allergies: Negative.   All other systems reviewed and are negative.  Today's Vitals   11/20/18 1027  BP: 114/69  Pulse: (!) 49  Resp: 16  Temp: 98 F (36.7 C)  TempSrc: Oral  SpO2: 98%  Weight: 183 lb 6.4 oz (83.2 kg)  Height: 5' 10.5" (1.791 m)   Body mass index is 25.94 kg/m.   Physical Exam Vitals signs reviewed.  Constitutional:      Appearance: Normal appearance.  HENT:     Head: Normocephalic.  Eyes:     Extraocular Movements: Extraocular movements intact.     Conjunctiva/sclera: Conjunctivae normal.     Pupils: Pupils are equal, round, and reactive to  light.  Neck:     Musculoskeletal: Normal range of motion and neck supple.  Cardiovascular:     Rate and Rhythm: Normal rate and regular rhythm.     Pulses: Normal pulses.     Heart sounds: Normal heart sounds.  Pulmonary:     Effort: Pulmonary effort is normal.     Breath sounds: Normal breath sounds.  Abdominal:     Palpations: Abdomen is soft.     Tenderness: There is no abdominal tenderness.  Musculoskeletal: Normal range of motion.   Skin:    General: Skin is warm and dry.     Capillary Refill: Capillary refill takes less than 2 seconds.  Neurological:     General: No focal deficit present.     Mental Status: He is alert and oriented to person, place, and time.  Psychiatric:        Mood and Affect: Mood normal.        Behavior: Behavior normal.      ASSESSMENT & PLAN: Hypertension associated with diabetes ([place]) Well-controlled hypertension.  Continue present medications.  No changes. Blood work done today to recheck hemoglobin A1c and diabetes status.  Patient compliant with diet and medications.  Dyslipidemia associated with type 2 diabetes mellitus ([facility]) Fasting labs done today.  Continue atorvastatin 80 mg daily.  Iaan was seen today for medication refill.  Diagnoses and all orders for this visit:  Hypertension associated with diabetes ([place]) -     Hemoglobin A1c -     Comprehensive metabolic panel  Need for prophylactic vaccination against Streptococcus pneumoniae (pneumococcus) -     Pneumococcal polysaccharide vaccine 23-valent greater than or equal to 2yo subcutaneous/IM  Need for prophylactic vaccination and inoculation against influenza -     Flu Vaccine QUAD High Dose(Fluad)  Dyslipidemia associated with type 2 diabetes mellitus ([facility]) -     Lipid panel  Essential hypertension -     lisinopril (ZESTRIL) 10 MG tablet; Take 1 tablet (10 mg total) by mouth daily.  Other orders -     nitroGLYCERIN (NITROSTAT) 0.4 MG SL tablet; Place one under the tongue as needed for chest pain. Repeat in 5 minutes if no effect. Call 911 if you need to take the medication    Patient Instructions       If you have lab work done today you will be contacted with your lab results within the next 2 weeks.  If you have not heard from [place] then please contact us. The fastest way to get your results is to register for My Chart.   IF you received an x-Garo today, you will receive an invoice from [facility]  Radiology. Please contact [facility] at [phone] with questions or concerns regarding your invoice.   IF you received labwork today, you will receive an invoice from [facility]. Please contact [facility] at [phone] with questions or concerns regarding your invoice.   Our billing staff will not be able to assist you with questions regarding bills from these companies.  You will be contacted with the lab results as soon as they are available. The fastest way to get your results is to activate your My Chart account. Instructions are located on the last page of this paperwork. If you have not heard from [place] regarding the results in 2 weeks, please contact this office.     Hypertension, Adult High blood pressure (hypertension) is when the force of blood pumping through the arteries is too strong. The arteries are the blood  vessels that carry blood from the heart throughout the body. Hypertension forces the heart to work harder to pump blood and may cause arteries to become narrow or stiff. Untreated or uncontrolled hypertension can cause a heart attack, heart failure, a stroke, kidney disease, and other problems. A blood pressure reading consists of a higher number over a lower number. Ideally, your blood pressure should be below 120/80. The first ("top") number is called the systolic pressure. It is a measure of the pressure in your arteries as your heart beats. The second ("bottom") number is called the diastolic pressure. It is a measure of the pressure in your arteries as the heart relaxes. What are the causes? The exact cause of this condition is not known. There are some conditions that result in or are related to high blood pressure. What increases the risk? Some risk factors for high blood pressure are under your control. The following factors may make you more likely to develop this condition:  Smoking.  Having type 2 diabetes mellitus, high cholesterol, or both.  Not getting  enough exercise or physical activity.  Being overweight.  Having too much fat, sugar, calories, or salt (sodium) in your diet.  Drinking too much alcohol. Some risk factors for high blood pressure may be difficult or impossible to change. Some of these factors include:  Having chronic kidney disease.  Having a family history of high blood pressure.  Age. Risk increases with age.  Race. You may be at higher risk if you are African American.  Gender. Men are at higher risk than women before age 41. After age 53, women are at higher risk than men.  Having obstructive sleep apnea.  Stress. What are the signs or symptoms? High blood pressure may not cause symptoms. Very high blood pressure (hypertensive crisis) may cause:  Headache.  Anxiety.  Shortness of breath.  Nosebleed.  Nausea and vomiting.  Vision changes.  Severe chest pain.  Seizures. How is this diagnosed? This condition is diagnosed by measuring your blood pressure while you are seated, with your arm resting on a flat surface, your legs uncrossed, and your feet flat on the floor. The cuff of the blood pressure monitor will be placed directly against the skin of your upper arm at the level of your heart. It should be measured at least twice using the same arm. Certain conditions can cause a difference in blood pressure between your right and left arms. Certain factors can cause blood pressure readings to be lower or higher than normal for a short period of time:  When your blood pressure is higher when you are in a health care provider's office than when you are at home, this is called white coat hypertension. Most people with this condition do not need medicines.  When your blood pressure is higher at home than when you are in a health care provider's office, this is called masked hypertension. Most people with this condition may need medicines to control blood pressure. If you have a high blood pressure reading  during one visit or you have normal blood pressure with other risk factors, you may be asked to:  Return on a different day to have your blood pressure checked again.  Monitor your blood pressure at home for 1 week or longer. If you are diagnosed with hypertension, you may have other blood or imaging tests to help your health care provider understand your overall risk for other conditions. How is this treated? This condition is treated  by making healthy lifestyle changes, such as eating healthy foods, exercising more, and reducing your alcohol intake. Your health care provider may prescribe medicine if lifestyle changes are not enough to get your blood pressure under control, and if:  Your systolic blood pressure is above 130.  Your diastolic blood pressure is above 80. Your personal target blood pressure may vary depending on your medical conditions, your age, and other factors. Follow these instructions at home: Eating and drinking   Eat a diet that is high in fiber and potassium, and low in sodium, added sugar, and fat. An example eating plan is called the DASH (Dietary Approaches to Stop Hypertension) diet. To eat this way: ? Eat plenty of fresh fruits and vegetables. Try to fill one half of your plate at each meal with fruits and vegetables. ? Eat whole grains, such as whole-wheat pasta, brown rice, or whole-grain bread. Fill about one fourth of your plate with whole grains. ? Eat or drink low-fat dairy products, such as skim milk or low-fat yogurt. ? Avoid fatty cuts of meat, processed or cured meats, and poultry with skin. Fill about one fourth of your plate with lean proteins, such as fish, chicken without skin, beans, eggs, or tofu. ? Avoid pre-made and processed foods. These tend to be higher in sodium, added sugar, and fat.  Reduce your daily sodium intake. Most people with hypertension should eat less than 1,500 mg of sodium a day.  Do not drink alcohol if: ? Your health care  provider tells you not to drink. ? You are pregnant, may be pregnant, or are planning to become pregnant.  If you drink alcohol: ? Limit how much you use to:  0-1 drink a day for women.  0-2 drinks a day for men. ? Be aware of how much alcohol is in your drink. In the [place] one drink equals one 12 oz bottle of beer (355 mL), one 5 oz glass of wine (148 mL), or one 1 oz glass of hard liquor (44 mL). Lifestyle   Work with your health care provider to maintain a healthy body weight or to lose weight. Ask what an ideal weight is for you.  Get at least 30 minutes of exercise most days of the week. Activities may include walking, swimming, or biking.  Include exercise to strengthen your muscles (resistance exercise), such as Pilates or lifting weights, as part of your weekly exercise routine. Try to do these types of exercises for 30 minutes at least 3 days a week.  Do not use any products that contain nicotine or tobacco, such as cigarettes, e-cigarettes, and chewing tobacco. If you need help quitting, ask your health care provider.  Monitor your blood pressure at home as told by your health care provider.  Keep all follow-up visits as told by your health care provider. This is important. Medicines  Take over-the-counter and prescription medicines only as told by your health care provider. Follow directions carefully. Blood pressure medicines must be taken as prescribed.  Do not skip doses of blood pressure medicine. Doing this puts you at risk for problems and can make the medicine less effective.  Ask your health care provider about side effects or reactions to medicines that you should watch for. Contact a health care provider if you:  Think you are having a reaction to a medicine you are taking.  Have headaches that keep coming back (recurring).  Feel dizzy.  Have swelling in your ankles.  Have trouble with your  vision. Get help right away if you:  Develop a severe  headache or confusion.  Have unusual weakness or numbness.  Feel faint.  Have severe pain in your chest or abdomen.  Vomit repeatedly.  Have trouble breathing. Summary  Hypertension is when the force of blood pumping through your arteries is too strong. If this condition is not controlled, it may put you at risk for serious complications.  Your personal target blood pressure may vary depending on your medical conditions, your age, and other factors. For most people, a normal blood pressure is less than 120/80.  Hypertension is treated with lifestyle changes, medicines, or a combination of both. Lifestyle changes include losing weight, eating a healthy, low-sodium diet, exercising more, and limiting alcohol. This information is not intended to replace advice given to you by your health care provider. Make sure you discuss any questions you have with your health care provider. Document Released: 01/31/2005 Document Revised: 10/11/2017 Document Reviewed: 10/11/2017 Elsevier Patient Education  [place].  Diabetes Mellitus and Nutrition, Adult When you have diabetes (diabetes mellitus), it is very important to have healthy eating habits because your blood sugar (glucose) levels are greatly affected by what you eat and drink. Eating healthy foods in the appropriate amounts, at about the same times every day, can help you:  Control your blood glucose.  Lower your risk of heart disease.  Improve your blood pressure.  Reach or maintain a healthy weight. Every person with diabetes is different, and each person has different needs for a meal plan. Your health care provider may recommend that you work with a diet and nutrition specialist (dietitian) to make a meal plan that is best for you. Your meal plan may vary depending on factors such as:  The calories you need.  The medicines you take.  Your weight.  Your blood glucose, blood pressure, and cholesterol levels.  Your  activity level.  Other health conditions you have, such as heart or kidney disease. How do carbohydrates affect me? Carbohydrates, also called carbs, affect your blood glucose level more than any other type of food. Eating carbs naturally raises the amount of glucose in your blood. Carb counting is a method for keeping track of how many carbs you eat. Counting carbs is important to keep your blood glucose at a healthy level, especially if you use insulin or take certain oral diabetes medicines. It is important to know how many carbs you can safely have in each meal. This is different for every person. Your dietitian can help you calculate how many carbs you should have at each meal and for each snack. Foods that contain carbs include:  Bread, cereal, rice, pasta, and crackers.  Potatoes and corn.  Peas, beans, and lentils.  Milk and yogurt.  Fruit and juice.  Desserts, such as cakes, cookies, ice cream, and candy. How does alcohol affect me? Alcohol can cause a sudden decrease in blood glucose (hypoglycemia), especially if you use insulin or take certain oral diabetes medicines. Hypoglycemia can be a life-threatening condition. Symptoms of hypoglycemia (sleepiness, dizziness, and confusion) are similar to symptoms of having too much alcohol. If your health care provider says that alcohol is safe for you, follow these guidelines:  Limit alcohol intake to no more than 1 drink per day for nonpregnant women and 2 drinks per day for men. One drink equals 12 oz of beer, 5 oz of wine, or 1 oz of hard liquor.  Do not drink on an empty stomach.  Keep yourself hydrated with water, diet soda, or unsweetened iced tea.  Keep in mind that regular soda, juice, and other mixers may contain a lot of sugar and must be counted as carbs. What are tips for following this plan?  Reading food labels  Start by checking the serving size on the "Nutrition Facts" label of packaged foods and drinks. The  amount of calories, carbs, fats, and other nutrients listed on the label is based on one serving of the item. Many items contain more than one serving per package.  Check the total grams (g) of carbs in one serving. You can calculate the number of servings of carbs in one serving by dividing the total carbs by 15. For example, if a food has 30 g of total carbs, it would be equal to 2 servings of carbs.  Check the number of grams (g) of saturated and trans fats in one serving. Choose foods that have low or no amount of these fats.  Check the number of milligrams (mg) of salt (sodium) in one serving. Most people should limit total sodium intake to less than 2,300 mg per day.  Always check the nutrition information of foods labeled as "low-fat" or "nonfat". These foods may be higher in added sugar or refined carbs and should be avoided.  Talk to your dietitian to identify your daily goals for nutrients listed on the label. Shopping  Avoid buying canned, premade, or processed foods. These foods tend to be high in fat, sodium, and added sugar.  Shop around the outside edge of the grocery store. This includes fresh fruits and vegetables, bulk grains, fresh meats, and fresh dairy. Cooking  Use low-heat cooking methods, such as baking, instead of high-heat cooking methods like deep frying.  Cook using healthy oils, such as olive, canola, or sunflower oil.  Avoid cooking with butter, cream, or high-fat meats. Meal planning  Eat meals and snacks regularly, preferably at the same times every day. Avoid going long periods of time without eating.  Eat foods high in fiber, such as fresh fruits, vegetables, beans, and whole grains. Talk to your dietitian about how many servings of carbs you can eat at each meal.  Eat 4-6 ounces (oz) of lean protein each day, such as lean meat, chicken, fish, eggs, or tofu. One oz of lean protein is equal to: ? 1 oz of meat, chicken, or fish. ? 1 egg. ?  cup of  tofu.  Eat some foods each day that contain healthy fats, such as avocado, nuts, seeds, and fish. Lifestyle  Check your blood glucose regularly.  Exercise regularly as told by your health care provider. This may include: ? 150 minutes of moderate-intensity or vigorous-intensity exercise each week. This could be brisk walking, biking, or water aerobics. ? Stretching and doing strength exercises, such as yoga or weightlifting, at least 2 times a week.  Take medicines as told by your health care provider.  Do not use any products that contain nicotine or tobacco, such as cigarettes and e-cigarettes. If you need help quitting, ask your health care provider.  Work with a Social worker or diabetes educator to identify strategies to manage stress and any emotional and social challenges. Questions to ask a health care provider  Do I need to meet with a diabetes educator?  Do I need to meet with a dietitian?  What number can I call if I have questions?  When are the best times to check my blood glucose? Where to find more  information:  American Diabetes Association: diabetes.org  Academy of Nutrition and Dietetics: www.eatright.[employer] of Diabetes and Digestive and Kidney Diseases (NIH): [employer] Summary  A healthy meal plan will help you control your blood glucose and maintain a healthy lifestyle.  Working with a diet and nutrition specialist (dietitian) can help you make a meal plan that is best for you.  Keep in mind that carbohydrates (carbs) and alcohol have immediate effects on your blood glucose levels. It is important to count carbs and to use alcohol carefully. This information is not intended to replace advice given to you by your health care provider. Make sure you discuss any questions you have with your health care provider. Document Released: 10/28/2004 Document Revised: 01/13/2017 Document Reviewed: 03/07/2016 Elsevier Patient Education  2020  Elsevier Inc.      Agustina Caroli, MD [facility]

## 2018-11-20 NOTE — Assessment & Plan Note (Signed)
Fasting labs done today.  Continue atorvastatin 80 mg daily.

## 2018-11-21 ENCOUNTER — Encounter: Payer: Self-pay | Admitting: Emergency Medicine

## 2018-11-21 LAB — COMPREHENSIVE METABOLIC PANEL
ALT: 16 IU/L (ref 0–44)
AST: 18 IU/L (ref 0–40)
Albumin/Globulin Ratio: 2.1 (ref 1.2–2.2)
Albumin: 4.6 g/dL (ref 3.8–4.8)
Alkaline Phosphatase: 84 IU/L (ref 39–117)
BUN/Creatinine Ratio: 14 (ref 10–24)
BUN: 14 mg/dL (ref 8–27)
Bilirubin Total: 0.4 mg/dL (ref 0.0–1.2)
CO2: 22 mmol/L (ref 20–29)
Calcium: 9.4 mg/dL (ref 8.6–10.2)
Chloride: 102 mmol/L (ref 96–106)
Creatinine, Ser: 1.01 mg/dL (ref 0.76–1.27)
GFR calc Af Amer: 89 mL/min/{1.73_m2} (ref >59)
GFR calc non Af Amer: 77 mL/min/{1.73_m2} (ref >59)
Globulin, Total: 2.2 g/dL (ref 1.5–4.5)
Glucose: 127 mg/dL — ABNORMAL HIGH (ref 65–99)
Potassium: 4.6 mmol/L (ref 3.5–5.2)
Sodium: 138 mmol/L (ref 134–144)
Total Protein: 6.8 g/dL (ref 6.0–8.5)

## 2018-11-21 LAB — HEMOGLOBIN A1C
Est. average glucose Bld gHb Est-mCnc: 148 mg/dL
Hgb A1c MFr Bld: 6.8 % — ABNORMAL HIGH (ref 4.8–5.6)

## 2018-11-21 LAB — LIPID PANEL
Chol/HDL Ratio: 2.6 ratio (ref 0.0–5.0)
Cholesterol, Total: 127 mg/dL (ref 100–199)
HDL: 49 mg/dL (ref >39)
LDL Chol Calc (NIH): 63 mg/dL (ref 0–99)
Triglycerides: 77 mg/dL (ref 0–149)
VLDL Cholesterol Cal: 15 mg/dL (ref 5–40)

## 2018-12-19 ENCOUNTER — Other Ambulatory Visit: Payer: Self-pay | Admitting: Emergency Medicine

## 2018-12-19 DIAGNOSIS — I10 Essential (primary) hypertension: Secondary | ICD-10-CM

## 2018-12-19 DIAGNOSIS — E119 Type 2 diabetes mellitus without complications: Secondary | ICD-10-CM

## 2018-12-20 ENCOUNTER — Other Ambulatory Visit: Payer: Self-pay | Admitting: Emergency Medicine

## 2019-02-05 LAB — HM DIABETES EYE EXAM

## 2019-02-13 ENCOUNTER — Telehealth: Payer: Self-pay | Admitting: *Deleted

## 2019-02-13 ENCOUNTER — Encounter: Payer: Self-pay | Admitting: *Deleted

## 2019-02-13 NOTE — Telephone Encounter (Signed)
error 

## 2019-04-18 ENCOUNTER — Other Ambulatory Visit: Payer: Self-pay | Admitting: Emergency Medicine

## 2019-04-18 DIAGNOSIS — E7800 Pure hypercholesterolemia, unspecified: Secondary | ICD-10-CM

## 2019-04-18 MED ORDER — ATORVASTATIN CALCIUM 80 MG PO TABS: 80.0000 mg | ORAL_TABLET | Freq: Every day | ORAL | 1 refills | Status: DC

## 2019-05-21 ENCOUNTER — Ambulatory Visit: Payer: 59 | Admitting: Emergency Medicine

## 2019-06-04 ENCOUNTER — Other Ambulatory Visit: Payer: Self-pay

## 2019-06-04 ENCOUNTER — Ambulatory Visit: Payer: 59 | Admitting: Emergency Medicine

## 2019-06-04 ENCOUNTER — Encounter: Payer: Self-pay | Admitting: Emergency Medicine

## 2019-06-04 VITALS — BP 138/77 | HR 61 | Temp 97.2°F | Resp 16 | Ht 70.0 in | Wt 184.0 lb

## 2019-06-04 DIAGNOSIS — G8929 Other chronic pain: Secondary | ICD-10-CM

## 2019-06-04 DIAGNOSIS — E785 Hyperlipidemia, unspecified: Secondary | ICD-10-CM

## 2019-06-04 DIAGNOSIS — E1165 Type 2 diabetes mellitus with hyperglycemia: Secondary | ICD-10-CM

## 2019-06-04 DIAGNOSIS — E1159 Type 2 diabetes mellitus with other circulatory complications: Secondary | ICD-10-CM | POA: Diagnosis not present

## 2019-06-04 DIAGNOSIS — I152 Hypertension secondary to endocrine disorders: Secondary | ICD-10-CM

## 2019-06-04 DIAGNOSIS — R1011 Right upper quadrant pain: Secondary | ICD-10-CM

## 2019-06-04 DIAGNOSIS — I10 Essential (primary) hypertension: Secondary | ICD-10-CM

## 2019-06-04 DIAGNOSIS — E1169 Type 2 diabetes mellitus with other specified complication: Secondary | ICD-10-CM

## 2019-06-04 DIAGNOSIS — E7800 Pure hypercholesterolemia, unspecified: Secondary | ICD-10-CM

## 2019-06-04 LAB — POCT GLYCOSYLATED HEMOGLOBIN (HGB A1C): Hemoglobin A1C: 7.3 % — AB (ref 4.0–5.6)

## 2019-06-04 LAB — GLUCOSE, POCT (MANUAL RESULT ENTRY): POC Glucose: 193 mg/dl — AB (ref 70–99)

## 2019-06-04 MED ORDER — DAPAGLIFLOZIN PROPANEDIOL 5 MG PO TABS: 5.0000 mg | ORAL_TABLET | Freq: Every day | ORAL | 1 refills | Status: DC

## 2019-06-04 MED ORDER — METFORMIN HCL ER 500 MG PO TB24: 1000.0000 mg | ORAL_TABLET | Freq: Two times a day (BID) | ORAL | 1 refills | Status: DC

## 2019-06-04 MED ORDER — ATORVASTATIN CALCIUM 80 MG PO TABS: 80.0000 mg | ORAL_TABLET | Freq: Every day | ORAL | 1 refills | Status: DC

## 2019-06-04 MED ORDER — KETOCONAZOLE 2 % EX SHAM: 1.0000 "application " | MEDICATED_SHAMPOO | CUTANEOUS | 2 refills | Status: DC

## 2019-06-04 NOTE — Patient Instructions (Addendum)
   If you have lab work done today you will be contacted with your lab results within the next 2 weeks.  If you have not heard from us then please contact us. The fastest way to get your results is to register for My Chart.   IF you received an x-Salim today, you will receive an invoice from [facility]. Please contact  Radiology at [phone] with questions or concerns regarding your invoice.   IF you received labwork today, you will receive an invoice from [facility]. Please contact [facility] at [phone] with questions or concerns regarding your invoice.   Our billing staff will not be able to assist you with questions regarding bills from these companies.  You will be contacted with the lab results as soon as they are available. The fastest way to get your results is to activate your My Chart account. Instructions are located on the last page of this paperwork. If you have not heard from us regarding the results in 2 weeks, please contact this office.     Diabetes Mellitus and Nutrition, Adult When you have diabetes (diabetes mellitus), it is very important to have healthy eating habits because your blood sugar (glucose) levels are greatly affected by what you eat and drink. Eating healthy foods in the appropriate amounts, at about the same times every day, can help you:  Control your blood glucose.  Lower your risk of heart disease.  Improve your blood pressure.  Reach or maintain a healthy weight. Every person with diabetes is different, and each person has different needs for a meal plan. Your health care provider may recommend that you work with a diet and nutrition specialist (dietitian) to make a meal plan that is best for you. Your meal plan may vary depending on factors such as:  The calories you need.  The medicines you take.  Your weight.  Your blood glucose, blood pressure, and cholesterol levels.  Your activity level.  Other health conditions  you have, such as heart or kidney disease. How do carbohydrates affect me? Carbohydrates, also called carbs, affect your blood glucose level more than any other type of food. Eating carbs naturally raises the amount of glucose in your blood. Carb counting is a method for keeping track of how many carbs you eat. Counting carbs is important to keep your blood glucose at a healthy level, especially if you use insulin or take certain oral diabetes medicines. It is important to know how many carbs you can safely have in each meal. This is different for every person. Your dietitian can help you calculate how many carbs you should have at each meal and for each snack. Foods that contain carbs include:  Bread, cereal, rice, pasta, and crackers.  Potatoes and corn.  Peas, beans, and lentils.  Milk and yogurt.  Fruit and juice.  Desserts, such as cakes, cookies, ice cream, and candy. How does alcohol affect me? Alcohol can cause a sudden decrease in blood glucose (hypoglycemia), especially if you use insulin or take certain oral diabetes medicines. Hypoglycemia can be a life-threatening condition. Symptoms of hypoglycemia (sleepiness, dizziness, and confusion) are similar to symptoms of having too much alcohol. If your health care provider says that alcohol is safe for you, follow these guidelines:  Limit alcohol intake to no more than 1 drink per day for nonpregnant women and 2 drinks per day for men. One drink equals 12 oz of beer, 5 oz of wine, or 1 oz of hard liquor.    Do not drink on an empty stomach.  Keep yourself hydrated with water, diet soda, or unsweetened iced tea.  Keep in mind that regular soda, juice, and other mixers may contain a lot of sugar and must be counted as carbs. What are tips for following this plan?  Reading food labels  Start by checking the serving size on the "Nutrition Facts" label of packaged foods and drinks. The amount of calories, carbs, fats, and other  nutrients listed on the label is based on one serving of the item. Many items contain more than one serving per package.  Check the total grams (g) of carbs in one serving. You can calculate the number of servings of carbs in one serving by dividing the total carbs by 15. For example, if a food has 30 g of total carbs, it would be equal to 2 servings of carbs.  Check the number of grams (g) of saturated and trans fats in one serving. Choose foods that have low or no amount of these fats.  Check the number of milligrams (mg) of salt (sodium) in one serving. Most people should limit total sodium intake to less than 2,300 mg per day.  Always check the nutrition information of foods labeled as "low-fat" or "nonfat". These foods may be higher in added sugar or refined carbs and should be avoided.  Talk to your dietitian to identify your daily goals for nutrients listed on the label. Shopping  Avoid buying canned, premade, or processed foods. These foods tend to be high in fat, sodium, and added sugar.  Shop around the outside edge of the grocery store. This includes fresh fruits and vegetables, bulk grains, fresh meats, and fresh dairy. Cooking  Use low-heat cooking methods, such as baking, instead of high-heat cooking methods like deep frying.  Cook using healthy oils, such as olive, canola, or sunflower oil.  Avoid cooking with butter, cream, or high-fat meats. Meal planning  Eat meals and snacks regularly, preferably at the same times every day. Avoid going long periods of time without eating.  Eat foods high in fiber, such as fresh fruits, vegetables, beans, and whole grains. Talk to your dietitian about how many servings of carbs you can eat at each meal.  Eat 4-6 ounces (oz) of lean protein each day, such as lean meat, chicken, fish, eggs, or tofu. One oz of lean protein is equal to: ? 1 oz of meat, chicken, or fish. ? 1 egg. ?  cup of tofu.  Eat some foods each day that contain  healthy fats, such as avocado, nuts, seeds, and fish. Lifestyle  Check your blood glucose regularly.  Exercise regularly as told by your health care provider. This may include: ? 150 minutes of moderate-intensity or vigorous-intensity exercise each week. This could be brisk walking, biking, or water aerobics. ? Stretching and doing strength exercises, such as yoga or weightlifting, at least 2 times a week.  Take medicines as told by your health care provider.  Do not use any products that contain nicotine or tobacco, such as cigarettes and e-cigarettes. If you need help quitting, ask your health care provider.  Work with a counselor or diabetes educator to identify strategies to manage stress and any emotional and social challenges. Questions to ask a health care provider  Do I need to meet with a diabetes educator?  Do I need to meet with a dietitian?  What number can I call if I have questions?  When are the best times to   times to check my blood glucose? Where to find more information:  American Diabetes Association: diabetes.org  Academy of Nutrition and Dietetics: www.eatright.org  National Institute of Diabetes and Digestive and Kidney Diseases (NIH): www.niddk.nih.gov Summary  A healthy meal plan will help you control your blood glucose and maintain a healthy lifestyle.  Working with a diet and nutrition specialist (dietitian) can help you make a meal plan that is best for you.  Keep in mind that carbohydrates (carbs) and alcohol have immediate effects on your blood glucose levels. It is important to count carbs and to use alcohol carefully. This information is not intended to replace advice given to you by your health care provider. Make sure you discuss any questions you have with your health care provider. Document Revised: 01/13/2017 Document Reviewed: 03/07/2016 Elsevier Patient Education  2020 Elsevier Inc.  

## 2019-06-04 NOTE — Progress Notes (Signed)
Seth Pope 69 y.o.   Chief Complaint  Patient presents with  . Diabetes    follow up 6 months  . Medication Refill    Metformin    HISTORY OF PRESENT ILLNESS: This is a 69 y.o. male with multiple chronic medical problems here for follow-up: 1.  Diabetes: On metformin and Tradjenta 5 mg daily.  Does not consistently measure blood glucose at home but when he does has noticed 126-146. 2.  Hypertension: Well-controlled.  On hydrochlorothiazide 12.5 mg, lisinopril 10 mg, and metoprolol 100 mg twice a day.  No complaints. 3.  Dyslipidemia: On atorvastatin 80 mg daily.  Used to take fenofibrate, discontinued by me due to interaction with statins. 4.  History of unstable angina: Has not had to use nitroglycerin sublingual in a long while. Overall doing well.  Today complaining of chronic intermittent discomfort to the right upper quadrant abdominal area.  No associated symptoms.  No weight loss.  Able to eat and drink okay.  Denies nausea or vomiting. Lab Results  Component Value Date   HGBA1C 6.8 (H) 11/20/2018   BP Readings from Last 3 Encounters:  06/04/19 138/77  11/20/18 114/69  02/22/18 130/65   Lab Results  Component Value Date   CHOL 127 11/20/2018   HDL 49 11/20/2018   LDLCALC 63 11/20/2018   TRIG 77 11/20/2018   CHOLHDL 2.6 11/20/2018   Lab Results  Component Value Date   CREATININE 1.01 11/20/2018   BUN 14 11/20/2018   NA 138 11/20/2018   K 4.6 11/20/2018   CL 102 11/20/2018   CO2 22 11/20/2018  Last GFR: 89  HPI   Prior to Admission medications   Medication Sig Start Date End Date Taking? Authorizing Provider  ALPRAZolam (XANAX XR) 1 MG 24 hr tablet Take 1 mg by mouth daily.   Yes [provider]  ALPRAZolam (XANAX) 0.25 MG tablet TK ONE T PO PRN D 03/16/16  Yes [provider]  aspirin 81 MG tablet Take 81 mg by mouth every other day.    Yes [provider]  atorvastatin (LIPITOR) 80 MG tablet Take 1 tablet (80 mg total) by mouth  daily. 04/18/19  Yes Zita Ozimek, Ines Bloomer, MD  desipramine (NORPRAMIN) 50 MG tablet Take 150 mg by mouth at bedtime.    Yes [provider]  hydrochlorothiazide (MICROZIDE) 12.5 MG capsule TAKE 1 CAPSULE(12.5 MG) BY MOUTH DAILY 12/19/18  Yes Keithan Dileonardo, Ines Bloomer, MD  HYDROcodone-acetaminophen (NORCO/VICODIN) 5-325 MG tablet Take 1 tablet by mouth every 6 (six) hours as needed for moderate pain. 03/08/17  Yes Weber, Sarah L, PA-C  ibuprofen (ADVIL,MOTRIN) 800 MG tablet Take 1 tablet (800 mg total) by mouth every 8 (eight) hours as needed. 03/08/17  Yes Weber, Sarah L, PA-C  ketoconazole (NIZORAL) 2 % shampoo Apply 1 application topically 2 (two) times a week. 09/09/15  Yes [provider]  metFORMIN (GLUCOPHAGE-XR) 500 MG 24 hr tablet TAKE 2 TABLETS BY MOUTH EVERY MORNING AND TAKE 1 TABLET BY MOUTH EVERY EVENING 12/19/18  Yes Shayleen Eppinger, Ines Bloomer, MD  metoprolol tartrate (LOPRESSOR) 100 MG tablet TAKE 1 TABLET(100 MG) BY MOUTH TWICE DAILY 12/19/18  Yes Apolo Cutshaw, Ines Bloomer, MD  nitroGLYCERIN (NITROSTAT) 0.4 MG SL tablet Place one under the tongue as needed for chest pain. Repeat in 5 minutes if no effect. Call 911 if you need to take the medication 11/20/18  Yes Duran Ohern, Ines Bloomer, MD  TRADJENTA 5 MG TABS tablet TAKE 1 TABLET(5 MG) BY MOUTH DAILY  12/20/18  Yes Solyana Nonaka, Ines Bloomer, MD  traMADol (ULTRAM) 50 MG tablet Take 1 tablet (50 mg total) by mouth every 6 (six) hours as needed. 02/06/17  Yes Posey Boyer, MD  cyclobenzaprine (FLEXERIL) 5 MG tablet Take 1 or 2 pills 3 times daily when needed for muscle relaxant. Patient not taking: Reported on 06/04/2019 03/08/17   Mancel Bale, PA-C  gabapentin (NEURONTIN) 300 MG capsule Take 2 capsules (600 mg total) by mouth 2 (two) times daily. 02/22/18 06/18/18  Horald Pollen, MD  lisinopril (ZESTRIL) 10 MG tablet Take 1 tablet (10 mg total) by mouth daily. 11/20/18 02/18/19  Horald Pollen, MD  [facility] DELICA LANCETS FINE MISC USE  AS DIRECTED TO CHECK BLOOD GLUCOSE UP TO THREE TIMES DAILY 12/14/15   Wardell Honour, MD  [facility] VERIO test strip USE TO CHECK BLOOD SUGAR UP TO THREE TIMES DAILY. 12/17/15   Harrison Mons, PA    Allergies  Allergen Reactions  . Penicillins Rash    Has patient had a PCN reaction causing immediate rash, facial/tongue/throat swelling, SOB or lightheadedness with hypotension: Yes Has patient had a PCN reaction causing severe rash involving mucus membranes or skin necrosis: No Has patient had a PCN reaction that required hospitalization No Has patient had a PCN reaction occurring within the last 10 years: No If all of the above answers are "NO", then may proceed with Cephalosporin use.     Patient Active Problem List   Diagnosis Date Noted  . History of shingles 02/28/2017  . Post herpetic neuralgia 02/28/2017  . Family history of coronary artery disease in father 10/23/2013  . Tobacco abuse 10/17/2013  . Unstable angina ([place]) 10/04/2013  . PVC's (premature ventricular contractions) 10/04/2013  . Neuropathy, diabetic ([place]) 01/31/2012  . Hypertension associated with diabetes ([place])   . Dyslipidemia associated with type 2 diabetes mellitus ([place])   . Hyperlipidemia     Past Medical History:  Diagnosis Date  . Anxiety   . CAD (coronary artery disease)    a. cath 10/23/13: nonobstructive CAD, nl LV function, rec risk factor modification  . Chronic kidney disease    h/o kidney stone  . Diabetes mellitus    type 2  . History of pneumonia   . Hyperlipidemia   . Hypertension   . Sinus bradycardia    a. as low as 36 bpm; b. multiple pauses 2.1-2.3 seconds  . Ulcer   . Umbilical hernia     Past Surgical History:  Procedure Laterality Date  . BACK SURGERY    . COLONOSCOPY    . KNEE SURGERY Right 1970  . LEFT HEART CATHETERIZATION WITH CORONARY ANGIOGRAM N/A 10/23/2013   Procedure: LEFT HEART CATHETERIZATION WITH CORONARY ANGIOGRAM;  Surgeon: Peter M Martinique, MD;  Location: [facility] CATH  LAB;  Service: Cardiovascular;  Laterality: N/A;  . MICROLARYNGOSCOPY N/A 09/23/2015   Procedure: MICROLARYNGOSCOPY REMOVAL OF LESION;  Surgeon: Izora Gala, MD;  Location: [facility];  Service: ENT;  Laterality: N/A;  . NM [place]  08/2010   bruce myoview - normal perfusion in all regions, EF 66%, EKG negative for ischemia, no wall motion abnormalities, normal/low risk study                                       . ROTATOR CUFF REPAIR Bilateral 1991   x2  . [place]  Social History   Socioeconomic History  . Marital status: Married    Spouse name: Abigail Butts  . Number of children: 2  . Years of education: 10th   . Highest education level: Not on file  Occupational History  . Occupation: self-employed (Investment banker, operational)  Tobacco Use  . Smoking status: Former Smoker    Packs/day: 2.50    Years: 18.00    Pack years: 45.00    Types: Cigarettes    Quit date: 07/16/1986    Years since quitting: 32.9  . Smokeless tobacco: Never Used  Substance and Sexual Activity  . Alcohol use: Yes    Comment: social  . Drug use: Yes    Types: Marijuana    Comment: 2-3 times a week  . Sexual activity: Yes  Other Topics Concern  . Not on file  Social History Narrative   Lives at home w/ his wife   Right-handed   Daily caffeine   Social Determinants of Health   Financial Resource Strain:   . Difficulty of Paying Living Expenses:   Food Insecurity:   . Worried About Charity fundraiser in the Last Year:   . Arboriculturist in the Last Year:   Transportation Needs:   . Film/video editor (Medical):   Marland Kitchen Lack of Transportation (Non-Medical):   Physical Activity:   . Days of Exercise per Week:   . Minutes of Exercise per Session:   Stress:   . Feeling of Stress :   Social Connections:   . Frequency of Communication with Friends and Family:   . Frequency of Social Gatherings with Friends and Family:   . Attends Religious Services:   . Active Member of  Clubs or Organizations:   . Attends Archivist Meetings:   Marland Kitchen Marital Status:   Intimate Partner Violence:   . Fear of Current or Ex-Partner:   . Emotionally Abused:   Marland Kitchen Physically Abused:   . Sexually Abused:     Family History  Problem Relation Age of Onset  . Heart attack Father   . Hypertension Father   . Heart disease Father   . Diabetes Sister        HTN  . Diabetes Brother        HTN, heart problems  . Hypertension Unknown   . Diabetes Paternal Grandmother   . Heart disease Paternal Grandfather      Review of Systems  Constitutional: Negative.  Negative for chills and fever.  HENT: Negative.  Negative for congestion and sore throat.   Respiratory: Negative.  Negative for cough and shortness of breath.   Cardiovascular: Negative.  Negative for chest pain and palpitations.  Gastrointestinal: Positive for abdominal pain (Right upper abdominal discomfort, chronic). Negative for blood in stool, diarrhea, melena, nausea and vomiting.  Genitourinary: Negative.  Negative for dysuria and hematuria.  Musculoskeletal: Negative.  Negative for back pain, myalgias and neck pain.  Skin: Negative.  Negative for rash.  Neurological: Negative.  Negative for dizziness and headaches.  Endo/Heme/Allergies: Negative.   All other systems reviewed and are negative.   Today's Vitals   06/04/19 0837  BP: 138/77  Pulse: 61  Resp: 16  Temp: (!) 97.2 F (36.2 C)  TempSrc: Temporal  SpO2: 97%  Weight: 184 lb (83.5 kg)  Height: 5\' 10"  (1.778 m)   Body mass index is 26.4 kg/m.  Physical Exam Vitals reviewed.  Constitutional:      Appearance: Normal appearance.  HENT:  Head: Normocephalic.  Eyes:     Extraocular Movements: Extraocular movements intact.     Conjunctiva/sclera: Conjunctivae normal.     Pupils: Pupils are equal, round, and reactive to light.  Cardiovascular:     Rate and Rhythm: Normal rate and regular rhythm.     Pulses: Normal pulses.     Heart  sounds: Normal heart sounds.  Pulmonary:     Effort: Pulmonary effort is normal.     Breath sounds: Normal breath sounds.  Abdominal:     General: Bowel sounds are normal. There is no distension.     Palpations: Abdomen is soft.     Tenderness: There is abdominal tenderness (Mild tenderness to palpation of right upper quadrant).  Musculoskeletal:        General: Normal range of motion.     Cervical back: Normal range of motion and neck supple.  Skin:    General: Skin is warm and dry.     Capillary Refill: Capillary refill takes less than 2 seconds.  Neurological:     General: No focal deficit present.     Mental Status: He is alert and oriented to person, place, and time.  Psychiatric:        Mood and Affect: Mood normal.        Behavior: Behavior normal.    Results for orders placed or performed in visit on 06/04/19 (from the past 24 hour(s))  POCT glucose (manual entry)     Status: Abnormal   Collection Time: 06/04/19  9:09 AM  Result Value Ref Range   POC Glucose 193 (A) 70 - 99 mg/dl  POCT glycosylated hemoglobin (Hb A1C)     Status: Abnormal   Collection Time: 06/04/19  9:20 AM  Result Value Ref Range   Hemoglobin A1C 7.3 (A) 4.0 - 5.6 %   HbA1c POC (<> result, manual entry)     HbA1c, POC (prediabetic range)     HbA1c, POC (controlled diabetic range)     A total of 45 minutes was spent with the patient, greater than 50% of which was in counseling/coordination of care regarding uncontrolled diabetes, treatment, review of all medications and new one, Farxiga, review of most recent office visit notes, review of most recent blood results including today's hemoglobin A1c, diet and nutrition, cardiovascular risks associated with uncontrolled diabetes, prognosis and need for follow-up in 3 months.   ASSESSMENT & PLAN: Hypertension associated with diabetes ([place]) Uncontrolled diabetes with hemoglobin A1c higher than before at 7.3.  Continue Metformin and Tradjenta.  Will add  Farxiga 5 mg daily.  Diet and nutrition discussed.  Follow-up in 3 months. Well-controlled hypertension.  Continue present medications.  No changes. Continue atorvastatin 80 mg daily.  Seth Pope was seen today for diabetes and medication refill.  Diagnoses and all orders for this visit:  Hypertension associated with diabetes ([place]) -     CBC with Differential/Platelet -     Comprehensive metabolic panel -     POCT glucose (manual entry) -     POCT glycosylated hemoglobin (Hb A1C)  Dyslipidemia associated with type 2 diabetes mellitus (H[facility]) -     Lipid panel  Abdominal pain, chronic, right upper quadrant -     US Abdomen Limited RUQ; Future  Type 2 diabetes mellitus with hyperglycemia, without long-term current use of insulin ([facility]) -     dapagliflozin propanediol (FARXIGA) 5 MG TABS tablet; Take 5 mg by mouth daily before breakfast. -     metFORMIN (GLUCOPHAGE-XR) 500 MG  24 hr tablet; Take 2 tablets (1,000 mg total) by mouth in the morning and at bedtime.  Pure hypercholesterolemia -     atorvastatin (LIPITOR) 80 MG tablet; Take 1 tablet (80 mg total) by mouth daily.  Other orders -     ketoconazole (NIZORAL) 2 % shampoo; Apply 1 application topically 2 (two) times a week.    Patient Instructions       If you have lab work done today you will be contacted with your lab results within the next 2 weeks.  If you have not heard from [place] then please contact us. The fastest way to get your results is to register for My Chart.   IF you received an x-Ching today, you will receive an invoice from [facility] Radiology. Please contact [facility] Radiology at [phone] with questions or concerns regarding your invoice.   IF you received labwork today, you will receive an invoice from [place]. Please contact [facility] at [phone] with questions or concerns regarding your invoice.   Our billing staff will not be able to assist you with questions regarding bills from these companies.  You  will be contacted with the lab results as soon as they are available. The fastest way to get your results is to activate your My Chart account. Instructions are located on the last page of this paperwork. If you have not heard from [place] regarding the results in 2 weeks, please contact this office.     Diabetes Mellitus and Nutrition, Adult When you have diabetes (diabetes mellitus), it is very important to have healthy eating habits because your blood sugar (glucose) levels are greatly affected by what you eat and drink. Eating healthy foods in the appropriate amounts, at about the same times every day, can help you:  Control your blood glucose.  Lower your risk of heart disease.  Improve your blood pressure.  Reach or maintain a healthy weight. Every person with diabetes is different, and each person has different needs for a meal plan. Your health care provider may recommend that you work with a diet and nutrition specialist (dietitian) to make a meal plan that is best for you. Your meal plan may vary depending on factors such as:  The calories you need.  The medicines you take.  Your weight.  Your blood glucose, blood pressure, and cholesterol levels.  Your activity level.  Other health conditions you have, such as heart or kidney disease. How do carbohydrates affect me? Carbohydrates, also called carbs, affect your blood glucose level more than any other type of food. Eating carbs naturally raises the amount of glucose in your blood. Carb counting is a method for keeping track of how many carbs you eat. Counting carbs is important to keep your blood glucose at a healthy level, especially if you use insulin or take certain oral diabetes medicines. It is important to know how many carbs you can safely have in each meal. This is different for every person. Your dietitian can help you calculate how many carbs you should have at each meal and for each snack. Foods that contain carbs  include:  Bread, cereal, rice, pasta, and crackers.  Potatoes and corn.  Peas, beans, and lentils.  Milk and yogurt.  Fruit and juice.  Desserts, such as cakes, cookies, ice cream, and candy. How does alcohol affect me? Alcohol can cause a sudden decrease in blood glucose (hypoglycemia), especially if you use insulin or take certain oral diabetes medicines. Hypoglycemia can be a life-threatening condition.  Symptoms of hypoglycemia (sleepiness, dizziness, and confusion) are similar to symptoms of having too much alcohol. If your health care provider says that alcohol is safe for you, follow these guidelines:  Limit alcohol intake to no more than 1 drink per day for nonpregnant women and 2 drinks per day for men. One drink equals 12 oz of beer, 5 oz of wine, or 1 oz of hard liquor.  Do not drink on an empty stomach.  Keep yourself hydrated with water, diet soda, or unsweetened iced tea.  Keep in mind that regular soda, juice, and other mixers may contain a lot of sugar and must be counted as carbs. What are tips for following this plan?  Reading food labels  Start by checking the serving size on the "Nutrition Facts" label of packaged foods and drinks. The amount of calories, carbs, fats, and other nutrients listed on the label is based on one serving of the item. Many items contain more than one serving per package.  Check the total grams (g) of carbs in one serving. You can calculate the number of servings of carbs in one serving by dividing the total carbs by 15. For example, if a food has 30 g of total carbs, it would be equal to 2 servings of carbs.  Check the number of grams (g) of saturated and trans fats in one serving. Choose foods that have low or no amount of these fats.  Check the number of milligrams (mg) of salt (sodium) in one serving. Most people should limit total sodium intake to less than 2,300 mg per day.  Always check the nutrition information of foods labeled  as "low-fat" or "nonfat". These foods may be higher in added sugar or refined carbs and should be avoided.  Talk to your dietitian to identify your daily goals for nutrients listed on the label. Shopping  Avoid buying canned, premade, or processed foods. These foods tend to be high in fat, sodium, and added sugar.  Shop around the outside edge of the grocery store. This includes fresh fruits and vegetables, bulk grains, fresh meats, and fresh dairy. Cooking  Use low-heat cooking methods, such as baking, instead of high-heat cooking methods like deep frying.  Cook using healthy oils, such as olive, canola, or sunflower oil.  Avoid cooking with butter, cream, or high-fat meats. Meal planning  Eat meals and snacks regularly, preferably at the same times every day. Avoid going long periods of time without eating.  Eat foods high in fiber, such as fresh fruits, vegetables, beans, and whole grains. Talk to your dietitian about how many servings of carbs you can eat at each meal.  Eat 4-6 ounces (oz) of lean protein each day, such as lean meat, chicken, fish, eggs, or tofu. One oz of lean protein is equal to: ? 1 oz of meat, chicken, or fish. ? 1 egg. ?  cup of tofu.  Eat some foods each day that contain healthy fats, such as avocado, nuts, seeds, and fish. Lifestyle  Check your blood glucose regularly.  Exercise regularly as told by your health care provider. This may include: ? 150 minutes of moderate-intensity or vigorous-intensity exercise each week. This could be brisk walking, biking, or water aerobics. ? Stretching and doing strength exercises, such as yoga or weightlifting, at least 2 times a week.  Take medicines as told by your health care provider.  Do not use any products that contain nicotine or tobacco, such as cigarettes and e-cigarettes. If you need  help quitting, ask your health care provider.  Work with a Social worker or diabetes educator to identify strategies to  manage stress and any emotional and social challenges. Questions to ask a health care provider  Do I need to meet with a diabetes educator?  Do I need to meet with a dietitian?  What number can I call if I have questions?  When are the best times to check my blood glucose? Where to find more information:  American Diabetes Association: diabetes.org  Academy of Nutrition and Dietetics: www.eatright.[employer] of Diabetes and Digestive and Kidney Diseases (NIH): [employer] Summary  A healthy meal plan will help you control your blood glucose and maintain a healthy lifestyle.  Working with a diet and nutrition specialist (dietitian) can help you make a meal plan that is best for you.  Keep in mind that carbohydrates (carbs) and alcohol have immediate effects on your blood glucose levels. It is important to count carbs and to use alcohol carefully. This information is not intended to replace advice given to you by your health care provider. Make sure you discuss any questions you have with your health care provider. Document Revised: 01/13/2017 Document Reviewed: 03/07/2016 Elsevier Patient Education  2020 Elsevier Inc.      Agustina Caroli, MD [facility]

## 2019-06-04 NOTE — Assessment & Plan Note (Signed)
Uncontrolled diabetes with hemoglobin A1c higher than before at 7.3.  Continue Metformin and Tradjenta.  Will add Farxiga 5 mg daily.  Diet and nutrition discussed.  Follow-up in 3 months. Well-controlled hypertension.  Continue present medications.  No changes. Continue atorvastatin 80 mg daily.

## 2019-06-05 LAB — CBC WITH DIFFERENTIAL/PLATELET
Basophils Absolute: 0.1 10*3/uL (ref 0.0–0.2)
Basos: 1 %
EOS (ABSOLUTE): 0.2 10*3/uL (ref 0.0–0.4)
Eos: 3 %
Hematocrit: 44.5 % (ref 37.5–51.0)
Hemoglobin: 15.7 g/dL (ref 13.0–17.7)
Immature Grans (Abs): 0 10*3/uL (ref 0.0–0.1)
Immature Granulocytes: 0 %
Lymphocytes Absolute: 1.5 10*3/uL (ref 0.7–3.1)
Lymphs: 23 %
MCH: 30.4 pg (ref 26.6–33.0)
MCHC: 35.3 g/dL (ref 31.5–35.7)
MCV: 86 fL (ref 79–97)
Monocytes Absolute: 0.6 10*3/uL (ref 0.1–0.9)
Monocytes: 9 %
Neutrophils Absolute: 4.3 10*3/uL (ref 1.4–7.0)
Neutrophils: 64 %
Platelets: 298 10*3/uL (ref 150–450)
RBC: 5.17 x10E6/uL (ref 4.14–5.80)
RDW: 13.3 % (ref 11.6–15.4)
WBC: 6.7 10*3/uL (ref 3.4–10.8)

## 2019-06-05 LAB — COMPREHENSIVE METABOLIC PANEL
ALT: 28 IU/L (ref 0–44)
AST: 22 IU/L (ref 0–40)
Albumin/Globulin Ratio: 2.3 — ABNORMAL HIGH (ref 1.2–2.2)
Albumin: 4.3 g/dL (ref 3.8–4.8)
Alkaline Phosphatase: 103 IU/L (ref 39–117)
BUN/Creatinine Ratio: 15 (ref 10–24)
BUN: 15 mg/dL (ref 8–27)
Bilirubin Total: 0.3 mg/dL (ref 0.0–1.2)
CO2: 19 mmol/L — ABNORMAL LOW (ref 20–29)
Calcium: 9.5 mg/dL (ref 8.6–10.2)
Chloride: 102 mmol/L (ref 96–106)
Creatinine, Ser: 0.99 mg/dL (ref 0.76–1.27)
GFR calc Af Amer: 91 mL/min/{1.73_m2} (ref >59)
GFR calc non Af Amer: 79 mL/min/{1.73_m2} (ref >59)
Globulin, Total: 1.9 g/dL (ref 1.5–4.5)
Glucose: 192 mg/dL — ABNORMAL HIGH (ref 65–99)
Potassium: 4.3 mmol/L (ref 3.5–5.2)
Sodium: 138 mmol/L (ref 134–144)
Total Protein: 6.2 g/dL (ref 6.0–8.5)

## 2019-06-05 LAB — LIPID PANEL
Chol/HDL Ratio: 2.5 ratio (ref 0.0–5.0)
Cholesterol, Total: 116 mg/dL (ref 100–199)
HDL: 47 mg/dL (ref >39)
LDL Chol Calc (NIH): 51 mg/dL (ref 0–99)
Triglycerides: 92 mg/dL (ref 0–149)
VLDL Cholesterol Cal: 18 mg/dL (ref 5–40)

## 2019-06-10 ENCOUNTER — Telehealth: Payer: Self-pay | Admitting: *Deleted

## 2019-06-10 NOTE — Telephone Encounter (Signed)
Pt calling to check on status of this script. I think it's a PA.

## 2019-06-10 NOTE — Telephone Encounter (Signed)
KEY [id number]  FARXIGA

## 2019-06-13 ENCOUNTER — Other Ambulatory Visit: Payer: Self-pay | Admitting: Emergency Medicine

## 2019-06-13 DIAGNOSIS — I10 Essential (primary) hypertension: Secondary | ICD-10-CM

## 2019-06-19 ENCOUNTER — Ambulatory Visit
Admission: RE | Admit: 2019-06-19 | Discharge: 2019-06-19 | Disposition: A | Discharge status: Home / Self Care | Payer: 59 | Source: Ambulatory Visit | Attending: Emergency Medicine | Admitting: Emergency Medicine

## 2019-06-19 ENCOUNTER — Other Ambulatory Visit: Payer: Self-pay | Admitting: Emergency Medicine

## 2019-06-19 ENCOUNTER — Encounter: Payer: Self-pay | Admitting: Emergency Medicine

## 2019-06-19 DIAGNOSIS — G8929 Other chronic pain: Secondary | ICD-10-CM

## 2019-06-19 DIAGNOSIS — R160 Hepatomegaly, not elsewhere classified: Secondary | ICD-10-CM

## 2019-06-20 ENCOUNTER — Other Ambulatory Visit: Payer: Self-pay | Admitting: Emergency Medicine

## 2019-06-20 ENCOUNTER — Telehealth: Payer: Self-pay | Admitting: Emergency Medicine

## 2019-06-20 DIAGNOSIS — R160 Hepatomegaly, not elsewhere classified: Secondary | ICD-10-CM

## 2019-06-20 NOTE — Telephone Encounter (Signed)
Spoke to patient about ultrasound report and need for liver MRI as well as a GI evaluation.

## 2019-06-21 ENCOUNTER — Encounter: Payer: Self-pay | Admitting: Gastroenterology

## 2019-06-26 ENCOUNTER — Other Ambulatory Visit: Payer: Self-pay | Admitting: *Deleted

## 2019-06-26 ENCOUNTER — Telehealth: Payer: Self-pay | Admitting: Emergency Medicine

## 2019-06-26 DIAGNOSIS — E1159 Type 2 diabetes mellitus with other circulatory complications: Secondary | ICD-10-CM

## 2019-06-26 DIAGNOSIS — I152 Hypertension secondary to endocrine disorders: Secondary | ICD-10-CM

## 2019-06-26 MED ORDER — LINAGLIPTIN 5 MG PO TABS: ORAL_TABLET | ORAL | 5 refills | Status: DC

## 2019-06-26 NOTE — Telephone Encounter (Signed)
Medication Refill - Medication: TRADJENTA 5 MG TABS tablet   Has the patient contacted their pharmacy? Yes.   (Agent: If no, request that the patient contact the pharmacy for the refill.) (Agent: If yes, when and what did the pharmacy advise?)  Preferred Pharmacy (with phone number or street name):  [facility] [place], [place] [address] Phone:  [phone]  Fax:  [phone]       Agent: Please be advised that RX refills may take up to 3 business days. We ask that you follow-up with your pharmacy.

## 2019-06-28 ENCOUNTER — Other Ambulatory Visit (INDEPENDENT_AMBULATORY_CARE_PROVIDER_SITE_OTHER): Payer: 59

## 2019-06-28 ENCOUNTER — Encounter: Payer: Self-pay | Admitting: Gastroenterology

## 2019-06-28 ENCOUNTER — Other Ambulatory Visit: Payer: Self-pay

## 2019-06-28 ENCOUNTER — Ambulatory Visit: Payer: 59 | Admitting: Gastroenterology

## 2019-06-28 VITALS — BP 102/70 | HR 68 | Temp 98.8°F | Ht 70.0 in | Wt 175.0 lb

## 2019-06-28 DIAGNOSIS — R1011 Right upper quadrant pain: Secondary | ICD-10-CM

## 2019-06-28 LAB — PROTIME-INR
INR: 0.9 ratio (ref 0.8–1.0)
Prothrombin Time: 10.5 s (ref 9.6–13.1)

## 2019-06-28 MED ORDER — LIDOCAINE 5 % EX PTCH: 1.0000 | MEDICATED_PATCH | CUTANEOUS | 0 refills | Status: DC

## 2019-06-28 NOTE — Patient Instructions (Addendum)
If you are age 67 or older, your body mass index should be between 23-30. Your Body mass index is 25.11 kg/m. If this is out of the aforementioned range listed, please consider follow up with your Primary Care Provider.  If you are age 68 or younger, your body mass index should be between 19-25. Your Body mass index is 25.11 kg/m. If this is out of the aformentioned range listed, please consider follow up with your Primary Care Provider.   Your provider has requested that you go to the basement level for lab work before leaving today. Press "B" on the elevator. The lab is located at the first door on the left as you exit the elevator.  Due to recent changes in healthcare laws, you may see the results of your imaging and laboratory studies on MyChart before your provider has had a chance to review them.  We understand that in some cases there may be results that are confusing or concerning to you. Not all laboratory results come back in the same time frame and the provider may be waiting for multiple results in order to interpret others.  Please give [place] 48 hours in order for your provider to thoroughly review all the results before contacting the office for clarification of your results.   You have been scheduled for an MRI at [facility] (1st floor)on 07/11/19. Your appointment time is 7:00am Please arrive 30 minutes prior to your appointment time for registration purposes. Please make certain not to have anything to eat or drink 4 hours prior to your test. In addition, if you have any metal in your body, have a pacemaker or defibrillator, please be sure to let your ordering physician know. This test typically takes 45 minutes to 1 hour to complete. Should you need to reschedule, please call [phone] to do so.  We have sent the following medications to your pharmacy for you to pick up at your convenience:  START: Lidocaine patch apply one patch every 24 hours as needed.   We  will obtain records from [facility] office  I appreciate the opportunity to care for you. Thank you for choosing me and [facility],  Dr. Harl Bowie

## 2019-06-28 NOTE — Progress Notes (Signed)
Seth Pope    [id number]    03/05/1950  Primary Care Physician:Sagardia, Ines Bloomer, MD  Referring Physician: Horald Pollen, MD [place],  [place] [address]   Chief complaint:  Liver mass  HPI:  Seth Pope is a very pleasant 69 year old male here for new patient visit for evaluation of multiple hepatic lesions  Right upper quadrant ultrasound Jun 19, 2019: Coarse heterogeneous liver parenchyma with increased echogenicity suggestive of hepatic steatosis, 6 liver mass lesions new compared to prior imaging.  Has a 2.2 cm hyperechoic lesion centrally within the liver which is stable compared to prior ultrasound in September 2019 Normal gallbladder  He started having right side abdominal pain in September 2019.  At the time he had abdominal ultrasound which showed benign appearing 2 cm hyperechoic lesion.  He has been having progressive worsening of the pain, radiating to his groin and also down his right leg.  Pain is worse with touch, significantly worse after he had abdominal ultrasound.  He had herpes zoster on the right side prior to the onset of the pain 2 years ago  Denies decreased appetite, weight loss, rectal bleeding, change in bowel habits, dysphagia, odynophagia or vomiting.  Dr Collene Mares has done 2 previous colonoscopies, he does not recall the findings.   Outpatient Encounter Medications as of 06/28/2019  Medication Sig  . ALPRAZolam (XANAX XR) 1 MG 24 hr tablet Take 1 mg by mouth daily.  Marland Kitchen ALPRAZolam (XANAX) 0.25 MG tablet TK ONE T PO PRN D  . atorvastatin (LIPITOR) 80 MG tablet Take 1 tablet (80 mg total) by mouth daily.  . dapagliflozin propanediol (FARXIGA) 5 MG TABS tablet Take 5 mg by mouth daily before breakfast.  . desipramine (NORPRAMIN) 50 MG tablet Take 150 mg by mouth at bedtime.   . hydrochlorothiazide (MICROZIDE) 12.5 MG capsule TAKE 1 CAPSULE(12.5 MG) BY MOUTH DAILY  . linagliptin (TRADJENTA) 5 MG TABS tablet TAKE 1 TABLET(5 MG)  BY MOUTH DAILY  . metFORMIN (GLUCOPHAGE-XR) 500 MG 24 hr tablet Take 2 tablets (1,000 mg total) by mouth in the morning and at bedtime.  . metoprolol tartrate (LOPRESSOR) 100 MG tablet TAKE 1 TABLET(100 MG) BY MOUTH TWICE DAILY  . ONETOUCH DELICA LANCETS FINE MISC USE AS DIRECTED TO CHECK BLOOD GLUCOSE UP TO THREE TIMES DAILY  . ONETOUCH VERIO test strip USE TO CHECK BLOOD SUGAR UP TO THREE TIMES DAILY.  Marland Kitchen gabapentin (NEURONTIN) 300 MG capsule Take 2 capsules (600 mg total) by mouth 2 (two) times daily.  Marland Kitchen lisinopril (ZESTRIL) 10 MG tablet Take 1 tablet (10 mg total) by mouth daily.  . [DISCONTINUED] aspirin 81 MG tablet Take 81 mg by mouth every other day.   . [DISCONTINUED] cyclobenzaprine (FLEXERIL) 5 MG tablet Take 1 or 2 pills 3 times daily when needed for muscle relaxant. (Patient not taking: Reported on 06/04/2019)  . [DISCONTINUED] HYDROcodone-acetaminophen (NORCO/VICODIN) 5-325 MG tablet Take 1 tablet by mouth every 6 (six) hours as needed for moderate pain.  . [DISCONTINUED] ibuprofen (ADVIL,MOTRIN) 800 MG tablet Take 1 tablet (800 mg total) by mouth every 8 (eight) hours as needed.  . [DISCONTINUED] ketoconazole (NIZORAL) 2 % shampoo Apply 1 application topically 2 (two) times a week.  . [DISCONTINUED] nitroGLYCERIN (NITROSTAT) 0.4 MG SL tablet Place one under the tongue as needed for chest pain. Repeat in 5 minutes if no effect. Call 911 if you need to take the medication  . [DISCONTINUED] traMADol (  ULTRAM) 50 MG tablet Take 1 tablet (50 mg total) by mouth every 6 (six) hours as needed.   No facility-administered encounter medications on file as of 06/28/2019.    Allergies as of 06/28/2019 - Review Complete 06/28/2019  Allergen Reaction Noted  . Penicillins Rash 03/14/2011    Past Medical History:  Diagnosis Date  . Anxiety   . CAD (coronary artery disease)    a. cath 10/23/13: nonobstructive CAD, nl LV function, rec risk factor modification  . Chronic kidney disease    h/o  kidney stone  . Diabetes mellitus    type 2  . History of pneumonia   . Hyperlipidemia   . Hypertension   . Sinus bradycardia    a. as low as 36 bpm; b. multiple pauses 2.1-2.3 seconds  . Ulcer   . Umbilical hernia     Past Surgical History:  Procedure Laterality Date  . BACK SURGERY    . COLONOSCOPY    . KNEE SURGERY Right 1970  . LEFT HEART CATHETERIZATION WITH CORONARY ANGIOGRAM N/A 10/23/2013   Procedure: LEFT HEART CATHETERIZATION WITH CORONARY ANGIOGRAM;  Surgeon: Peter M Martinique, MD;  Location: [facility] CATH LAB;  Service: Cardiovascular;  Laterality: N/A;  . MICROLARYNGOSCOPY N/A 09/23/2015   Procedure: MICROLARYNGOSCOPY REMOVAL OF LESION;  Surgeon: Izora Gala, MD;  Location: [facility];  Service: ENT;  Laterality: N/A;  . NM [place]  08/2010   bruce myoview - normal perfusion in all regions, EF 66%, EKG negative for ischemia, no wall motion abnormalities, normal/low risk study                                       . ROTATOR CUFF REPAIR Bilateral 1991   x2  . UMBILICAL HERNIA REPAIR  1988    Family History  Problem Relation Age of Onset  . Heart attack Father   . Hypertension Father   . Heart disease Father   . Diabetes Sister        HTN  . Diabetes Brother        HTN, heart problems  . Hypertension Other   . Diabetes Paternal Grandmother   . Heart disease Paternal Grandfather     Social History   Socioeconomic History  . Marital status: Married    Spouse name: Abigail Butts  . Number of children: 2  . Years of education: 10th   . Highest education level: Not on file  Occupational History  . Occupation: self-employed (Investment banker, operational)  Tobacco Use  . Smoking status: Former Smoker    Packs/day: 2.50    Years: 18.00    Pack years: 45.00    Types: Cigarettes    Quit date: 07/16/1986    Years since quitting: 32.9  . Smokeless tobacco: Never Used  Substance and Sexual Activity  . Alcohol use: Not Currently  . Drug use: Yes    Types: Marijuana    Comment:  occasional  . Sexual activity: Yes  Other Topics Concern  . Not on file  Social History Narrative   Lives at home w/ his wife   Right-handed   Daily caffeine   Social Determinants of Health   Financial Resource Strain:   . Difficulty of Paying Living Expenses:   Food Insecurity:   . Worried About Charity fundraiser in the Last Year:   . [place] in the Last Year:  Transportation Needs:   . Film/video editor (Medical):   Marland Kitchen Lack of Transportation (Non-Medical):   Physical Activity:   . Days of Exercise per Week:   . Minutes of Exercise per Session:   Stress:   . Feeling of Stress :   Social Connections:   . Frequency of Communication with Friends and Family:   . Frequency of Social Gatherings with Friends and Family:   . Attends Religious Services:   . Active Member of Clubs or Organizations:   . Attends Archivist Meetings:   Marland Kitchen Marital Status:   Intimate Partner Violence:   . Fear of Current or Ex-Partner:   . Emotionally Abused:   Marland Kitchen Physically Abused:   . Sexually Abused:       Review of systems: All other review of systems negative except as mentioned in the HPI.   Physical Exam: Vitals:   06/28/19 1012  Temp: 98.8 F (37.1 C)   Body mass index is 25.11 kg/m. Gen:      No acute distress Abd:      soft, no palpable masses, no distension, tender in multiple spots with superficial touch but no significant tenderness on deep palpation Ext:    No edema Skin:      Warm and dry; no rash Neuro: alert and oriented x 3 Psych: normal mood and affect  Data Reviewed:  Reviewed labs, radiology imaging, old records and pertinent past GI work up   Assessment and Plan/Recommendations:  69 year old very pleasant male with history of hypertension, hyperlipidemia, type 2 diabetes complicated by neuropathy here with complaints of right side abdominal pain radiating to the groin  Abdominal ultrasound with multiple liver lesions, new compared to  prior imaging in September 2019 We will obtain MRI abdomen liver protocol to further evaluate the liver lesions Check tumor markers AFP, CEA and CA 19-9 level  Abdominal pain less likely to be related to the small liver lesions, 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 Lorie Apley office  The patient was provided an opportunity to ask questions and all were answered. The patient agreed with the plan and demonstrated an understanding of the instructions.  Damaris Hippo , MD    CC: Horald Pollen, *  Addendum: Colonoscopy May 15, 2013 by Dr. Collene Mares for personal history of adenomatous colon polyps: Pancolonic diverticulosis.  Normal terminal ileum.  3 small polyps [tubular adenomas] resected from ascending colon, sigmoid and rectosigmoid colon.  Internal hemorrhoids.  Recommended recall colonoscopy in 5 years

## 2019-07-01 ENCOUNTER — Telehealth: Payer: Self-pay | Admitting: Gastroenterology

## 2019-07-01 LAB — HEPATITIS B SURFACE ANTIGEN: Hepatitis B Surface Ag: NONREACTIVE

## 2019-07-01 LAB — CEA: CEA: 1.2 ng/mL

## 2019-07-01 LAB — AFP TUMOR MARKER: AFP-Tumor Marker: 1.8 ng/mL (ref <6.1)

## 2019-07-01 LAB — HEPATITIS A ANTIBODY, TOTAL: Hepatitis A AB,Total: NONREACTIVE

## 2019-07-01 LAB — CANCER ANTIGEN 19-9: CA 19-9: 12 U/mL (ref <34)

## 2019-07-01 LAB — HEPATITIS B SURFACE ANTIBODY,QUALITATIVE: Hep B S Ab: NONREACTIVE

## 2019-07-01 NOTE — Telephone Encounter (Addendum)
Confirmed that the [facility] Imaging of the liver is cancelled. He has MRI abd/pelvis scheduled for 07/11/19.

## 2019-07-02 ENCOUNTER — Encounter: Payer: Self-pay | Admitting: Gastroenterology

## 2019-07-03 ENCOUNTER — Telehealth: Payer: Self-pay

## 2019-07-03 NOTE — Telephone Encounter (Signed)
-----   Message from Mauri Pole, MD sent at 07/02/2019  2:31 PM EDT ----- Regarding: Colonoscopy Elmyra Ricks,  Can you please contact patient and schedule him for colonoscopy with RN previsit, next available appointment.  He is past due for recall colonoscopy based on records from Dr. Lorie Apley office. Thank you VN

## 2019-07-03 NOTE — Telephone Encounter (Signed)
Called the patient's phone [phone]. Left a message with the reason for the call. Asked he call back and schedule his visits or ask to speak with me. He needs a Media planner and an [place] colonoscopy.

## 2019-07-08 NOTE — Telephone Encounter (Signed)
Patient scheduled.

## 2019-07-10 ENCOUNTER — Telehealth: Payer: Self-pay | Admitting: Gastroenterology

## 2019-07-10 ENCOUNTER — Telehealth: Payer: Self-pay

## 2019-07-10 ENCOUNTER — Telehealth: Payer: Self-pay | Admitting: Emergency Medicine

## 2019-07-10 NOTE — Telephone Encounter (Signed)
Message was taken care of. Spoken to provider.

## 2019-07-10 NOTE — Telephone Encounter (Signed)
Copied from [place] [phone]. Topic: General - Other >> Jul 10, 2019 12:11 PM Rainey Pines A wrote: Patients was scheduled for MRI that has been cancelled and patient was referred to a specialist that also scheduled a MRI. Patients spouse stated that the insurance company needs to be contacted in regards to having order placed by Dr .Pamella Pert again as urgent. Please advise

## 2019-07-10 NOTE — Telephone Encounter (Signed)
Called [facility] and withdrew claim for case # [id number] Ph # [phone] for order # [phone] . Left detailed message for GI authorization specialist .

## 2019-07-10 NOTE — Telephone Encounter (Signed)
Returned call to patient, pt was referred to GI and orders for MRI scheduled for June 4 th per pt, he saw specialist and she ordered MRI ABD which cannot be authorized until Liver MRI is cancelled.  Will try and contact [facility] for further details.      Copied from [place] [phone]. Topic: General - Other >> Jul 10, 2019 12:22 PM Sharene Skeans wrote: Reason for CRM: Ref# [id number] and a second number needed to cancel June 4th MRI and clear the PA for that MRI so the one with the specialist can go thru/ this is urgent and they cant get the appt fr tomorrow funded if this one is not cancelled. [id number] [employer] stated this number is something the office needs to give [facility] when the office calls.

## 2019-07-11 ENCOUNTER — Ambulatory Visit (HOSPITAL_COMMUNITY): Payer: 59

## 2019-07-11 ENCOUNTER — Ambulatory Visit (HOSPITAL_COMMUNITY): Admission: RE | Admit: 2019-07-11 | Payer: 59 | Source: Ambulatory Visit

## 2019-07-16 ENCOUNTER — Telehealth: Payer: Self-pay

## 2019-07-16 ENCOUNTER — Other Ambulatory Visit: Payer: Self-pay | Admitting: Emergency Medicine

## 2019-07-16 MED ORDER — CANAGLIFLOZIN 300 MG PO TABS
300.0000 mg | ORAL_TABLET | Freq: Every day | ORAL | 3 refills | Status: DC
Start: 2019-07-16 — End: 2019-07-18

## 2019-07-16 NOTE — Telephone Encounter (Signed)
Prescription for Invokana sent to pharmacy.  Thanks.

## 2019-07-16 NOTE — Telephone Encounter (Signed)
[facility] pharmacy fax received requesting alternate therapy due to change in coverage . Preferred alternative is invokana tabs. Change from farxiga 5 mg .

## 2019-07-18 ENCOUNTER — Ambulatory Visit (AMBULATORY_SURGERY_CENTER): Payer: Self-pay | Admitting: *Deleted

## 2019-07-18 ENCOUNTER — Other Ambulatory Visit: Payer: Self-pay

## 2019-07-18 VITALS — Ht 70.0 in | Wt 176.2 lb

## 2019-07-18 DIAGNOSIS — Z86010 Personal history of colonic polyps: Secondary | ICD-10-CM

## 2019-07-18 MED ORDER — SUTAB 1479-225-188 MG PO TABS: 1.0000 | ORAL_TABLET | ORAL | 0 refills | Status: DC

## 2019-07-18 NOTE — Progress Notes (Signed)
Patient denies any allergies to egg or soy products. Patient denies complications with anesthesia/sedation.  Patient denies oxygen use at home and denies diet medications. Emmi instructions for colonoscopy explained and given to patient.  Patient states recvd both covid vaccinations, last one in April 2021.

## 2019-07-19 ENCOUNTER — Other Ambulatory Visit: Payer: 59

## 2019-07-19 ENCOUNTER — Telehealth: Payer: Self-pay | Admitting: Gastroenterology

## 2019-07-19 ENCOUNTER — Ambulatory Visit (HOSPITAL_COMMUNITY)
Admission: RE | Admit: 2019-07-19 | Discharge: 2019-07-19 | Disposition: A | Discharge status: Home / Self Care | Payer: 59 | Source: Ambulatory Visit | Attending: Gastroenterology | Admitting: Gastroenterology

## 2019-07-19 DIAGNOSIS — R1011 Right upper quadrant pain: Secondary | ICD-10-CM | POA: Diagnosis not present

## 2019-07-19 MED ORDER — GADOBUTROL 1 MMOL/ML IV SOLN
8.0000 mL | Freq: Once | INTRAVENOUS | Status: AC | PRN
  Administered 2019-07-19: 8 mL via INTRAVENOUS

## 2019-07-19 NOTE — Telephone Encounter (Signed)
MRI results please advise

## 2019-07-19 NOTE — Telephone Encounter (Signed)
I feel the pain is secondary to neuropathy s/p herpes zoster as discussed during the office visit. I will discuss more detail at follow up visit when he comes in for the colonoscopy. Thanks.

## 2019-07-19 NOTE — Telephone Encounter (Signed)
Pt aware of results, see result note. Pt wants to know what Dr. Silverio Decamp thinks the pain is coming from and what would the next step be. Please advise.

## 2019-07-22 ENCOUNTER — Ambulatory Visit (HOSPITAL_COMMUNITY): Payer: 59

## 2019-07-22 NOTE — Telephone Encounter (Signed)
Spoke with patient, patient has more questions that he would like to discuss in further detail at time of colonoscopy. Pt states that he is still having that RUQ - mid pain that has been radiating to his groin area, pt states that near the top of his groin area is sore and tender to the touch.

## 2019-07-23 ENCOUNTER — Other Ambulatory Visit: Payer: Self-pay | Admitting: *Deleted

## 2019-07-23 MED ORDER — ONETOUCH VERIO VI STRP: ORAL_STRIP | 0 refills | Status: DC

## 2019-07-27 ENCOUNTER — Other Ambulatory Visit: Payer: Self-pay | Admitting: Emergency Medicine

## 2019-07-27 DIAGNOSIS — E119 Type 2 diabetes mellitus without complications: Secondary | ICD-10-CM

## 2019-07-27 NOTE — Telephone Encounter (Signed)
Requested medication (s) are due for refill today: yes  Requested medication (s) are on the active medication list: yes  Last refill:  02/22/18  Future visit scheduled: no  Notes to clinic:  Prescription expired 07/18/19   Requested Prescriptions  Pending Prescriptions Disp Refills   gabapentin (NEURONTIN) 300 MG capsule [Pharmacy Med Name: GABAPENTIN 300MG  CAPSULES] 360 capsule 1    Sig: TAKE 2 CAPSULES(600 MG) BY MOUTH TWICE DAILY      Neurology: Anticonvulsants - gabapentin Passed - 07/27/2019 10:46 AM      Passed - Valid encounter within last 12 months    Recent Outpatient Visits           1 month ago Hypertension associated with diabetes [facility])   Primary Care at [facility], Ines Bloomer, MD   8 months ago Hypertension associated with diabetes [facility])   Primary Care at [place], [place], MD   1 year ago Type 2 diabetes mellitus with hyperglycemia, without long-term current use of insulin [facility])   Primary Care at [facility], Ines Bloomer, MD   1 year ago Essential hypertension   Primary Care at [facility], [place], MD   1 year ago Type 2 diabetes mellitus without complication, without long-term current use of insulin [facility])   Primary Care at [facility], [place], MD

## 2019-07-31 ENCOUNTER — Encounter: Payer: Self-pay | Admitting: Gastroenterology

## 2019-08-07 ENCOUNTER — Other Ambulatory Visit: Payer: Self-pay

## 2019-08-07 ENCOUNTER — Encounter: Payer: Self-pay | Admitting: Gastroenterology

## 2019-08-07 ENCOUNTER — Ambulatory Visit (AMBULATORY_SURGERY_CENTER): Payer: 59 | Admitting: Gastroenterology

## 2019-08-07 VITALS — BP 101/67 | HR 59 | Temp 96.9°F | Resp 22 | Ht 70.0 in | Wt 176.0 lb

## 2019-08-07 DIAGNOSIS — Z86010 Personal history of colonic polyps: Secondary | ICD-10-CM

## 2019-08-07 DIAGNOSIS — D123 Benign neoplasm of transverse colon: Secondary | ICD-10-CM | POA: Diagnosis not present

## 2019-08-07 MED ORDER — SODIUM CHLORIDE 0.9 % IV SOLN
500.0000 mL | Freq: Once | INTRAVENOUS | Status: DC
Start: 2019-08-07 — End: 2019-08-07

## 2019-08-07 NOTE — Progress Notes (Signed)
Called to room to assist during endoscopic procedure.  Patient ID and intended procedure confirmed with present staff. Received instructions for my participation in the procedure from the performing physician.  

## 2019-08-07 NOTE — Patient Instructions (Signed)
Handout on polyps, diverticulosis given. Clip card given.    YOU HAD AN ENDOSCOPIC PROCEDURE TODAY AT [facility]:   Refer to the procedure report that was given to you for any specific questions about what was found during the examination.  If the procedure report does not answer your questions, please call your gastroenterologist to clarify.  If you requested that your care partner not be given the details of your procedure findings, then the procedure report has been included in a sealed envelope for you to review at your convenience later.  YOU SHOULD EXPECT: Some feelings of bloating in the abdomen. Passage of more gas than usual.  Walking can help get rid of the air that was put into your GI tract during the procedure and reduce the bloating. If you had a lower endoscopy (such as a colonoscopy or flexible sigmoidoscopy) you may notice spotting of blood in your stool or on the toilet paper. If you underwent a bowel prep for your procedure, you may not have a normal bowel movement for a few days.  Please Note:  You might notice some irritation and congestion in your nose or some drainage.  This is from the oxygen used during your procedure.  There is no need for concern and it should clear up in a day or so.  SYMPTOMS TO REPORT IMMEDIATELY:   Following lower endoscopy (colonoscopy or flexible sigmoidoscopy):  Excessive amounts of blood in the stool  Significant tenderness or worsening of abdominal pains  Swelling of the abdomen that is new, acute  Fever of 100F or higher   For urgent or emergent issues, a gastroenterologist can be reached at any hour by calling [phone]. Do not use MyChart messaging for urgent concerns.    DIET:  We do recommend a small meal at first, but then you may proceed to your regular diet.  Drink plenty of fluids but you should avoid alcoholic beverages for 24 hours.  ACTIVITY:  You should plan to take it easy for the rest of today and  you should NOT DRIVE or use heavy machinery until tomorrow (because of the sedation medicines used during the test).    FOLLOW UP: Our staff will call the number listed on your records 48-72 hours following your procedure to check on you and address any questions or concerns that you may have regarding the information given to you following your procedure. If we do not reach you, we will leave a message.  We will attempt to reach you two times.  During this call, we will ask if you have developed any symptoms of COVID 19. If you develop any symptoms (ie: fever, flu-like symptoms, shortness of breath, cough etc.) before then, please call [phone].  If you test positive for Covid 19 in the 2 weeks post procedure, please call and report this information to [place].    If any biopsies were taken you will be contacted by phone or by letter within the next 1-3 weeks.  Please call us at [phone] if you have not heard about the biopsies in 3 weeks.    SIGNATURES/CONFIDENTIALITY: You and/or your care partner have signed paperwork which will be entered into your electronic medical record.  These signatures attest to the fact that that the information above on your After Visit Summary has been reviewed and is understood.  Full responsibility of the confidentiality of this discharge information lies with you and/or your care-partner.

## 2019-08-07 NOTE — Op Note (Signed)
[facility] Patient Name: Seth Pope Procedure Date: 08/07/2019 2:22 PM MRN: [id number] Endoscopist: Mauri Pole , MD Age: 69 Referring MD:  Date of Birth: 21-Dec-1950 Gender: Male Account #: [id number] Procedure:                Colonoscopy Indications:              High risk colon cancer surveillance: Personal                            history of multiple (3 or more) adenomas Medicines:                Monitored Anesthesia Care Procedure:                Pre-Anesthesia Assessment:                           - Prior to the procedure, a History and Physical                            was performed, and patient medications and                            allergies were reviewed. The patient's tolerance of                            previous anesthesia was also reviewed. The risks                            and benefits of the procedure and the sedation                            options and risks were discussed with the patient.                            All questions were answered, and informed consent                            was obtained. Prior Anticoagulants: The patient has                            taken no previous anticoagulant or antiplatelet                            agents. ASA Grade Assessment: II - A patient with                            mild systemic disease. After reviewing the risks                            and benefits, the patient was deemed in                            satisfactory condition to undergo the procedure.  After obtaining informed consent, the colonoscope                            was passed under direct vision. Throughout the                            procedure, the patient's blood pressure, pulse, and                            oxygen saturations were monitored continuously. The                            Colonoscope was introduced through the anus and                            advanced to the the cecum,  identified by                            appendiceal orifice and ileocecal valve. The                            colonoscopy was performed without difficulty. The                            patient tolerated the procedure well. The quality                            of the bowel preparation was adequate. The                            ileocecal valve, appendiceal orifice, and rectum                            were photographed. Scope In: 2:41:26 PM Scope Out: 3:03:56 PM Scope Withdrawal Time: 0 hours 16 minutes 33 seconds  Total Procedure Duration: 0 hours 22 minutes 30 seconds  Findings:                 The perianal and digital rectal examinations were                            normal.                           Five sessile polyps were found in the transverse                            colon and hepatic flexure. The polyps were 5 to 9                            mm in size. These polyps were removed with a cold                            snare. Resection and retrieval were complete.  A 12 mm polyp was found in the transverse colon.                            The polyp was sessile. The polyp was removed with a                            cold snare. Resection and retrieval were complete.                            Noted oozing post polypectomy. To prevent bleeding                            after the polypectomy, one hemostatic clip was                            successfully placed (MR conditional). There was no                            bleeding at the end of the procedure.                           Scattered small and large-mouthed diverticula were                            found in the sigmoid colon, descending colon,                            transverse colon and ascending colon.                           Non-bleeding internal hemorrhoids were found during                            retroflexion. The hemorrhoids were medium-sized.                            The exam was otherwise without abnormality. Complications:            No immediate complications. Estimated Blood Loss:     Estimated blood loss was minimal. Impression:               - Five 5 to 9 mm polyps in the transverse colon and                            at the hepatic flexure, removed with a cold snare.                            Resected and retrieved.                           - One 12 mm polyp in the transverse colon, removed                            with a cold snare. Resected  and retrieved. Clip (MR                            conditional) was placed.                           - Moderate diverticulosis in the sigmoid colon, in                            the descending colon, in the transverse colon and                            in the ascending colon.                           - Non-bleeding internal hemorrhoids.                           - The examination was otherwise normal. Recommendation:           - Patient has a contact number available for                            emergencies. The signs and symptoms of potential                            delayed complications were discussed with the                            patient. Return to normal activities tomorrow.                            Written discharge instructions were provided to the                            patient.                           - Resume previous diet.                           - Continue present medications.                           - Await pathology results.                           - Repeat colonoscopy in 3 years for surveillance                            based on pathology results. Mauri Pole, MD 08/07/2019 3:14:03 PM This report has been signed electronically.

## 2019-08-07 NOTE — Progress Notes (Signed)
Report to PACU, RN, vss, BBS= Clear.  

## 2019-08-09 ENCOUNTER — Telehealth: Payer: Self-pay

## 2019-08-09 NOTE — Telephone Encounter (Signed)
Attempted to reach pt. With follow-up call following endoscopic procedure 08/07/2019.  LM On pt. Voice mail to call if he has any questions or concerns.

## 2019-08-09 NOTE — Telephone Encounter (Signed)
First attempt follow up call to pt, lm on vm 

## 2019-08-14 ENCOUNTER — Other Ambulatory Visit: Payer: Self-pay

## 2019-08-14 ENCOUNTER — Encounter: Payer: Self-pay | Admitting: Emergency Medicine

## 2019-08-14 ENCOUNTER — Ambulatory Visit: Payer: 59 | Admitting: Emergency Medicine

## 2019-08-14 VITALS — BP 137/86 | HR 89 | Temp 97.6°F | Ht 70.0 in | Wt 171.6 lb

## 2019-08-14 DIAGNOSIS — E785 Hyperlipidemia, unspecified: Secondary | ICD-10-CM

## 2019-08-14 DIAGNOSIS — E1169 Type 2 diabetes mellitus with other specified complication: Secondary | ICD-10-CM | POA: Diagnosis not present

## 2019-08-14 DIAGNOSIS — E1159 Type 2 diabetes mellitus with other circulatory complications: Secondary | ICD-10-CM | POA: Diagnosis not present

## 2019-08-14 DIAGNOSIS — Z125 Encounter for screening for malignant neoplasm of prostate: Secondary | ICD-10-CM

## 2019-08-14 DIAGNOSIS — R351 Nocturia: Secondary | ICD-10-CM

## 2019-08-14 DIAGNOSIS — K4090 Unilateral inguinal hernia, without obstruction or gangrene, not specified as recurrent: Secondary | ICD-10-CM

## 2019-08-14 DIAGNOSIS — I152 Hypertension secondary to endocrine disorders: Secondary | ICD-10-CM

## 2019-08-14 DIAGNOSIS — I10 Essential (primary) hypertension: Secondary | ICD-10-CM | POA: Diagnosis not present

## 2019-08-14 LAB — POCT GLYCOSYLATED HEMOGLOBIN (HGB A1C): Hemoglobin A1C: 6.6 % — AB (ref 4.0–5.6)

## 2019-08-14 LAB — GLUCOSE, POCT (MANUAL RESULT ENTRY): POC Glucose: 150 mg/dl — AB (ref 70–99)

## 2019-08-14 MED ORDER — CANAGLIFLOZIN 100 MG PO TABS: 100.0000 mg | ORAL_TABLET | Freq: Every day | ORAL | 3 refills | Status: DC

## 2019-08-14 NOTE — Assessment & Plan Note (Signed)
Hypertension well-controlled.  Continue present medications. Diabetes much improved with hemoglobin A1c of 6.6.  Continue Metformin, Tradjenta.  Wilder Glade not covered by [employer].  Will replace with Invokana 100 mg daily.  Continue diet nutrition and exercise. Follow-up in 6 months.

## 2019-08-14 NOTE — Patient Instructions (Addendum)
If you have lab work done today you will be contacted with your lab results within the next 2 weeks.  If you have not heard from [place] then please contact us. The fastest way to get your results is to register for My Chart.   IF you received an x-Emani today, you will receive an invoice from [facility]. Please contact [facility] at [phone] with questions or concerns regarding your invoice.   IF you received labwork today, you will receive an invoice from [place]. Please contact [facility] at [phone] with questions or concerns regarding your invoice.   Our billing staff will not be able to assist you with questions regarding bills from these companies.  You will be contacted with the lab results as soon as they are available. The fastest way to get your results is to activate your My Chart account. Instructions are located on the last page of this paperwork. If you have not heard from [place] regarding the results in 2 weeks, please contact this office.      Inguinal Hernia, Adult An inguinal hernia is when fat or your intestines push through a weak spot in a muscle where your leg meets your lower belly (groin). This causes a rounded lump (bulge). This kind of hernia could also be:  In your scrotum, if you are male.  In folds of skin around your vagina, if you are male. There are three types of inguinal hernias. These include:  Hernias that can be pushed back into the belly (are reducible). This type rarely causes pain.  Hernias that cannot be pushed back into the belly (are incarcerated).  Hernias that cannot be pushed back into the belly and lose their blood supply (are strangulated). This type needs emergency surgery. If you do not have symptoms, you may not need treatment. If you have symptoms or a large hernia, you may need surgery. Follow these instructions at home: Lifestyle  Do these things if told by your doctor so you do not have trouble pooping  (constipation): ? Drink enough fluid to keep your pee (urine) pale yellow. ? Eat foods that have a lot of fiber. These include fresh fruits and vegetables, whole grains, and beans. ? Limit foods that are high in fat and processed sugars. These include foods that are fried or sweet. ? Take medicine for trouble pooping.  Avoid lifting heavy objects.  Avoid standing for long amounts of time.  Do not use any products that contain nicotine or tobacco. These include cigarettes and e-cigarettes. If you need help quitting, ask your doctor.  Stay at a healthy weight. General instructions  You may try to push your hernia in by very gently pressing on it when you are lying down. Do not try to force the bulge back in if it will not push in easily.  Watch your hernia for any changes in shape, size, or color. Tell your doctor if you see any changes.  Take over-the-counter and prescription medicines only as told by your doctor.  Keep all follow-up visits as told by your doctor. This is important. Contact a doctor if:  You have a fever.  You have new symptoms.  Your symptoms get worse. Get help right away if:  The area where your leg meets your lower belly has: ? Pain that gets worse suddenly. ? A bulge that gets bigger suddenly, and it does not get smaller after that. ? A bulge that turns red or purple. ? A bulge that  is painful when you touch it.  You are a man, and your scrotum: ? Suddenly feels painful. ? Suddenly changes in size.  You cannot push the hernia in by very gently pressing on it when you are lying down. Do not try to force the bulge back in if it will not push in easily.  You feel sick to your stomach (nauseous), and that feeling does not go away.  You throw up (vomit), and that keeps happening.  You have a fast heartbeat.  You cannot poop (have a bowel movement) or pass gas. These symptoms may be an emergency. Do not wait to see if the symptoms will go away. Get  medical help right away. Call your local emergency services (911 in the [place] Summary  An inguinal hernia is when fat or your intestines push through a weak spot in a muscle where your leg meets your lower belly (groin). This causes a rounded lump (bulge).  If you do not have symptoms, you may not need treatment. If you have symptoms or a large hernia, you may need surgery.  Avoid lifting heavy objects. Also avoid standing for long amounts of time.  Do not try to force the bulge back in if it will not push in easily. This information is not intended to replace advice given to you by your health care provider. Make sure you discuss any questions you have with your health care provider. Document Revised: 03/04/2017 Document Reviewed: 11/02/2016 Elsevier Patient Education  [place].  Diabetes Mellitus and Nutrition, Adult When you have diabetes (diabetes mellitus), it is very important to have healthy eating habits because your blood sugar (glucose) levels are greatly affected by what you eat and drink. Eating healthy foods in the appropriate amounts, at about the same times every day, can help you:  Control your blood glucose.  Lower your risk of heart disease.  Improve your blood pressure.  Reach or maintain a healthy weight. Every person with diabetes is different, and each person has different needs for a meal plan. Your health care provider may recommend that you work with a diet and nutrition specialist (dietitian) to make a meal plan that is best for you. Your meal plan may vary depending on factors such as:  The calories you need.  The medicines you take.  Your weight.  Your blood glucose, blood pressure, and cholesterol levels.  Your activity level.  Other health conditions you have, such as heart or kidney disease. How do carbohydrates affect me? Carbohydrates, also called carbs, affect your blood glucose level more than any other type of food. Eating carbs  naturally raises the amount of glucose in your blood. Carb counting is a method for keeping track of how many carbs you eat. Counting carbs is important to keep your blood glucose at a healthy level, especially if you use insulin or take certain oral diabetes medicines. It is important to know how many carbs you can safely have in each meal. This is different for every person. Your dietitian can help you calculate how many carbs you should have at each meal and for each snack. Foods that contain carbs include:  Bread, cereal, rice, pasta, and crackers.  Potatoes and corn.  Peas, beans, and lentils.  Milk and yogurt.  Fruit and juice.  Desserts, such as cakes, cookies, ice cream, and candy. How does alcohol affect me? Alcohol can cause a sudden decrease in blood glucose (hypoglycemia), especially if you use insulin or take certain oral  diabetes medicines. Hypoglycemia can be a life-threatening condition. Symptoms of hypoglycemia (sleepiness, dizziness, and confusion) are similar to symptoms of having too much alcohol. If your health care provider says that alcohol is safe for you, follow these guidelines:  Limit alcohol intake to no more than 1 drink per day for nonpregnant women and 2 drinks per day for men. One drink equals 12 oz of beer, 5 oz of wine, or 1 oz of hard liquor.  Do not drink on an empty stomach.  Keep yourself hydrated with water, diet soda, or unsweetened iced tea.  Keep in mind that regular soda, juice, and other mixers may contain a lot of sugar and must be counted as carbs. What are tips for following this plan?  Reading food labels  Start by checking the serving size on the "Nutrition Facts" label of packaged foods and drinks. The amount of calories, carbs, fats, and other nutrients listed on the label is based on one serving of the item. Many items contain more than one serving per package.  Check the total grams (g) of carbs in one serving. You can calculate  the number of servings of carbs in one serving by dividing the total carbs by 15. For example, if a food has 30 g of total carbs, it would be equal to 2 servings of carbs.  Check the number of grams (g) of saturated and trans fats in one serving. Choose foods that have low or no amount of these fats.  Check the number of milligrams (mg) of salt (sodium) in one serving. Most people should limit total sodium intake to less than 2,300 mg per day.  Always check the nutrition information of foods labeled as "low-fat" or "nonfat". These foods may be higher in added sugar or refined carbs and should be avoided.  Talk to your dietitian to identify your daily goals for nutrients listed on the label. Shopping  Avoid buying canned, premade, or processed foods. These foods tend to be high in fat, sodium, and added sugar.  Shop around the outside edge of the grocery store. This includes fresh fruits and vegetables, bulk grains, fresh meats, and fresh dairy. Cooking  Use low-heat cooking methods, such as baking, instead of high-heat cooking methods like deep frying.  Cook using healthy oils, such as olive, canola, or sunflower oil.  Avoid cooking with butter, cream, or high-fat meats. Meal planning  Eat meals and snacks regularly, preferably at the same times every day. Avoid going long periods of time without eating.  Eat foods high in fiber, such as fresh fruits, vegetables, beans, and whole grains. Talk to your dietitian about how many servings of carbs you can eat at each meal.  Eat 4-6 ounces (oz) of lean protein each day, such as lean meat, chicken, fish, eggs, or tofu. One oz of lean protein is equal to: ? 1 oz of meat, chicken, or fish. ? 1 egg. ?  cup of tofu.  Eat some foods each day that contain healthy fats, such as avocado, nuts, seeds, and fish. Lifestyle  Check your blood glucose regularly.  Exercise regularly as told by your health care provider. This may include: ? 150  minutes of moderate-intensity or vigorous-intensity exercise each week. This could be brisk walking, biking, or water aerobics. ? Stretching and doing strength exercises, such as yoga or weightlifting, at least 2 times a week.  Take medicines as told by your health care provider.  Do not use any products that contain nicotine or  tobacco, such as cigarettes and e-cigarettes. If you need help quitting, ask your health care provider.  Work with a Social worker or diabetes educator to identify strategies to manage stress and any emotional and social challenges. Questions to ask a health care provider  Do I need to meet with a diabetes educator?  Do I need to meet with a dietitian?  What number can I call if I have questions?  When are the best times to check my blood glucose? Where to find more information:  American Diabetes Association: diabetes.org  Academy of Nutrition and Dietetics: www.eatright.[employer] of Diabetes and Digestive and Kidney Diseases (NIH): [employer] Summary  A healthy meal plan will help you control your blood glucose and maintain a healthy lifestyle.  Working with a diet and nutrition specialist (dietitian) can help you make a meal plan that is best for you.  Keep in mind that carbohydrates (carbs) and alcohol have immediate effects on your blood glucose levels. It is important to count carbs and to use alcohol carefully. This information is not intended to replace advice given to you by your health care provider. Make sure you discuss any questions you have with your health care provider. Document Revised: 01/13/2017 Document Reviewed: 03/07/2016 Elsevier Patient Education  2020 [employer].

## 2019-08-14 NOTE — Progress Notes (Signed)
Seth Pope 69 y.o.   Chief Complaint  Patient presents with  . right groin pain  . possible hernia    requesting prostate exam as well   ASSESSMENT & PLAN: Last office visit on 06/04/2019: Hypertension associated with diabetes ([place]) Uncontrolled diabetes with hemoglobin A1c higher than before at 7.3.  Continue Metformin and Tradjenta.  Will add Farxiga 5 mg daily.  Diet and nutrition discussed.  Follow-up in 3 months. Well-controlled hypertension.  Continue present medications.  No changes. Continue atorvastatin 80 mg daily HISTORY OF PRESENT ILLNESS: This is a 69 y.o. male here for follow-up of diabetes hypertension and dyslipidemia.  Also follow-up on liver masses found on ultrasound. Had unremarkable MRI of abdomen and liver.  No cancer detected.  Was seen by GI in consult.  Had colonoscopy that showed multiple polyps but no cancerous lesions. Patient states his diabetes is better with average morning glucose of 110.  Losing weight and eating better.  Exercising daily.  Compliant with medications. Requesting prostate exam and blood test.  Has some nocturia 2-3 times per night.  Today complaining of persistent right inguinal pain, possible hernia. No other complaints or medical concerns today.  Wt Readings from Last 3 Encounters:  08/14/19 171 lb 9.6 oz (77.8 kg)  08/07/19 176 lb (79.8 kg)  07/18/19 176 lb 3.2 oz (79.9 kg)   Lab Results  Component Value Date   HGBA1C 7.3 (A) 06/04/2019   BP Readings from Last 3 Encounters:  08/14/19 137/86  08/07/19 101/67  06/28/19 102/70     HPI   Prior to Admission medications   Medication Sig Start Date End Date Taking? Authorizing Provider  ALPRAZolam (XANAX XR) 1 MG 24 hr tablet Take 1 mg by mouth daily.    [provider]  ALPRAZolam Duanne Moron) 0.25 MG tablet TK ONE T PO PRN D 03/16/16   [provider]  atorvastatin (LIPITOR) 80 MG tablet Take 1 tablet (80 mg total) by mouth daily. 06/04/19   Horald Pollen,  MD  dapagliflozin propanediol (FARXIGA) 5 MG TABS tablet Take 5 mg by mouth daily.    [provider]  desipramine (NORPRAMIN) 50 MG tablet Take 150 mg by mouth at bedtime.     [provider]  gabapentin (NEURONTIN) 300 MG capsule TAKE 2 CAPSULES(600 MG) BY MOUTH TWICE DAILY 07/29/19   Jordanny Waddington, Ines Bloomer, MD  glucose blood (ONETOUCH VERIO) test strip USE TO CHECK BLOOD SUGAR UP TO THREE TIMES DAILY. 07/23/19   Horald Pollen, MD  hydrochlorothiazide (MICROZIDE) 12.5 MG capsule TAKE 1 CAPSULE(12.5 MG) BY MOUTH DAILY 06/13/19   Horald Pollen, MD  lidocaine (LIDODERM) 5 % Place 1 patch onto the skin daily. Remove & Discard patch within 12 hours or as directed by MD 06/28/19   Mauri Pole, MD  linagliptin (TRADJENTA) 5 MG TABS tablet TAKE 1 TABLET(5 MG) BY MOUTH DAILY Patient taking differently: at bedtime. TAKE 1 TABLET(5 MG) BY MOUTH DAILY 06/26/19   Horald Pollen, MD  lisinopril (ZESTRIL) 10 MG tablet Take 1 tablet (10 mg total) by mouth daily. 11/20/18 07/18/19  Horald Pollen, MD  metFORMIN (GLUCOPHAGE-XR) 500 MG 24 hr tablet Take 2 tablets (1,000 mg total) by mouth in the morning and at bedtime. 06/04/19 09/02/19  Horald Pollen, MD  metoprolol tartrate (LOPRESSOR) 100 MG tablet TAKE 1 TABLET(100 MG) BY MOUTH TWICE DAILY 06/13/19   Kevyn Boquet, Ines Bloomer, MD  [facility] DELICA LANCETS FINE MISC USE AS DIRECTED TO CHECK  BLOOD GLUCOSE UP TO THREE TIMES DAILY 12/14/15   Wardell Honour, MD    Allergies  Allergen Reactions  . Penicillins Rash    Has patient had a PCN reaction causing immediate rash, facial/tongue/throat swelling, SOB or lightheadedness with hypotension: Yes Has patient had a PCN reaction causing severe rash involving mucus membranes or skin necrosis: No Has patient had a PCN reaction that required hospitalization No Has patient had a PCN reaction occurring within the last 10 years: No If all of the above answers are "NO", then  may proceed with Cephalosporin use.     Patient Active Problem List   Diagnosis Date Noted  . History of shingles 02/28/2017  . Post herpetic neuralgia 02/28/2017  . Family history of coronary artery disease in father 10/23/2013  . Tobacco abuse 10/17/2013  . Unstable angina ([place]) 10/04/2013  . PVC's (premature ventricular contractions) 10/04/2013  . Neuropathy, diabetic ([place]) 01/31/2012  . Hypertension associated with diabetes ([place])   . Dyslipidemia associated with type 2 diabetes mellitus ([place])   . Hyperlipidemia     Past Medical History:  Diagnosis Date  . Anxiety   . Arthritis    hands  . CAD (coronary artery disease)    a. cath 10/23/13: nonobstructive CAD, nl LV function, rec risk factor modification  . Chronic kidney disease    h/o kidney stone - passed stone  . Diabetes mellitus    type 2  . History of pneumonia   . Hyperlipidemia   . Hypertension   . Liver mass   . Sinus bradycardia    a. as low as 36 bpm; b. multiple pauses 2.1-2.3 seconds  . Ulcer   . Umbilical hernia     Past Surgical History:  Procedure Laterality Date  . BACK SURGERY    . CERVICAL SPINE SURGERY    . COLONOSCOPY  2015   Dr Collene Mares 3 TAs, tics, hems  . KNEE SURGERY Right 1970  . LEFT HEART CATHETERIZATION WITH CORONARY ANGIOGRAM N/A 10/23/2013   Procedure: LEFT HEART CATHETERIZATION WITH CORONARY ANGIOGRAM;  Surgeon: Peter M Martinique, MD;  Location: [facility] CATH LAB;  Service: Cardiovascular;  Laterality: N/A;  . MICROLARYNGOSCOPY N/A 09/23/2015   Procedure: MICROLARYNGOSCOPY REMOVAL OF LESION;  Surgeon: Izora Gala, MD;  Location: [facility];  Service: ENT;  Laterality: N/A;  . NM [place]  08/2010   bruce myoview - normal perfusion in all regions, EF 66%, EKG negative for ischemia, no wall motion abnormalities, normal/low risk study                                       . ROTATOR CUFF REPAIR Bilateral 1991   x2  . UMBILICAL HERNIA REPAIR  1988    Social History   Socioeconomic History   . Marital status: Married    Spouse name: Abigail Butts  . Number of children: 2  . Years of education: 10th   . Highest education level: Not on file  Occupational History  . Occupation: self-employed (Investment banker, operational)  Tobacco Use  . Smoking status: Former Smoker    Packs/day: 2.50    Years: 18.00    Pack years: 45.00    Types: Cigarettes    Quit date: 07/16/1986    Years since quitting: 33.1  . Smokeless tobacco: Never Used  Vaping Use  . Vaping Use: Never used  Substance and Sexual Activity  . Alcohol  use: Not Currently  . Drug use: Yes    Types: Marijuana    Comment: Last use May 2021  . Sexual activity: Yes  Other Topics Concern  . Not on file  Social History Narrative   Lives at home w/ his wife   Right-handed   Daily caffeine   Social Determinants of Health   Financial Resource Strain:   . Difficulty of Paying Living Expenses:   Food Insecurity:   . Worried About Charity fundraiser in the Last Year:   . Arboriculturist in the Last Year:   Transportation Needs:   . Film/video editor (Medical):   Marland Kitchen Lack of Transportation (Non-Medical):   Physical Activity:   . Days of Exercise per Week:   . Minutes of Exercise per Session:   Stress:   . Feeling of Stress :   Social Connections:   . Frequency of Communication with Friends and Family:   . Frequency of Social Gatherings with Friends and Family:   . Attends Religious Services:   . Active Member of Clubs or Organizations:   . Attends Archivist Meetings:   Marland Kitchen Marital Status:   Intimate Partner Violence:   . Fear of Current or Ex-Partner:   . Emotionally Abused:   Marland Kitchen Physically Abused:   . Sexually Abused:     Family History  Problem Relation Age of Onset  . Heart attack Father   . Hypertension Father   . Heart disease Father   . Diabetes Sister        HTN  . Diabetes Brother        HTN, heart problems  . Hypertension Other   . Diabetes Paternal Grandmother   . Heart disease Paternal  Grandfather   . Colon cancer Neg Hx   . Rectal cancer Neg Hx   . Stomach cancer Neg Hx      Review of Systems  Constitutional: Positive for weight loss (Intentional). Negative for chills and fever.  HENT: Negative.  Negative for congestion and sore throat.   Respiratory: Negative.  Negative for cough and shortness of breath.   Cardiovascular: Negative.  Negative for chest pain and palpitations.  Gastrointestinal: Negative.  Negative for abdominal pain, blood in stool, diarrhea, melena, nausea and vomiting.  Genitourinary: Negative for dysuria, hematuria and urgency.       Nocturia  Skin: Negative.  Negative for rash.  Neurological: Negative for dizziness and headaches.  All other systems reviewed and are negative.   Today's Vitals   08/14/19 0943  BP: 137/86  Pulse: 89  Temp: 97.6 F (36.4 C)  TempSrc: Temporal  SpO2: 97%  Weight: 171 lb 9.6 oz (77.8 kg)  Height: 5\' 10"  (1.778 m)   Body mass index is 24.62 kg/m.  Physical Exam Vitals reviewed.  Constitutional:      Appearance: Normal appearance.  HENT:     Head: Normocephalic.  Eyes:     Extraocular Movements: Extraocular movements intact.     Pupils: Pupils are equal, round, and reactive to light.  Cardiovascular:     Rate and Rhythm: Normal rate and regular rhythm.     Pulses: Normal pulses.     Heart sounds: Normal heart sounds.  Pulmonary:     Effort: Pulmonary effort is normal.     Breath sounds: Normal breath sounds.  Abdominal:     General: There is no distension.     Palpations: Abdomen is soft.     Tenderness: There  is no abdominal tenderness.     Hernia: A hernia is present. Hernia is present in the right inguinal area. There is no hernia in the left inguinal area.  Genitourinary:    Penis: Uncircumcised.      Testes: Normal.     Epididymis:     Right: Normal.     Left: Normal.     Prostate: Enlarged. Not tender and no nodules present.  Musculoskeletal:        General: Normal range of motion.       Cervical back: Normal range of motion and neck supple.  Lymphadenopathy:     Lower Body: No right inguinal adenopathy. No left inguinal adenopathy.  Skin:    General: Skin is warm and dry.     Capillary Refill: Capillary refill takes less than 2 seconds.  Neurological:     General: No focal deficit present.     Mental Status: He is alert and oriented to person, place, and time.  Psychiatric:        Mood and Affect: Mood normal.        Behavior: Behavior normal.    Results for orders placed or performed in visit on 08/14/19 (from the past 24 hour(s))  POCT glucose (manual entry)     Status: Abnormal   Collection Time: 08/14/19 10:42 AM  Result Value Ref Range   POC Glucose 150 (A) 70 - 99 mg/dl  POCT glycosylated hemoglobin (Hb A1C)     Status: Abnormal   Collection Time: 08/14/19 10:49 AM  Result Value Ref Range   Hemoglobin A1C 6.6 (A) 4.0 - 5.6 %   HbA1c POC (<> result, manual entry)     HbA1c, POC (prediabetic range)     HbA1c, POC (controlled diabetic range)     A total of 40 minutes was spent with the patient, greater than 50% of which was in counseling/coordination of care regarding diabetes/hypertension and its management, cardiovascular risks associated with these conditions, review of all medications and new one Invokana, review of most recent blood work results including today's hemoglobin A1c, review of most recent office visit notes, finding of right inguinal hernia and its treatment including surgical consult, prostate examination findings and screening for cancer, prognosis and need for follow-up.   ASSESSMENT & PLAN: Hypertension associated with diabetes ([place]) Hypertension well-controlled.  Continue present medications. Diabetes much improved with hemoglobin A1c of 6.6.  Continue Metformin, Tradjenta.  Wilder Glade not covered by [employer].  Will replace with Invokana 100 mg daily.  Continue diet nutrition and exercise. Follow-up in 6 months.  Vallen was seen  today for right groin pain and possible hernia.  Diagnoses and all orders for this visit:  Hypertension associated with diabetes ([place]) -     POCT glucose (manual entry) -     POCT glycosylated hemoglobin (Hb A1C) -     Comprehensive metabolic panel -     canagliflozin (INVOKANA) 100 MG TABS tablet; Take 1 tablet (100 mg total) by mouth daily before breakfast.  Dyslipidemia associated with type 2 diabetes mellitus ([place])  Prostate cancer screening -     PSA  Nocturia -     PSA  Right inguinal hernia -     Ambulatory referral to General Surgery    Patient Instructions       If you have lab work done today you will be contacted with your lab results within the next 2 weeks.  If you have not heard from [place] then please contact us.  The fastest way to get your results is to register for My Chart.   IF you received an x-Chane today, you will receive an invoice from [facility]. Please contact [facility] at [phone] with questions or concerns regarding your invoice.   IF you received labwork today, you will receive an invoice from [facility]. Please contact [facility] at [phone] with questions or concerns regarding your invoice.   Our billing staff will not be able to assist you with questions regarding bills from these companies.  You will be contacted with the lab results as soon as they are available. The fastest way to get your results is to activate your My Chart account. Instructions are located on the last page of this paperwork. If you have not heard from [place] regarding the results in 2 weeks, please contact this office.      Inguinal Hernia, Adult An inguinal hernia is when fat or your intestines push through a weak spot in a muscle where your leg meets your lower belly (groin). This causes a rounded lump (bulge). This kind of hernia could also be:  In your scrotum, if you are male.  In folds of skin around your vagina, if you are male. There are  three types of inguinal hernias. These include:  Hernias that can be pushed back into the belly (are reducible). This type rarely causes pain.  Hernias that cannot be pushed back into the belly (are incarcerated).  Hernias that cannot be pushed back into the belly and lose their blood supply (are strangulated). This type needs emergency surgery. If you do not have symptoms, you may not need treatment. If you have symptoms or a large hernia, you may need surgery. Follow these instructions at home: Lifestyle  Do these things if told by your doctor so you do not have trouble pooping (constipation): ? Drink enough fluid to keep your pee (urine) pale yellow. ? Eat foods that have a lot of fiber. These include fresh fruits and vegetables, whole grains, and beans. ? Limit foods that are high in fat and processed sugars. These include foods that are fried or sweet. ? Take medicine for trouble pooping.  Avoid lifting heavy objects.  Avoid standing for long amounts of time.  Do not use any products that contain nicotine or tobacco. These include cigarettes and e-cigarettes. If you need help quitting, ask your doctor.  Stay at a healthy weight. General instructions  You may try to push your hernia in by very gently pressing on it when you are lying down. Do not try to force the bulge back in if it will not push in easily.  Watch your hernia for any changes in shape, size, or color. Tell your doctor if you see any changes.  Take over-the-counter and prescription medicines only as told by your doctor.  Keep all follow-up visits as told by your doctor. This is important. Contact a doctor if:  You have a fever.  You have new symptoms.  Your symptoms get worse. Get help right away if:  The area where your leg meets your lower belly has: ? Pain that gets worse suddenly. ? A bulge that gets bigger suddenly, and it does not get smaller after that. ? A bulge that turns red or purple. ? A  bulge that is painful when you touch it.  You are a man, and your scrotum: ? Suddenly feels painful. ? Suddenly changes in size.  You cannot push the hernia in by very gently pressing  on it when you are lying down. Do not try to force the bulge back in if it will not push in easily.  You feel sick to your stomach (nauseous), and that feeling does not go away.  You throw up (vomit), and that keeps happening.  You have a fast heartbeat.  You cannot poop (have a bowel movement) or pass gas. These symptoms may be an emergency. Do not wait to see if the symptoms will go away. Get medical help right away. Call your local emergency services (911 in the [place] Summary  An inguinal hernia is when fat or your intestines push through a weak spot in a muscle where your leg meets your lower belly (groin). This causes a rounded lump (bulge).  If you do not have symptoms, you may not need treatment. If you have symptoms or a large hernia, you may need surgery.  Avoid lifting heavy objects. Also avoid standing for long amounts of time.  Do not try to force the bulge back in if it will not push in easily. This information is not intended to replace advice given to you by your health care provider. Make sure you discuss any questions you have with your health care provider. Document Revised: 03/04/2017 Document Reviewed: 11/02/2016 Elsevier Patient Education  [address].  Diabetes Mellitus and Nutrition, Adult When you have diabetes (diabetes mellitus), it is very important to have healthy eating habits because your blood sugar (glucose) levels are greatly affected by what you eat and drink. Eating healthy foods in the appropriate amounts, at about the same times every day, can help you:  Control your blood glucose.  Lower your risk of heart disease.  Improve your blood pressure.  Reach or maintain a healthy weight. Every person with diabetes is different, and each person has different  needs for a meal plan. Your health care provider may recommend that you work with a diet and nutrition specialist (dietitian) to make a meal plan that is best for you. Your meal plan may vary depending on factors such as:  The calories you need.  The medicines you take.  Your weight.  Your blood glucose, blood pressure, and cholesterol levels.  Your activity level.  Other health conditions you have, such as heart or kidney disease. How do carbohydrates affect me? Carbohydrates, also called carbs, affect your blood glucose level more than any other type of food. Eating carbs naturally raises the amount of glucose in your blood. Carb counting is a method for keeping track of how many carbs you eat. Counting carbs is important to keep your blood glucose at a healthy level, especially if you use insulin or take certain oral diabetes medicines. It is important to know how many carbs you can safely have in each meal. This is different for every person. Your dietitian can help you calculate how many carbs you should have at each meal and for each snack. Foods that contain carbs include:  Bread, cereal, rice, pasta, and crackers.  Potatoes and corn.  Peas, beans, and lentils.  Milk and yogurt.  Fruit and juice.  Desserts, such as cakes, cookies, ice cream, and candy. How does alcohol affect me? Alcohol can cause a sudden decrease in blood glucose (hypoglycemia), especially if you use insulin or take certain oral diabetes medicines. Hypoglycemia can be a life-threatening condition. Symptoms of hypoglycemia (sleepiness, dizziness, and confusion) are similar to symptoms of having too much alcohol. If your health care provider says that alcohol is safe for  you, follow these guidelines:  Limit alcohol intake to no more than 1 drink per day for nonpregnant women and 2 drinks per day for men. One drink equals 12 oz of beer, 5 oz of wine, or 1 oz of hard liquor.  Do not drink on an empty  stomach.  Keep yourself hydrated with water, diet soda, or unsweetened iced tea.  Keep in mind that regular soda, juice, and other mixers may contain a lot of sugar and must be counted as carbs. What are tips for following this plan?  Reading food labels  Start by checking the serving size on the "Nutrition Facts" label of packaged foods and drinks. The amount of calories, carbs, fats, and other nutrients listed on the label is based on one serving of the item. Many items contain more than one serving per package.  Check the total grams (g) of carbs in one serving. You can calculate the number of servings of carbs in one serving by dividing the total carbs by 15. For example, if a food has 30 g of total carbs, it would be equal to 2 servings of carbs.  Check the number of grams (g) of saturated and trans fats in one serving. Choose foods that have low or no amount of these fats.  Check the number of milligrams (mg) of salt (sodium) in one serving. Most people should limit total sodium intake to less than 2,300 mg per day.  Always check the nutrition information of foods labeled as "low-fat" or "nonfat". These foods may be higher in added sugar or refined carbs and should be avoided.  Talk to your dietitian to identify your daily goals for nutrients listed on the label. Shopping  Avoid buying canned, premade, or processed foods. These foods tend to be high in fat, sodium, and added sugar.  Shop around the outside edge of the grocery store. This includes fresh fruits and vegetables, bulk grains, fresh meats, and fresh dairy. Cooking  Use low-heat cooking methods, such as baking, instead of high-heat cooking methods like deep frying.  Cook using healthy oils, such as olive, canola, or sunflower oil.  Avoid cooking with butter, cream, or high-fat meats. Meal planning  Eat meals and snacks regularly, preferably at the same times every day. Avoid going long periods of time without  eating.  Eat foods high in fiber, such as fresh fruits, vegetables, beans, and whole grains. Talk to your dietitian about how many servings of carbs you can eat at each meal.  Eat 4-6 ounces (oz) of lean protein each day, such as lean meat, chicken, fish, eggs, or tofu. One oz of lean protein is equal to: ? 1 oz of meat, chicken, or fish. ? 1 egg. ?  cup of tofu.  Eat some foods each day that contain healthy fats, such as avocado, nuts, seeds, and fish. Lifestyle  Check your blood glucose regularly.  Exercise regularly as told by your health care provider. This may include: ? 150 minutes of moderate-intensity or vigorous-intensity exercise each week. This could be brisk walking, biking, or water aerobics. ? Stretching and doing strength exercises, such as yoga or weightlifting, at least 2 times a week.  Take medicines as told by your health care provider.  Do not use any products that contain nicotine or tobacco, such as cigarettes and e-cigarettes. If you need help quitting, ask your health care provider.  Work with a Social worker or diabetes educator to identify strategies to manage stress and any emotional and social  challenges. Questions to ask a health care provider  Do I need to meet with a diabetes educator?  Do I need to meet with a dietitian?  What number can I call if I have questions?  When are the best times to check my blood glucose? Where to find more information:  American Diabetes Association: diabetes.org  Academy of Nutrition and Dietetics: www.eatright.[employer] of Diabetes and Digestive and Kidney Diseases (NIH): [employer] Summary  A healthy meal plan will help you control your blood glucose and maintain a healthy lifestyle.  Working with a diet and nutrition specialist (dietitian) can help you make a meal plan that is best for you.  Keep in mind that carbohydrates (carbs) and alcohol have immediate effects on your blood glucose  levels. It is important to count carbs and to use alcohol carefully. This information is not intended to replace advice given to you by your health care provider. Make sure you discuss any questions you have with your health care provider. Document Revised: 01/13/2017 Document Reviewed: 03/07/2016 Elsevier Patient Education  2020 Elsevier Inc.      Agustina Caroli, MD [facility]

## 2019-08-15 ENCOUNTER — Encounter: Payer: Self-pay | Admitting: Gastroenterology

## 2019-08-15 LAB — COMPREHENSIVE METABOLIC PANEL
ALT: 19 IU/L (ref 0–44)
AST: 17 IU/L (ref 0–40)
Albumin/Globulin Ratio: 2 (ref 1.2–2.2)
Albumin: 4.3 g/dL (ref 3.8–4.8)
Alkaline Phosphatase: 89 IU/L (ref 48–121)
BUN/Creatinine Ratio: 19 (ref 10–24)
BUN: 18 mg/dL (ref 8–27)
Bilirubin Total: 0.3 mg/dL (ref 0.0–1.2)
CO2: 20 mmol/L (ref 20–29)
Calcium: 9.4 mg/dL (ref 8.6–10.2)
Chloride: 100 mmol/L (ref 96–106)
Creatinine, Ser: 0.93 mg/dL (ref 0.76–1.27)
GFR calc Af Amer: 99 mL/min/{1.73_m2} (ref >59)
GFR calc non Af Amer: 85 mL/min/{1.73_m2} (ref >59)
Globulin, Total: 2.2 g/dL (ref 1.5–4.5)
Glucose: 134 mg/dL — ABNORMAL HIGH (ref 65–99)
Potassium: 4.3 mmol/L (ref 3.5–5.2)
Sodium: 139 mmol/L (ref 134–144)
Total Protein: 6.5 g/dL (ref 6.0–8.5)

## 2019-08-15 LAB — PSA: Prostate Specific Ag, Serum: 2 ng/mL (ref 0.0–4.0)

## 2019-08-17 ENCOUNTER — Encounter: Payer: Self-pay | Admitting: Emergency Medicine

## 2019-08-20 NOTE — Telephone Encounter (Signed)
Pt is waiting for his surgery consult and clearance apt but is now experiencing more pain and swelling than previously pt wants to know if theres anything he can do or that you could advise.

## 2019-08-20 NOTE — Telephone Encounter (Signed)
Pt notes that following palpation at his visit the area swelled up and it painful, notes he is swelling into his groin area as well, pt has tried ice and lidocaine patches with ibuprofen. But has no apt with surgery yet they have not called him yet pt is concerned that this referral needs to be urgent.

## 2019-08-20 NOTE — Telephone Encounter (Signed)
Find out when is surgery going to see him for evaluation.  Thanks.

## 2019-08-20 NOTE — Telephone Encounter (Signed)
Called and left a message for the referral coordinator [place] that this pt referral needs to be changed from standard to urgent due to worsening condition and left call back for details

## 2019-08-20 NOTE — Telephone Encounter (Signed)
Look into the general surgery referral and mark it as urgent please.  He may need to go to Emergency room if symptoms are becoming worse.

## 2019-08-21 ENCOUNTER — Encounter (HOSPITAL_COMMUNITY): Payer: Self-pay

## 2019-08-21 ENCOUNTER — Other Ambulatory Visit: Payer: Self-pay

## 2019-08-21 ENCOUNTER — Emergency Department (HOSPITAL_COMMUNITY)
Admission: EM | Admit: 2019-08-21 | Discharge: 2019-08-22 | Disposition: A | Discharge status: Home / Self Care | Payer: 59 | Attending: Emergency Medicine | Admitting: Emergency Medicine

## 2019-08-21 DIAGNOSIS — R10813 Right lower quadrant abdominal tenderness: Secondary | ICD-10-CM | POA: Diagnosis not present

## 2019-08-21 DIAGNOSIS — N189 Chronic kidney disease, unspecified: Secondary | ICD-10-CM | POA: Insufficient documentation

## 2019-08-21 DIAGNOSIS — Z87891 Personal history of nicotine dependence: Secondary | ICD-10-CM | POA: Insufficient documentation

## 2019-08-21 DIAGNOSIS — K4020 Bilateral inguinal hernia, without obstruction or gangrene, not specified as recurrent: Secondary | ICD-10-CM

## 2019-08-21 DIAGNOSIS — N50811 Right testicular pain: Secondary | ICD-10-CM | POA: Diagnosis present

## 2019-08-21 DIAGNOSIS — Z79899 Other long term (current) drug therapy: Secondary | ICD-10-CM | POA: Insufficient documentation

## 2019-08-21 DIAGNOSIS — F1210 Cannabis abuse, uncomplicated: Secondary | ICD-10-CM | POA: Insufficient documentation

## 2019-08-21 DIAGNOSIS — I2510 Atherosclerotic heart disease of native coronary artery without angina pectoris: Secondary | ICD-10-CM | POA: Diagnosis not present

## 2019-08-21 DIAGNOSIS — Z7984 Long term (current) use of oral hypoglycemic drugs: Secondary | ICD-10-CM | POA: Diagnosis not present

## 2019-08-21 DIAGNOSIS — E1122 Type 2 diabetes mellitus with diabetic chronic kidney disease: Secondary | ICD-10-CM | POA: Insufficient documentation

## 2019-08-21 DIAGNOSIS — E1140 Type 2 diabetes mellitus with diabetic neuropathy, unspecified: Secondary | ICD-10-CM | POA: Insufficient documentation

## 2019-08-21 DIAGNOSIS — I129 Hypertensive chronic kidney disease with stage 1 through stage 4 chronic kidney disease, or unspecified chronic kidney disease: Secondary | ICD-10-CM | POA: Diagnosis not present

## 2019-08-21 NOTE — ED Triage Notes (Signed)
Pt reports R sided scrotal and inguinal pain. States that his PCP checked it on Wednesday and "riled it up." He has a referral to [facility], but states that pain has worsened to where he cant walk right. No vomiting. No dysuria. No redness.

## 2019-08-22 ENCOUNTER — Emergency Department (HOSPITAL_COMMUNITY): Payer: 59

## 2019-08-22 ENCOUNTER — Encounter (HOSPITAL_COMMUNITY): Payer: Self-pay

## 2019-08-22 LAB — URINALYSIS, ROUTINE W REFLEX MICROSCOPIC
Bacteria, UA: NONE SEEN
Bilirubin Urine: NEGATIVE
Glucose, UA: >=500 mg/dL — AB
Hgb urine dipstick: NEGATIVE
Ketones, ur: NEGATIVE mg/dL
Leukocytes,Ua: NEGATIVE
Nitrite: NEGATIVE
Protein, ur: NEGATIVE mg/dL
Specific Gravity, Urine: 1.039 — ABNORMAL HIGH (ref 1.005–1.030)
pH: 5 (ref 5.0–8.0)

## 2019-08-22 LAB — BASIC METABOLIC PANEL
Anion gap: 12 (ref 5–15)
BUN: 24 mg/dL — ABNORMAL HIGH (ref 8–23)
CO2: 25 mmol/L (ref 22–32)
Calcium: 9.3 mg/dL (ref 8.9–10.3)
Chloride: 103 mmol/L (ref 98–111)
Creatinine, Ser: 0.93 mg/dL (ref 0.61–1.24)
GFR calc Af Amer: >60 mL/min (ref >60)
GFR calc non Af Amer: >60 mL/min (ref >60)
Glucose, Bld: 130 mg/dL — ABNORMAL HIGH (ref 70–99)
Potassium: 3.7 mmol/L (ref 3.5–5.1)
Sodium: 140 mmol/L (ref 135–145)

## 2019-08-22 LAB — CBC
HCT: 46.7 % (ref 39.0–52.0)
Hemoglobin: 15.5 g/dL (ref 13.0–17.0)
MCH: 29.7 pg (ref 26.0–34.0)
MCHC: 33.2 g/dL (ref 30.0–36.0)
MCV: 89.5 fL (ref 80.0–100.0)
Platelets: 345 10*3/uL (ref 150–400)
RBC: 5.22 MIL/uL (ref 4.22–5.81)
RDW: 13.3 % (ref 11.5–15.5)
WBC: 6.6 10*3/uL (ref 4.0–10.5)
nRBC: 0 % (ref 0.0–0.2)

## 2019-08-22 MED ORDER — IOHEXOL 300 MG/ML  SOLN
100.0000 mL | Freq: Once | INTRAMUSCULAR | Status: AC | PRN
  Administered 2019-08-22: 100 mL via INTRAVENOUS

## 2019-08-22 MED ORDER — MORPHINE SULFATE (PF) 4 MG/ML IV SOLN
4.0000 mg | Freq: Once | INTRAVENOUS | Status: AC
  Administered 2019-08-22: 4 mg via INTRAVENOUS
  Filled 2019-08-22: qty 1

## 2019-08-22 MED ORDER — SODIUM CHLORIDE (PF) 0.9 % IJ SOLN
INTRAMUSCULAR | Status: AC
  Filled 2019-08-22: qty 50

## 2019-08-22 NOTE — ED Provider Notes (Signed)
[facility] DEPT Provider Note   CSN: [id number] Arrival date & time: 08/21/19  2111     History Chief Complaint  Patient presents with  . Testicle Pain    Seth Pope is a 70 y.o. male.  HPI   Patient presented to the ED for evaluation of right-sided scrotal and inguinal pain.  Patient states he started noticing more inguinal and right scrotal pain sometime last week.  He went to see his primary care doctor who did an extensive exam and felt he had a right-sided inguinal hernia.  Patient states he was referred to [facility] surgery.  However over the last several days the pain has been increasing intensity.  He has to ice the area to help with the discomfort.  He has not had any vomiting.  No dysuria.  Has not noticed any redness but he does feel like it is swollen in his inguinal area.  Past Medical History:  Diagnosis Date  . Anxiety   . Arthritis    hands  . CAD (coronary artery disease)    a. cath 10/23/13: nonobstructive CAD, nl LV function, rec risk factor modification  . Chronic kidney disease    h/o kidney stone - passed stone  . Diabetes mellitus    type 2  . History of pneumonia   . Hyperlipidemia   . Hypertension   . Liver mass   . Sinus bradycardia    a. as low as 36 bpm; b. multiple pauses 2.1-2.3 seconds  . Ulcer   . Umbilical hernia     Patient Active Problem List   Diagnosis Date Noted  . Right inguinal hernia 08/14/2019  . History of shingles 02/28/2017  . Post herpetic neuralgia 02/28/2017  . Family history of coronary artery disease in father 10/23/2013  . Tobacco abuse 10/17/2013  . Unstable angina ([place]) 10/04/2013  . PVC's (premature ventricular contractions) 10/04/2013  . Neuropathy, diabetic ([place]) 01/31/2012  . Hypertension associated with diabetes ([place])   . Dyslipidemia associated with type 2 diabetes mellitus ([place])   . Hyperlipidemia     Past Surgical History:  Procedure Laterality Date  . BACK  SURGERY    . CERVICAL SPINE SURGERY    . COLONOSCOPY  2015   Dr Collene Mares 3 TAs, tics, hems  . KNEE SURGERY Right 1970  . LEFT HEART CATHETERIZATION WITH CORONARY ANGIOGRAM N/A 10/23/2013   Procedure: LEFT HEART CATHETERIZATION WITH CORONARY ANGIOGRAM;  Surgeon: Peter M [place], MD;  Location: [facility] CATH LAB;  Service: Cardiovascular;  Laterality: N/A;  . MICROLARYNGOSCOPY N/A 09/23/2015   Procedure: MICROLARYNGOSCOPY REMOVAL OF LESION;  Surgeon: Izora Gala, MD;  Location: [facility];  Service: ENT;  Laterality: N/A;  . NM [place]  08/2010   bruce myoview - normal perfusion in all regions, EF 66%, EKG negative for ischemia, no wall motion abnormalities, normal/low risk study                                       . ROTATOR CUFF REPAIR Bilateral 1991   x2  . UMBILICAL HERNIA REPAIR  1988       Family History  Problem Relation Age of Onset  . Heart attack Father   . Hypertension Father   . Heart disease Father   . Diabetes Sister        HTN  . Diabetes Brother  HTN, heart problems  . Hypertension Other   . Diabetes Paternal Grandmother   . Heart disease Paternal Grandfather   . Colon cancer Neg Hx   . Rectal cancer Neg Hx   . Stomach cancer Neg Hx     Social History   Tobacco Use  . Smoking status: Former Smoker    Packs/day: 2.50    Years: 18.00    Pack years: 45.00    Types: Cigarettes    Quit date: 07/16/1986    Years since quitting: 33.1  . Smokeless tobacco: Never Used  Vaping Use  . Vaping Use: Never used  Substance Use Topics  . Alcohol use: Not Currently  . Drug use: Yes    Types: Marijuana    Comment: Last use May 2021    Home Medications Prior to Admission medications   Medication Sig Start Date End Date Taking? Authorizing Provider  ALPRAZolam (XANAX XR) 1 MG 24 hr tablet Take 1 mg by mouth daily.    [provider]  ALPRAZolam Duanne Moron) 0.25 MG tablet TK ONE T PO PRN D 03/16/16   [provider]  atorvastatin (LIPITOR) 80 MG tablet  Take 1 tablet (80 mg total) by mouth daily. 06/04/19   Horald Pollen, MD  canagliflozin [facility]) 100 MG TABS tablet Take 1 tablet (100 mg total) by mouth daily before breakfast. 08/14/19 11/12/19  Horald Pollen, MD  desipramine (NORPRAMIN) 50 MG tablet Take 150 mg by mouth at bedtime.     [provider]  gabapentin (NEURONTIN) 300 MG capsule TAKE 2 CAPSULES(600 MG) BY MOUTH TWICE DAILY 07/29/19   Sagardia, Ines Bloomer, MD  glucose blood (ONETOUCH VERIO) test strip USE TO CHECK BLOOD SUGAR UP TO THREE TIMES DAILY. 07/23/19   Horald Pollen, MD  hydrochlorothiazide (MICROZIDE) 12.5 MG capsule TAKE 1 CAPSULE(12.5 MG) BY MOUTH DAILY 06/13/19   Horald Pollen, MD  lidocaine (LIDODERM) 5 % Place 1 patch onto the skin daily. Remove & Discard patch within 12 hours or as directed by MD 06/28/19   Mauri Pole, MD  linagliptin (TRADJENTA) 5 MG TABS tablet TAKE 1 TABLET(5 MG) BY MOUTH DAILY Patient taking differently: at bedtime. TAKE 1 TABLET(5 MG) BY MOUTH DAILY 06/26/19   Horald Pollen, MD  lisinopril (ZESTRIL) 10 MG tablet Take 1 tablet (10 mg total) by mouth daily. 11/20/18 07/18/19  Horald Pollen, MD  metFORMIN (GLUCOPHAGE-XR) 500 MG 24 hr tablet Take 2 tablets (1,000 mg total) by mouth in the morning and at bedtime. 06/04/19 09/02/19  Horald Pollen, MD  metoprolol tartrate (LOPRESSOR) 100 MG tablet TAKE 1 TABLET(100 MG) BY MOUTH TWICE DAILY 06/13/19   Horald Pollen, MD  [facility] DELICA LANCETS FINE MISC USE AS DIRECTED TO CHECK BLOOD GLUCOSE UP TO THREE TIMES DAILY 12/14/15   Wardell Honour, MD    Allergies    Penicillins  Review of Systems   Review of Systems  All other systems reviewed and are negative.   Physical Exam Updated Vital Signs BP 117/66 (BP Location: Left Arm)   Pulse 68   Temp 98.3 F (36.8 C) (Oral)   Resp 15   Ht 1.778 m (5\' 10" )   Wt 77.1 kg   SpO2 98%   BMI 24.39 kg/m   Physical Exam Vitals and  nursing note reviewed.  Constitutional:      General: He is not in acute distress.    Appearance: He is well-developed.  HENT:  Head: Normocephalic and atraumatic.     Right Ear: External ear normal.     Left Ear: External ear normal.  Eyes:     General: No scleral icterus.       Right eye: No discharge.        Left eye: No discharge.     Conjunctiva/sclera: Conjunctivae normal.  Neck:     Trachea: No tracheal deviation.  Cardiovascular:     Rate and Rhythm: Normal rate and regular rhythm.  Pulmonary:     Effort: Pulmonary effort is normal. No respiratory distress.     Breath sounds: Normal breath sounds. No stridor. No wheezing or rales.  Abdominal:     General: Bowel sounds are normal. There is no distension.     Palpations: Abdomen is soft.     Tenderness: There is no abdominal tenderness. There is no guarding or rebound.     Comments: Tenderness palpation right inguinal area, no definite hernia appreciated on exam  Genitourinary:    Penis: Normal.      Testes: Normal.  Musculoskeletal:        General: No tenderness.     Cervical back: Neck supple.  Skin:    General: Skin is warm and dry.     Findings: No rash.  Neurological:     Mental Status: He is alert.     Cranial Nerves: No cranial nerve deficit (no facial droop, extraocular movements intact, no slurred speech).     Sensory: No sensory deficit.     Motor: No abnormal muscle tone or seizure activity.     Coordination: Coordination normal.     ED Results / Procedures / Treatments   Labs (all labs ordered are listed, but only abnormal results are displayed) Labs Reviewed  CBC  BASIC METABOLIC PANEL  URINALYSIS, ROUTINE W REFLEX MICROSCOPIC    EKG None  Radiology No results found.  Procedures Procedures (including critical care time)  Medications Ordered in ED Medications  morphine 4 MG/ML injection 4 mg (has no administration in time range)    ED Course  I have reviewed the triage vital  signs and the nursing notes.  Pertinent labs & imaging results that were available during my care of the patient were reviewed by me and considered in my medical decision making (see chart for details).    MDM Rules/Calculators/A&P                         Patient has significant tenderness in the right inguinal area on exam.  No scrotal swelling noted.  Patient was diagnosed with an inguinal hernia.  He called the surgeon's office this evening because of the increasing pain and they were concerned he might have an incarcerated hernia.  Plan on CT scan for further evaluation as I do not find a definite hernia on my exam. Care turned over to Dr Florina Ou.   Final Clinical Impression(s) / ED Diagnoses Final diagnoses:  Right inguinal pain      Dorie Rank, MD 08/22/19 0025

## 2019-08-22 NOTE — Telephone Encounter (Signed)
Thank you for the follow up. Good job.

## 2019-08-22 NOTE — Telephone Encounter (Signed)
FYI: Called and checked in with pt to see if he had been contacted about referral to general surgery. Pt was in ED last night for pain with Hernia, pt notes he had a CT scan and it showed two hernias one on each side of his abdomen, pt then also had a consult this morning with the surgeon and was able to discuss treatment plan, surgery is not scheduled yet but surgeon told the pt that it should be soon as it is considered "Urgent". Pt asked me to thank you.

## 2019-08-22 NOTE — ED Provider Notes (Addendum)
Nursing notes and vitals signs, including pulse oximetry, reviewed.  Summary of this visit's results, reviewed by myself:  EKG:  EKG Interpretation  Date/Time:    Ventricular Rate:    PR Interval:    QRS Duration:   QT Interval:    QTC Calculation:   R Axis:     Text Interpretation:         Labs:  Results for orders placed or performed during the hospital encounter of 08/21/19 (from the past 24 hour(s))  CBC     Status: None   Collection Time: 08/22/19 12:08 AM  Result Value Ref Range   WBC 6.6 4.0 - 10.5 K/uL   RBC 5.22 4.22 - 5.81 MIL/uL   Hemoglobin 15.5 13.0 - 17.0 g/dL   HCT 46.7 39 - 52 %   MCV 89.5 80.0 - 100.0 fL   MCH 29.7 26.0 - 34.0 pg   MCHC 33.2 30.0 - 36.0 g/dL   RDW 13.3 11.5 - 15.5 %   Platelets 345 150 - 400 K/uL   nRBC 0.0 0.0 - 0.2 %  Basic metabolic panel     Status: Abnormal   Collection Time: 08/22/19 12:08 AM  Result Value Ref Range   Sodium 140 135 - 145 mmol/L   Potassium 3.7 3.5 - 5.1 mmol/L   Chloride 103 98 - 111 mmol/L   CO2 25 22 - 32 mmol/L   Glucose, Bld 130 (H) 70 - 99 mg/dL   BUN 24 (H) 8 - 23 mg/dL   Creatinine, Ser 0.93 0.61 - 1.24 mg/dL   Calcium 9.3 8.9 - 10.3 mg/dL   GFR calc non Af Amer >60 >60 mL/min   GFR calc Af Amer >60 >60 mL/min   Anion gap 12 5 - 15  Urinalysis, Routine w reflex microscopic     Status: Abnormal   Collection Time: 08/22/19  3:00 AM  Result Value Ref Range   Color, Urine STRAW (A) YELLOW   APPearance CLEAR CLEAR   Specific Gravity, Urine 1.039 (H) 1.005 - 1.030   pH 5.0 5.0 - 8.0   Glucose, UA >=500 (A) NEGATIVE mg/dL   Hgb urine dipstick NEGATIVE NEGATIVE   Bilirubin Urine NEGATIVE NEGATIVE   Ketones, ur NEGATIVE NEGATIVE mg/dL   Protein, ur NEGATIVE NEGATIVE mg/dL   Nitrite NEGATIVE NEGATIVE   Leukocytes,Ua NEGATIVE NEGATIVE   RBC / HPF 0-5 0 - 5 RBC/hpf   WBC, UA 0-5 0 - 5 WBC/hpf   Bacteria, UA NONE SEEN NONE SEEN    Imaging Studies: CT ABDOMEN PELVIS W CONTRAST  Result Date:  08/22/2019 CLINICAL DATA:  Abdominal pain, hernia suspected, right inguinal and scrotal pain EXAM: CT ABDOMEN AND PELVIS WITH CONTRAST TECHNIQUE: Multidetector CT imaging of the abdomen and pelvis was performed using the standard protocol following bolus administration of intravenous contrast. CONTRAST:  12mL OMNIPAQUE IOHEXOL 300 MG/ML  SOLN COMPARISON:  CT 07/18/2009, ultrasound 06/19/2019, MRI abdomen 07/19/2019 FINDINGS: Lower chest: Lung bases are clear. Normal heart size. No pericardial effusion. Hepatobiliary: Few scattered subcentimeter hypoattenuating foci in the liver too small to fully characterize on CT imaging but statistically likely benign. Normal hepatic attenuation. Smooth surface contour. No worrisome liver lesions. Normal gallbladder and biliary tree. No visible calcified intraductal gallstones. Pancreas: Unremarkable. No pancreatic ductal dilatation or surrounding inflammatory changes. Spleen: Normal in size without focal abnormality. Adrenals/Urinary Tract: Normal adrenal glands. Kidneys enhance and excrete symmetrically. No worrisome renal lesions. No urolithiasis or hydronephrosis. Mild symmetric bilateral perinephric stranding, nonspecific though similar to  comparison from 2011. Circumferential bladder wall thickening is greater than expected for the degree of underdistention. No bladder debris or calculi. Stomach/Bowel: Distal esophagus, stomach and duodenal sweep are unremarkable. No small bowel wall thickening or dilatation. No evidence of obstruction. A normal appendix is visualized. Scattered colonic diverticula without focal inflammation to suggest diverticulitis. Vascular/Lymphatic: Atherosclerotic calcifications within the abdominal aorta and branch vessels. No aneurysm or ectasia. No enlarged abdominopelvic lymph nodes. Reproductive: Borderline prostatomegaly. No concerning nodules or masses. Calcification of the ductus deferens, often seen with diabetes. Other: Small fat containing  inguinal hernias seen bilaterally. No evidence of strangulation. No bowel containing hernia. No abdominopelvic free air or fluid. Musculoskeletal: Prior L4-5 posterior fusion and decompression with interbody spacer placement. Hardware intact without complication or significant subsidence. Discogenic change at L2-3 is advanced from prior with some sclerotic, likely Modic type endplate changes. Additional discogenic changes elsewhere in the spine. Mild-to-moderate degenerative changes in the hips and pelvis. No acute osseous abnormality or suspicious osseous lesion. IMPRESSION: 1. Small fat containing inguinal hernias bilaterally. No bowel containing hernia nor evidence of strangulation. 2. Circumferential bladder wall thickening is greater than expected for the degree of underdistention. Correlate with urinalysis to exclude cystitis. Borderline prostatomegaly with indentation of the bladder base, could assess for outlet obstruction as well. 3. Mild bilateral perinephric stranding is nonspecific but similar to prior from 2011, most likely related to senescent change or diminished renal function rather than infection. 4. Prior L4-5 fusion without complication. 5. Aortic Atherosclerosis (ICD10-I70.0). Electronically Signed   By: Lovena Le M.D.   On: 08/22/2019 03:09   3:32 AM Patient advised of CT findings.  Urine is grossly clear with no visible signs of infection.  We will send for urinalysis but the patient would prefer to leave at this time as he has follow-up with Dr. Kaylyn Lim at [facility] surgery at [place] AM.    Florina Ou, Jenny Reichmann, MD 08/22/19 0335    Gail Creekmore, Jenny Reichmann, MD 08/22/19 [phone]

## 2019-08-26 ENCOUNTER — Other Ambulatory Visit: Payer: Self-pay | Admitting: Emergency Medicine

## 2019-08-26 MED ORDER — EMPAGLIFLOZIN 10 MG PO TABS: 10.0000 mg | ORAL_TABLET | Freq: Every day | ORAL | 3 refills | Status: AC

## 2019-09-10 NOTE — Patient Instructions (Addendum)
DUE TO COVID-19 ONLY ONE VISITOR IS ALLOWED TO COME WITH YOU AND STAY IN THE WAITING ROOM ONLY DURING PRE OP AND PROCEDURE DAY OF SURGERY. THE 1 VISITOR MAY VISIT WITH YOU AFTER SURGERY IN YOUR PRIVATE ROOM DURING VISITING HOURS ONLY!  YOU NEED TO HAVE A COVID 19 TEST ON: 09/12/19 @  9:00 am , THIS TEST MUST BE DONE BEFORE SURGERY, COME  [place], [address] , [id number].  ([place]) ONCE YOUR COVID TEST IS COMPLETED, PLEASE BEGIN THE QUARANTINE INSTRUCTIONS AS OUTLINED IN YOUR HANDOUT.                Seth Pope    Your procedure is scheduled on: 09/16/19   Report to [facility] Main  Entrance   Report to admitting at: 9:30 AM     Call this number if you have problems the morning of surgery [phone]    Remember: Do not eat solid food :After Midnight. Clear liquids from midnight until 8:30 am.   CLEAR LIQUID DIET   Foods Allowed                                                                     Foods Excluded  Coffee and tea, regular and decaf                             liquids that you cannot  Plain Jell-O any favor except red or purple                                           see through such as: Fruit ices (not with fruit pulp)                                     milk, soups, orange juice  Iced Popsicles                                    All solid food Carbonated beverages, regular and diet                                    Cranberry, grape and apple juices Sports drinks like Gatorade Lightly seasoned clear broth or consume(fat free) Sugar, honey syrup  Sample Menu Breakfast                                Lunch                                     Supper Cranberry juice                    Beef broth  Chicken broth Jell-O                                     Grape juice                           Apple juice Coffee or tea                        Jell-O                                      Popsicle                                                 Coffee or tea                        Coffee or tea  _____________________________________________________________________  BRUSH YOUR TEETH MORNING OF SURGERY AND RINSE YOUR MOUTH OUT, NO CHEWING GUM CANDY OR MINTS.     Take these medicines the morning of surgery with A SIP OF WATER: gabapentin,metoprolol.  How to Manage Your Diabetes Before and After Surgery  Why is it important to control my blood sugar before and after surgery? . Improving blood sugar levels before and after surgery helps healing and can limit problems. . A way of improving blood sugar control is eating a healthy diet by: o  Eating less sugar and carbohydrates o  Increasing activity/exercise o  Talking with your doctor about reaching your blood sugar goals . High blood sugars (greater than 180 mg/dL) can raise your risk of infections and slow your recovery, so you will need to focus on controlling your diabetes during the weeks before surgery. . Make sure that the doctor who takes care of your diabetes knows about your planned surgery including the date and location.  How do I manage my blood sugar before surgery? . Check your blood sugar at least 4 times a day, starting 2 days before surgery, to make sure that the level is not too high or low. o Check your blood sugar the morning of your surgery when you wake up and every 2 hours until you get to the Short Stay unit. . If your blood sugar is less than 70 mg/dL, you will need to treat for low blood sugar: o Do not take insulin. o Treat a low blood sugar (less than 70 mg/dL) with  cup of clear juice (cranberry or apple), 4 glucose tablets, OR glucose gel. o Recheck blood sugar in 15 minutes after treatment (to make sure it is greater than 70 mg/dL). If your blood sugar is not greater than 70 mg/dL on recheck, call [phone] for further instructions. . Report your blood sugar to the short stay nurse when you get to Short Stay.  . If  you are admitted to the hospital after surgery: o Your blood sugar will be checked by the staff and you will probably be given insulin after surgery (instead of oral diabetes medicines) to make sure you have good blood sugar levels. o The goal for blood sugar control after surgery is 80-180 mg/dL.  WHAT DO I DO ABOUT MY DIABETES MEDICATION?  Marland Kitchen Do not take oral diabetes medicines (pills) the morning of surgery.  . THE DAY BEFORE SURGERY, take  trajenta and metformin as usual.DO NOT take jardiance.         . THE MORNING OF SURGERY, take  DO NOT take any diabetes medicine.   DO NOT TAKE ANY DIABETIC MEDICATIONS DAY OF YOUR SURGERY                               You may not have any metal on your body including hair pins and              piercings  Do not wear jewelry, lotions, powders or perfumes, deodorant             Men may shave face and neck.   Do not bring valuables to the hospital. [place].  Contacts, dentures or bridgework may not be worn into surgery.  Leave suitcase in the car. After surgery it may be brought to your room.     Patients discharged the day of surgery will not be allowed to drive home. IF YOU ARE HAVING SURGERY AND GOING HOME THE SAME DAY, YOU MUST HAVE AN ADULT TO DRIVE YOU HOME AND BE WITH YOU FOR 24 HOURS. YOU MAY GO HOME BY TAXI OR UBER OR ORTHERWISE, BUT AN ADULT MUST ACCOMPANY YOU HOME AND STAY WITH YOU FOR 24 HOURS.  Name and phone number of your driver:  Special Instructions: N/A              Please read over the following fact sheets you were given: _____________________________________________________________________          [facility] - Preparing for Surgery Before surgery, you can play an important role.  Because skin is not sterile, your skin needs to be as free of germs as possible.  You can reduce the number of germs on your skin by washing with CHG (chlorahexidine gluconate) soap before  surgery.  CHG is an antiseptic cleaner which kills germs and bonds with the skin to continue killing germs even after washing. Please DO NOT use if you have an allergy to CHG or antibacterial soaps.  If your skin becomes reddened/irritated stop using the CHG and inform your nurse when you arrive at Short Stay. Do not shave (including legs and underarms) for at least 48 hours prior to the first CHG shower.  You may shave your face/neck. Please follow these instructions carefully:  1.  Shower with CHG Soap the night before surgery and the  morning of Surgery.  2.  If you choose to wash your hair, wash your hair first as usual with your  normal  shampoo.  3.  After you shampoo, rinse your hair and body thoroughly to remove the  shampoo.                           4.  Use CHG as you would any other liquid soap.  You can apply chg directly  to the skin and wash                       Gently with a scrungie or clean washcloth.  5.  Apply the CHG  Soap to your body ONLY FROM THE NECK DOWN.   Do not use on face/ open                           Wound or open sores. Avoid contact with eyes, ears mouth and genitals (private parts).                       Wash face,  Genitals (private parts) with your normal soap.             6.  Wash thoroughly, paying special attention to the area where your surgery  will be performed.  7.  Thoroughly rinse your body with warm water from the neck down.  8.  DO NOT shower/wash with your normal soap after using and rinsing off  the CHG Soap.                9.  Pat yourself dry with a clean towel.            10.  Wear clean pajamas.            11.  Place clean sheets on your bed the night of your first shower and do not  sleep with pets. Day of Surgery : Do not apply any lotions/deodorants the morning of surgery.  Please wear clean clothes to the hospital/surgery center.  FAILURE TO FOLLOW THESE INSTRUCTIONS MAY RESULT IN THE CANCELLATION OF YOUR SURGERY PATIENT  SIGNATURE_________________________________  NURSE SIGNATURE__________________________________  ________________________________________________________________________

## 2019-09-10 NOTE — Progress Notes (Signed)
Pt. Needs orders for the upcomming surgery.PST appointment on 09/11/19.Thank you.

## 2019-09-11 ENCOUNTER — Encounter (HOSPITAL_COMMUNITY): Payer: Self-pay

## 2019-09-11 ENCOUNTER — Other Ambulatory Visit: Payer: Self-pay

## 2019-09-11 ENCOUNTER — Encounter (HOSPITAL_COMMUNITY)
Admission: RE | Admit: 2019-09-11 | Discharge: 2019-09-11 | Disposition: A | Discharge status: Home / Self Care | Payer: 59 | Source: Ambulatory Visit | Attending: Surgery | Admitting: Surgery

## 2019-09-11 DIAGNOSIS — Z01818 Encounter for other preprocedural examination: Secondary | ICD-10-CM | POA: Diagnosis not present

## 2019-09-11 LAB — GLUCOSE, CAPILLARY: Glucose-Capillary: 122 mg/dL — ABNORMAL HIGH (ref 70–99)

## 2019-09-11 LAB — CBC
HCT: 50.3 % (ref 39.0–52.0)
Hemoglobin: 16.4 g/dL (ref 13.0–17.0)
MCH: 29.5 pg (ref 26.0–34.0)
MCHC: 32.6 g/dL (ref 30.0–36.0)
MCV: 90.5 fL (ref 80.0–100.0)
Platelets: 287 10*3/uL (ref 150–400)
RBC: 5.56 MIL/uL (ref 4.22–5.81)
RDW: 13.7 % (ref 11.5–15.5)
WBC: 6.4 10*3/uL (ref 4.0–10.5)
nRBC: 0 % (ref 0.0–0.2)

## 2019-09-11 LAB — BASIC METABOLIC PANEL
Anion gap: 12 (ref 5–15)
BUN: 12 mg/dL (ref 8–23)
CO2: 23 mmol/L (ref 22–32)
Calcium: 9.2 mg/dL (ref 8.9–10.3)
Chloride: 103 mmol/L (ref 98–111)
Creatinine, Ser: 0.7 mg/dL (ref 0.61–1.24)
GFR calc Af Amer: >60 mL/min (ref >60)
GFR calc non Af Amer: >60 mL/min (ref >60)
Glucose, Bld: 123 mg/dL — ABNORMAL HIGH (ref 70–99)
Potassium: 4.2 mmol/L (ref 3.5–5.1)
Sodium: 138 mmol/L (ref 135–145)

## 2019-09-11 NOTE — Progress Notes (Addendum)
COVID Vaccine Completed:yes Date COVID Vaccine completed:04/07/19 COVID vaccine manufacturer: *[employer]   PCP - Dr.Miguel Sagardia. LOV: 08/14/19 Cardiologist - No  Chest x-Emoni -  EKG -  Stress Test -  ECHO -  Cardiac Cath -  A1C: 6.6: 08/14/19 Sleep Study -  CPAP -   Fasting Blood Sugar -  Checks Blood Sugar _____ times a day  Blood Thinner Instructions: Aspirin Instructions: Last Dose:  Anesthesia review: Hx: HTN,DIA,CAD. Pt. Is very active, he usually walks a mile or more every day.His activity levels has been limited for the pain due to the hernias.  Patient denies shortness of breath, fever, cough and chest pain at PAT appointment   Patient verbalized understanding of instructions that were given to them at the PAT appointment. Patient was also instructed that they will need to review over the PAT instructions again at home before surgery.

## 2019-09-12 ENCOUNTER — Other Ambulatory Visit (HOSPITAL_COMMUNITY)
Admission: RE | Admit: 2019-09-12 | Discharge: 2019-09-12 | Disposition: A | Discharge status: Home / Self Care | Payer: 59 | Source: Ambulatory Visit | Attending: Surgery | Admitting: Surgery

## 2019-09-12 DIAGNOSIS — Z01812 Encounter for preprocedural laboratory examination: Secondary | ICD-10-CM | POA: Diagnosis present

## 2019-09-12 DIAGNOSIS — Z20822 Contact with and (suspected) exposure to covid-19: Secondary | ICD-10-CM | POA: Insufficient documentation

## 2019-09-12 LAB — SARS CORONAVIRUS 2 (TAT 6-24 HRS): SARS Coronavirus 2: NEGATIVE

## 2019-09-12 NOTE — Progress Notes (Signed)
Anesthesia Chart Review   Case: [id number] Date/Time: 09/16/19 1115   Procedure: LAPAROSCOPIC BILATERAL INGUINAL HERNIA (Bilateral )   Anesthesia type: General   Pre-op diagnosis: BILATERAL INGUINAL HERNIA   Location: [facility] / [facility]   Surgeons: Johnathan Hausen, MD      DISCUSSION:69 y.o. 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 Johnathan Hausen.   Evaluated by cardiology in 2015 due to chest pain.  Low risk stress test at this time, no significant CAD on cath.  No recurrence.  Reports to PAT nurse he is very physical activity, only limited recently due to pain associated with hernia.    Last seen by PCP 08/14/2019.  Per OV note HTN well controlled, diabetes improved with most recent A1C 6.6.    Anticipate pt can proceed with planned procedure barring acute status change.   VS: BP (!) 132/80   Pulse 64   Temp 36.7 C (Oral)   Resp 18   Ht 5\' 10"  (1.778 m)   Wt 77.2 kg   SpO2 99%   BMI 24.42 kg/m   PROVIDERS: Horald Pollen, MD is PCP    LABS: Labs reviewed: Acceptable for surgery. (all labs ordered are listed, but only abnormal results are displayed)  Labs Reviewed  BASIC METABOLIC PANEL - Abnormal; Notable for the following components:      Result Value   Glucose, Bld 123 (*)    All other components within normal limits  GLUCOSE, CAPILLARY - Abnormal; Notable for the following components:   Glucose-Capillary 122 (*)    All other components within normal limits  CBC     IMAGES:   EKG: 09/11/2019 Rate 52 bpm  Sinus bradycardia  LAFB  CV: Myocardial Perfusion 10/08/2013 Overall Impression:  Low risk stress nuclear study with moderate size, medium intensity, fixed inferior and apical defects consistent with thinnig; no ischemia. Note patient did complain of chest pain with exercise and SBP decreased from  159 in stage 2 to 119 in stage 3.  Past Medical History:  Diagnosis Date  . Anxiety   . Arthritis    hands  . CAD (coronary artery disease)    a. cath 10/23/13: nonobstructive CAD, nl LV function, rec risk factor modification  . Chronic kidney disease    h/o kidney stone - passed stone  . Diabetes mellitus    type 2  . History of pneumonia   . Hyperlipidemia   . Hypertension   . Liver mass   . Sinus bradycardia    a. as low as 36 bpm; b. multiple pauses 2.1-2.3 seconds  . Ulcer   . Umbilical hernia     Past Surgical History:  Procedure Laterality Date  . BACK SURGERY    . CERVICAL SPINE SURGERY    . COLONOSCOPY  2015   Dr Collene Mares 3 TAs, tics, hems  . KNEE SURGERY Right 1970  . LEFT HEART CATHETERIZATION WITH CORONARY ANGIOGRAM N/A 10/23/2013   Procedure: LEFT HEART CATHETERIZATION WITH CORONARY ANGIOGRAM;  Surgeon: Peter M Martinique, MD;  Location: [facility] CATH LAB;  Service: Cardiovascular;  Laterality: N/A;  . MICROLARYNGOSCOPY N/A 09/23/2015   Procedure: MICROLARYNGOSCOPY REMOVAL OF LESION;  Surgeon: Izora Gala, MD;  Location: [place];  Service: ENT;  Laterality: N/A;  . NM MYOCAR PERF WALL MOTION  08/2010   bruce myoview - normal perfusion in all regions,  EF 66%, EKG negative for ischemia, no wall motion abnormalities, normal/low risk study                                       . ROTATOR CUFF REPAIR Bilateral 1991   x2  . UMBILICAL HERNIA REPAIR  1988    MEDICATIONS: . ALPRAZolam (XANAX XR) 1 MG 24 hr tablet  . ALPRAZolam (XANAX) 0.25 MG tablet  . atorvastatin (LIPITOR) 80 MG tablet  . dapagliflozin propanediol (FARXIGA) 5 MG TABS tablet  . desipramine (NORPRAMIN) 50 MG tablet  . empagliflozin (JARDIANCE) 10 MG TABS tablet  . gabapentin (NEURONTIN) 300 MG capsule  . glucose blood (ONETOUCH VERIO) test strip  . hydrochlorothiazide (MICROZIDE) 12.5 MG capsule  . lidocaine (LIDODERM) 5 %  . linagliptin (TRADJENTA) 5 MG TABS tablet  . lisinopril (ZESTRIL) 10 MG tablet  . metFORMIN  (GLUCOPHAGE-XR) 500 MG 24 hr tablet  . metoprolol tartrate (LOPRESSOR) 100 MG tablet  . ONETOUCH DELICA LANCETS FINE MISC   No current facility-administered medications for this encounter.    Konrad Felix, PA-C [facility] [phone]

## 2019-09-13 NOTE — H&P (Signed)
Chief Complaint:  Bilateral inguinal herniae - right more painful  History of Present Illness:  Seth Pope is an 69 y.o. male who was seen in the ER and referred by [facility] for hernia repair.  He was seen in the ER where a CT showed bilateral herniae containing mainly fat.  We discussed lap and open inguinal hernia and will plan lap TEP.    Past Medical History:  Diagnosis Date  . Anxiety   . Arthritis    hands  . CAD (coronary artery disease)    a. cath 10/23/13: nonobstructive CAD, nl LV function, rec risk factor modification  . Chronic kidney disease    h/o kidney stone - passed stone  . Diabetes mellitus    type 2  . History of pneumonia   . Hyperlipidemia   . Hypertension   . Liver mass   . Sinus bradycardia    a. as low as 36 bpm; b. multiple pauses 2.1-2.3 seconds  . Ulcer   . Umbilical hernia     Past Surgical History:  Procedure Laterality Date  . BACK SURGERY    . CERVICAL SPINE SURGERY    . COLONOSCOPY  2015   Dr Collene Mares 3 TAs, tics, hems  . KNEE SURGERY Right 1970  . LEFT HEART CATHETERIZATION WITH CORONARY ANGIOGRAM N/A 10/23/2013   Procedure: LEFT HEART CATHETERIZATION WITH CORONARY ANGIOGRAM;  Surgeon: Peter M Martinique, MD;  Location: [facility] CATH LAB;  Service: Cardiovascular;  Laterality: N/A;  . MICROLARYNGOSCOPY N/A 09/23/2015   Procedure: MICROLARYNGOSCOPY REMOVAL OF LESION;  Surgeon: Izora Gala, MD;  Location: [place];  Service: ENT;  Laterality: N/A;  . NM [place]  08/2010   bruce myoview - normal perfusion in all regions, EF 66%, EKG negative for ischemia, no wall motion abnormalities, normal/low risk study                                       . ROTATOR CUFF REPAIR Bilateral 1991   x2  . UMBILICAL HERNIA REPAIR  1988    No current facility-administered medications for this encounter.   Current Outpatient Medications  Medication Sig Dispense Refill  . ALPRAZolam (XANAX XR) 1 MG 24 hr tablet Take 1 mg by mouth daily.    Marland Kitchen ALPRAZolam (XANAX)  0.25 MG tablet Take 0.25 mg by mouth 2 (two) times daily as needed for anxiety.   0  . atorvastatin (LIPITOR) 80 MG tablet Take 1 tablet (80 mg total) by mouth daily. 90 tablet 1  . dapagliflozin propanediol (FARXIGA) 5 MG TABS tablet Take 5 mg by mouth daily.    Marland Kitchen desipramine (NORPRAMIN) 50 MG tablet Take 150 mg by mouth at bedtime.     . gabapentin (NEURONTIN) 300 MG capsule TAKE 2 CAPSULES(600 MG) BY MOUTH TWICE DAILY (Patient taking differently: Take 300 mg by mouth 3 (three) times daily. ) 360 capsule 0  . hydrochlorothiazide (MICROZIDE) 12.5 MG capsule TAKE 1 CAPSULE(12.5 MG) BY MOUTH DAILY (Patient taking differently: Take 12.5 mg by mouth daily. TAKE 1 CAPSULE(12.5 MG) BY MOUTH DAILY) 90 capsule 1  . lidocaine (LIDODERM) 5 % Place 1 patch onto the skin daily. Remove & Discard patch within 12 hours or as directed by MD (Patient taking differently: Place 0.5 patches onto the skin daily as needed (pain). Remove & Discard patch within 12 hours or as directed by MD) 30 patch 0  .  linagliptin (TRADJENTA) 5 MG TABS tablet TAKE 1 TABLET(5 MG) BY MOUTH DAILY (Patient taking differently: Take 5 mg by mouth at bedtime. TAKE 1 TABLET(5 MG) BY MOUTH DAILY) 30 tablet 5  . lisinopril (ZESTRIL) 10 MG tablet Take 10 mg by mouth daily.    . metFORMIN (GLUCOPHAGE-XR) 500 MG 24 hr tablet Take 2 tablets (1,000 mg total) by mouth in the morning and at bedtime. 360 tablet 1  . metoprolol tartrate (LOPRESSOR) 100 MG tablet TAKE 1 TABLET(100 MG) BY MOUTH TWICE DAILY (Patient taking differently: Take 100 mg by mouth 2 (two) times daily. ) 180 tablet 1  . empagliflozin (JARDIANCE) 10 MG TABS tablet Take 1 tablet (10 mg total) by mouth daily before breakfast. 90 tablet 3  . glucose blood (ONETOUCH VERIO) test strip USE TO CHECK BLOOD SUGAR UP TO THREE TIMES DAILY. 100 each 0  . ONETOUCH DELICA LANCETS FINE MISC USE AS DIRECTED TO CHECK BLOOD GLUCOSE UP TO THREE TIMES DAILY 100 each 0   [place] Family History   Problem Relation Age of Onset  . Heart attack Father   . Hypertension Father   . Heart disease Father   . Diabetes Sister        HTN  . Diabetes Brother        HTN, heart problems  . Hypertension Other   . Diabetes Paternal Grandmother   . Heart disease Paternal Grandfather   . Colon cancer Neg Hx   . Rectal cancer Neg Hx   . Stomach cancer Neg Hx    Social History:   reports that he quit smoking about 33 years ago. His smoking use included cigarettes. He has a 45.00 pack-year smoking history. He has never used smokeless tobacco. He reports previous alcohol use. He reports previous drug use. Drug: Marijuana.   REVIEW OF SYSTEMS : Negative except for see problem list  Physical Exam:   There were no vitals taken for this visit. There is no height or weight on file to calculate BMI.  Gen:  WDWN WM NAD  Neurological: Alert and oriented to person, place, and time. Motor and sensory function is grossly intact  Head: Normocephalic and atraumatic.  Eyes: Conjunctivae are normal. Pupils are equal, round, and reactive to light. No scleral icterus.  Neck: Normal range of motion. Neck supple. No tracheal deviation or thyromegaly present.  Cardiovascular:  SR without murmurs or gallops.  No carotid bruits Breast:  Not examined Respiratory: Effort normal.  No respiratory distress. No chest wall tenderness. Breath sounds normal.  No wheezes, rales or rhonchi.  Abdomen:  Nontender;  Prior umbilical hernia GU:  Tender in right inguinal region Musculoskeletal: Normal range of motion. Extremities are nontender. No cyanosis, edema or clubbing noted Lymphadenopathy: No cervical, preauricular, postauricular or axillary adenopathy is present Skin: Skin is warm and dry. No rash noted. No diaphoresis. No erythema. No pallor. Pscyh: Normal mood and affect. Behavior is normal. Judgment and thought content normal.   LABORATORY RESULTS: Results for orders placed or performed during the hospital  encounter of 09/12/19 (from the past 48 hour(s))  SARS CORONAVIRUS 2 (TAT 6-24 HRS) Nasopharyngeal Nasopharyngeal Swab     Status: None   Collection Time: 09/12/19  9:05 AM   Specimen: Nasopharyngeal Swab  Result Value Ref Range   SARS Coronavirus 2 NEGATIVE NEGATIVE    Comment: (NOTE) SARS-CoV-2 target nucleic acids are NOT DETECTED.  The SARS-CoV-2 RNA is generally detectable in upper and lower respiratory specimens during the acute phase of  infection. Negative results do not preclude SARS-CoV-2 infection, do not rule out co-infections with other pathogens, and should not be used as the sole basis for treatment or other patient management decisions. Negative results must be combined with clinical observations, patient history, and epidemiological information. The expected result is Negative.  Fact Sheet for Patients: SugarRoll.be  Fact Sheet for Healthcare Providers: https://www.woods-mathews.com/  This test is not yet approved or cleared by the [place] FDA and  has been authorized for detection and/or diagnosis of SARS-CoV-2 by FDA under an Emergency Use Authorization (EUA). This EUA will remain  in effect (meaning this test can be used) for the duration of the COVID-19 declaration under Se ction 564(b)(1) of the Act, 21 U.S.C. section 360bbb-3(b)(1), unless the authorization is terminated or revoked sooner.  Performed at [facility], [place] [address], [place], [place] [address]      RADIOLOGY RESULTS: No results found.  Problem List: Patient Active Problem List   Diagnosis Date Noted  . Right inguinal hernia 08/14/2019  . History of shingles 02/28/2017  . Post herpetic neuralgia 02/28/2017  . Family history of coronary artery disease in father 10/23/2013  . Tobacco abuse 10/17/2013  . Unstable angina ([place]) 10/04/2013  . PVC's (premature ventricular contractions) 10/04/2013  . Neuropathy, diabetic ([place])  01/31/2012  . Hypertension associated with diabetes ([place])   . Dyslipidemia associated with type 2 diabetes mellitus ([place])   . Hyperlipidemia     Assessment & Plan: Bilateral inguinal herniae-plan TEP or open right     Matt B. Hassell Done, MD, [facility] [phone] beeper [phone]  09/13/2019 4:55 PM

## 2019-09-13 NOTE — Progress Notes (Signed)
Spoke with Dr Hassell Done in regards to surgical orders to be placed in epic for scheduled procedure for Monday 09/16/2019. Dr Hassell Done filled surgical consent in .

## 2019-09-16 ENCOUNTER — Ambulatory Visit (HOSPITAL_COMMUNITY): Payer: 59 | Admitting: Certified Registered"

## 2019-09-16 ENCOUNTER — Ambulatory Visit (HOSPITAL_COMMUNITY)
Admission: RE | Admit: 2019-09-16 | Discharge: 2019-09-16 | Disposition: A | Discharge status: Home / Self Care | Payer: 59 | Attending: Surgery | Admitting: Surgery

## 2019-09-16 ENCOUNTER — Ambulatory Visit (HOSPITAL_COMMUNITY): Payer: 59 | Admitting: Physician Assistant

## 2019-09-16 ENCOUNTER — Encounter (HOSPITAL_COMMUNITY): Payer: Self-pay | Admitting: Surgery

## 2019-09-16 ENCOUNTER — Encounter (HOSPITAL_COMMUNITY)
Admission: RE | Disposition: A | Discharge status: Home / Self Care | Payer: Self-pay | Source: Home / Self Care | Attending: Surgery

## 2019-09-16 DIAGNOSIS — E1122 Type 2 diabetes mellitus with diabetic chronic kidney disease: Secondary | ICD-10-CM | POA: Diagnosis not present

## 2019-09-16 DIAGNOSIS — M19041 Primary osteoarthritis, right hand: Secondary | ICD-10-CM | POA: Diagnosis not present

## 2019-09-16 DIAGNOSIS — Z87891 Personal history of nicotine dependence: Secondary | ICD-10-CM | POA: Diagnosis not present

## 2019-09-16 DIAGNOSIS — I129 Hypertensive chronic kidney disease with stage 1 through stage 4 chronic kidney disease, or unspecified chronic kidney disease: Secondary | ICD-10-CM | POA: Insufficient documentation

## 2019-09-16 DIAGNOSIS — Z7984 Long term (current) use of oral hypoglycemic drugs: Secondary | ICD-10-CM | POA: Insufficient documentation

## 2019-09-16 DIAGNOSIS — N189 Chronic kidney disease, unspecified: Secondary | ICD-10-CM | POA: Diagnosis not present

## 2019-09-16 DIAGNOSIS — E1140 Type 2 diabetes mellitus with diabetic neuropathy, unspecified: Secondary | ICD-10-CM | POA: Insufficient documentation

## 2019-09-16 DIAGNOSIS — Z79899 Other long term (current) drug therapy: Secondary | ICD-10-CM | POA: Insufficient documentation

## 2019-09-16 DIAGNOSIS — F419 Anxiety disorder, unspecified: Secondary | ICD-10-CM | POA: Diagnosis not present

## 2019-09-16 DIAGNOSIS — M19042 Primary osteoarthritis, left hand: Secondary | ICD-10-CM | POA: Insufficient documentation

## 2019-09-16 DIAGNOSIS — I25110 Atherosclerotic heart disease of native coronary artery with unstable angina pectoris: Secondary | ICD-10-CM | POA: Insufficient documentation

## 2019-09-16 DIAGNOSIS — E785 Hyperlipidemia, unspecified: Secondary | ICD-10-CM | POA: Insufficient documentation

## 2019-09-16 DIAGNOSIS — K4020 Bilateral inguinal hernia, without obstruction or gangrene, not specified as recurrent: Secondary | ICD-10-CM | POA: Diagnosis not present

## 2019-09-16 HISTORY — PX: INGUINAL HERNIA REPAIR: SHX194

## 2019-09-16 LAB — GLUCOSE, CAPILLARY: Glucose-Capillary: 122 mg/dL — ABNORMAL HIGH (ref 70–99)

## 2019-09-16 SURGERY — REPAIR, HERNIA, INGUINAL, LAPAROSCOPIC
Anesthesia: General | Site: Inguinal | Laterality: Bilateral

## 2019-09-16 MED ORDER — MIDAZOLAM HCL 2 MG/2ML IJ SOLN
INTRAMUSCULAR | Status: AC
  Filled 2019-09-16: qty 2

## 2019-09-16 MED ORDER — ROCURONIUM BROMIDE 10 MG/ML (PF) SYRINGE
PREFILLED_SYRINGE | INTRAVENOUS | Status: DC | PRN
  Administered 2019-09-16: 50 mg via INTRAVENOUS
  Administered 2019-09-16 (×2): 10 mg via INTRAVENOUS

## 2019-09-16 MED ORDER — CEFAZOLIN SODIUM-DEXTROSE 2-4 GM/100ML-% IV SOLN
INTRAVENOUS | Status: AC
  Filled 2019-09-16: qty 100

## 2019-09-16 MED ORDER — CHLORHEXIDINE GLUCONATE CLOTH 2 % EX PADS: 6.0000 | MEDICATED_PAD | Freq: Once | CUTANEOUS | Status: DC

## 2019-09-16 MED ORDER — ROCURONIUM BROMIDE 10 MG/ML (PF) SYRINGE
PREFILLED_SYRINGE | INTRAVENOUS | Status: AC
  Filled 2019-09-16: qty 10

## 2019-09-16 MED ORDER — CEFAZOLIN SODIUM-DEXTROSE 2-4 GM/100ML-% IV SOLN
2.0000 g | INTRAVENOUS | Status: AC
  Administered 2019-09-16: 2 g via INTRAVENOUS

## 2019-09-16 MED ORDER — ORAL CARE MOUTH RINSE: 15.0000 mL | Freq: Once | OROMUCOSAL | Status: AC

## 2019-09-16 MED ORDER — BUPIVACAINE LIPOSOME 1.3 % IJ SUSP
20.0000 mL | Freq: Once | INTRAMUSCULAR | Status: AC
  Administered 2019-09-16: 20 mL
  Filled 2019-09-16: qty 20

## 2019-09-16 MED ORDER — PROPOFOL 10 MG/ML IV BOLUS
INTRAVENOUS | Status: DC | PRN
  Administered 2019-09-16: 140 mg via INTRAVENOUS

## 2019-09-16 MED ORDER — DEXAMETHASONE SODIUM PHOSPHATE 10 MG/ML IJ SOLN
INTRAMUSCULAR | Status: AC
  Filled 2019-09-16: qty 1

## 2019-09-16 MED ORDER — ONDANSETRON HCL 4 MG/2ML IJ SOLN
INTRAMUSCULAR | Status: AC
  Filled 2019-09-16: qty 2

## 2019-09-16 MED ORDER — SUCCINYLCHOLINE CHLORIDE 200 MG/10ML IV SOSY
PREFILLED_SYRINGE | INTRAVENOUS | Status: AC
  Filled 2019-09-16: qty 10

## 2019-09-16 MED ORDER — MIDAZOLAM HCL 2 MG/2ML IJ SOLN
INTRAMUSCULAR | Status: DC | PRN
  Administered 2019-09-16: 2 mg via INTRAVENOUS

## 2019-09-16 MED ORDER — FENTANYL CITRATE (PF) 100 MCG/2ML IJ SOLN
INTRAMUSCULAR | Status: AC
  Filled 2019-09-16: qty 2

## 2019-09-16 MED ORDER — SUCCINYLCHOLINE CHLORIDE 200 MG/10ML IV SOSY
PREFILLED_SYRINGE | INTRAVENOUS | Status: DC | PRN
  Administered 2019-09-16: 120 mg via INTRAVENOUS

## 2019-09-16 MED ORDER — DEXAMETHASONE SODIUM PHOSPHATE 10 MG/ML IJ SOLN
INTRAMUSCULAR | Status: DC | PRN
  Administered 2019-09-16: 4 mg via INTRAVENOUS

## 2019-09-16 MED ORDER — HYDROMORPHONE HCL 1 MG/ML IJ SOLN
INTRAMUSCULAR | Status: AC
  Filled 2019-09-16: qty 2

## 2019-09-16 MED ORDER — GLYCOPYRROLATE PF 0.2 MG/ML IJ SOSY
PREFILLED_SYRINGE | INTRAMUSCULAR | Status: DC | PRN
Start: 2019-09-16 — End: 2019-09-16
  Administered 2019-09-16: .2 mg via INTRAVENOUS

## 2019-09-16 MED ORDER — LIDOCAINE 2% (20 MG/ML) 5 ML SYRINGE
INTRAMUSCULAR | Status: AC
  Filled 2019-09-16: qty 5

## 2019-09-16 MED ORDER — LACTATED RINGERS IV SOLN: INTRAVENOUS | Status: DC

## 2019-09-16 MED ORDER — HYDROCODONE-ACETAMINOPHEN 5-325 MG PO TABS: 1.0000 | ORAL_TABLET | Freq: Once | ORAL | Status: DC | PRN

## 2019-09-16 MED ORDER — LIDOCAINE 2% (20 MG/ML) 5 ML SYRINGE
INTRAMUSCULAR | Status: DC | PRN
  Administered 2019-09-16: 50 mg via INTRAVENOUS

## 2019-09-16 MED ORDER — ONDANSETRON HCL 4 MG/2ML IJ SOLN
INTRAMUSCULAR | Status: DC | PRN
  Administered 2019-09-16: 4 mg via INTRAVENOUS

## 2019-09-16 MED ORDER — HYDROMORPHONE HCL 1 MG/ML IJ SOLN
0.2500 mg | INTRAMUSCULAR | Status: DC | PRN
  Administered 2019-09-16 (×4): 0.5 mg via INTRAVENOUS

## 2019-09-16 MED ORDER — SUGAMMADEX SODIUM 200 MG/2ML IV SOLN
INTRAVENOUS | Status: DC | PRN
  Administered 2019-09-16: 160 mg via INTRAVENOUS

## 2019-09-16 MED ORDER — FENTANYL CITRATE (PF) 100 MCG/2ML IJ SOLN
INTRAMUSCULAR | Status: DC | PRN
  Administered 2019-09-16 (×6): 50 ug via INTRAVENOUS

## 2019-09-16 MED ORDER — PROPOFOL 10 MG/ML IV BOLUS
INTRAVENOUS | Status: AC
  Filled 2019-09-16: qty 20

## 2019-09-16 MED ORDER — HYDROCODONE-ACETAMINOPHEN 5-325 MG PO TABS
1.0000 | ORAL_TABLET | Freq: Four times a day (QID) | ORAL | 0 refills | Status: DC | PRN
Start: 2019-09-16 — End: 2020-08-27

## 2019-09-16 MED ORDER — BUPIVACAINE-EPINEPHRINE (PF) 0.25% -1:200000 IJ SOLN
INTRAMUSCULAR | Status: AC
  Filled 2019-09-16: qty 30

## 2019-09-16 MED ORDER — 0.9 % SODIUM CHLORIDE (POUR BTL) OPTIME
TOPICAL | Status: DC | PRN
  Administered 2019-09-16: 1000 mL

## 2019-09-16 MED ORDER — LACTATED RINGERS IR SOLN
Status: DC | PRN
  Administered 2019-09-16: 1000 mL

## 2019-09-16 MED ORDER — CHLORHEXIDINE GLUCONATE 0.12 % MT SOLN
15.0000 mL | Freq: Once | OROMUCOSAL | Status: AC
  Administered 2019-09-16: 15 mL via OROMUCOSAL

## 2019-09-16 SURGICAL SUPPLY — 37 items
ADH SKN CLS APL DERMABOND .7 (GAUZE/BANDAGES/DRESSINGS) ×1
APL SKNCLS STERI-STRIP NONHPOA (GAUZE/BANDAGES/DRESSINGS) ×1
BENZOIN TINCTURE PRP APPL 2/3 (GAUZE/BANDAGES/DRESSINGS) ×2 IMPLANT
CABLE HIGH FREQUENCY MONO STRZ (ELECTRODE) ×2 IMPLANT
COVER SURGICAL LIGHT HANDLE (MISCELLANEOUS) ×2 IMPLANT
COVER WAND RF STERILE (DRAPES) IMPLANT
DECANTER SPIKE VIAL GLASS SM (MISCELLANEOUS) ×2 IMPLANT
DERMABOND ADVANCED (GAUZE/BANDAGES/DRESSINGS) ×1
DERMABOND ADVANCED .7 DNX12 (GAUZE/BANDAGES/DRESSINGS) IMPLANT
DEVICE SECURE STRAP 25 ABSORB (INSTRUMENTS) IMPLANT
DISSECT BALLN SPACEMKR + OVL (BALLOONS) ×2
DISSECTOR BALLN SPACEMKR + OVL (BALLOONS) IMPLANT
DISSECTOR BLUNT TIP ENDO 5MM (MISCELLANEOUS) IMPLANT
ELECT L-HOOK LAP 45CM DISP (ELECTROSURGICAL) ×2
ELECT PENCIL ROCKER SW 15FT (MISCELLANEOUS) ×1 IMPLANT
ELECT REM PT RETURN 15FT ADLT (MISCELLANEOUS) ×2 IMPLANT
ELECTRODE L-HOOK LAP 45CM DISP (ELECTROSURGICAL) IMPLANT
GLOVE BIOGEL M 8.0 STRL (GLOVE) ×2 IMPLANT
GOWN STRL REUS W/TWL XL LVL3 (GOWN DISPOSABLE) ×6 IMPLANT
KIT BASIN OR (CUSTOM PROCEDURE TRAY) ×2 IMPLANT
KIT TURNOVER KIT A (KITS) IMPLANT
MESH 3DMAX 4X6 LT LRG (Mesh General) ×1 IMPLANT
MESH 3DMAX 4X6 RT LRG (Mesh General) ×1 IMPLANT
PAD POSITIONING PINK XL (MISCELLANEOUS) IMPLANT
SCISSORS LAP 5X35 DISP (ENDOMECHANICALS) IMPLANT
SET IRRIG TUBING LAPAROSCOPIC (IRRIGATION / IRRIGATOR) IMPLANT
SET TUBE SMOKE EVAC HIGH FLOW (TUBING) ×2 IMPLANT
SLEEVE ADV FIXATION 5X100MM (TROCAR) ×2 IMPLANT
SUT VIC AB 3-0 SH 27 (SUTURE)
SUT VIC AB 3-0 SH 27XBRD (SUTURE) IMPLANT
SUT VIC AB 4-0 SH 18 (SUTURE) ×2 IMPLANT
SYR 10ML ECCENTRIC (SYRINGE) ×2 IMPLANT
SYR 30ML LL (SYRINGE) ×2 IMPLANT
TACKER 5MM HERNIA 3.5CML NAB (ENDOMECHANICALS) IMPLANT
TRAY FOLEY MTR SLVR 16FR STAT (SET/KITS/TRAYS/PACK) IMPLANT
TRAY LAPAROSCOPIC (CUSTOM PROCEDURE TRAY) ×2 IMPLANT
TROCAR ADV FIXATION 5X100MM (TROCAR) ×2 IMPLANT

## 2019-09-16 NOTE — Anesthesia Postprocedure Evaluation (Signed)
Anesthesia Post Note  Patient: Seth Pope  Procedure(s) Performed: LAPAROSCOPIC BILATERAL INGUINAL HERNIA (Bilateral Inguinal)     Patient location during evaluation: PACU Anesthesia Type: General Level of consciousness: awake Pain management: pain level controlled Vital Signs Assessment: post-procedure vital signs reviewed and stable Respiratory status: spontaneous breathing Cardiovascular status: stable Postop Assessment: no apparent nausea or vomiting Anesthetic complications: no   No complications documented.  Last Vitals:  Vitals:   09/16/19 1506 09/16/19 1540  BP: (!) 156/92 (!) 146/82  Pulse: 63 (!) 58  Resp:  18  Temp:    SpO2: 92% 96%    Last Pain:  Vitals:   09/16/19 1540  TempSrc:   PainSc: 3                  Romana Deaton

## 2019-09-16 NOTE — Transfer of Care (Signed)
Immediate Anesthesia Transfer of Care Note  Patient: Seth Pope  Procedure(s) Performed: LAPAROSCOPIC BILATERAL INGUINAL HERNIA (Bilateral Inguinal)  Patient Location: PACU  Anesthesia Type:General  Level of Consciousness: awake, alert  and oriented  Airway & Oxygen Therapy: Patient Spontanous Breathing and Patient connected to face mask oxygen  Post-op Assessment: Patient moving all extremities X 4  Post vital signs: Reviewed and stable  Last Vitals:  Vitals Value Taken Time  BP    Temp    Pulse 71 09/16/19 1402  Resp 11 09/16/19 1402  SpO2 100 % 09/16/19 1402  Vitals shown include unvalidated device data.  Last Pain:  Vitals:   09/16/19 0953  TempSrc: Oral  PainSc:       Patients Stated Pain Goal: 6 (20/91/98 0221)  Complications: No complications documented.

## 2019-09-16 NOTE — Discharge Instructions (Signed)
Laparoscopic Inguinal Hernia Repair, Adult, Care After This sheet gives you information about how to care for yourself after your procedure. Your health care provider may also give you more specific instructions. If you have problems or questions, contact your health care provider. What can I expect after the procedure? After the procedure, it is common to have:  Pain.  Swelling and bruising around the incision area.  Scrotal swelling, in men.  Some fluid or blood draining from your incisions. Follow these instructions at home: Incision care  Follow instructions from your health care provider about how to take care of your incisions. Make sure you: ? Wash your hands with soap and water before you change your bandage (dressing). If soap and water are not available, use hand sanitizer. ? Change your dressing as told by your health care provider. ? Leave stitches (sutures), skin glue, or adhesive strips in place. These skin closures may need to stay in place for 2 weeks or longer. If adhesive strip edges start to loosen and curl up, you may trim the loose edges. Do not remove adhesive strips completely unless your health care provider tells you to do that.  Check your incision area every day for signs of infection. Check for: ? More redness, swelling, or pain. ? More fluid or blood. ? Warmth. ? Pus or a bad smell.  Wear loose, soft clothing while your incisions heal. Driving  Do not drive or use heavy machinery while taking prescription pain medicine.  Do not drive for 24 hours if you were given a medicine to help you relax (sedative) during your procedure. Activity  Do not lift anything that is heavier than 10 lb (4.5 kg), or the limit that you are told, until your health care provider says that it is safe.  Ask your health care provider what activities are safe for you. A lot of activity during the first week after surgery can increase pain and swelling. For 1 week after your  procedure: ? Avoid activities that take a lot of effort, such as exercise or sports. ? You may walk and climb stairs as needed for daily activity, but avoid long walks or climbing stairs for exercise. Managing pain and swelling   Put ice on painful or swollen areas: ? Put ice in a plastic bag. ? Place a towel between your skin and the bag. ? Leave the ice on for 20 minutes, 2-3 times a day. General instructions  Do not take baths, swim, or use a hot tub until your health care provider approves. Ask your health care provider if you may take showers. You may only be allowed to take sponge baths.  Take over-the-counter and prescription medicines only as told by your health care provider.  To prevent or treat constipation while you are taking prescription pain medicine, your health care provider may recommend that you: ? Drink enough fluid to keep your urine pale yellow. ? Take over-the-counter or prescription medicines. ? Eat foods that are high in fiber, such as fresh fruits and vegetables, whole grains, and beans. ? Limit foods that are high in fat and processed sugars, such as fried and sweet foods.  Do not use any products that contain nicotine or tobacco, such as cigarettes and e-cigarettes. If you need help quitting, ask your health care provider.  Drink enough fluid to keep your urine pale yellow.  Keep all follow-up visits as told by your health care provider. This is important. Contact a health care provider if:    You have more redness, swelling, or pain around your incisions or your groin area.  You have more swelling in your scrotum.  You have more fluid or blood coming from your incisions.  Your incisions feel warm to the touch.  You have severe pain and medicines do not help.  You have abdominal pain or swelling.  You cannot eat or drink without vomiting.  You cannot urinate or pass a bowel movement.  You faint.  You feel dizzy.  You have nausea and  vomiting.  You have a fever. Get help right away if:  You have pus or a bad smell coming from your incisions.  You have redness, warmth, or pain in your leg.  You have chest pain.  You have problems breathing. Summary  Pain, swelling, and bruising are common after the procedure.  Check your incision area every day for signs of infection, such as more redness, swelling, or pain.  Put ice on painful or swollen areas for 20 minutes, 2-3 times a day. This information is not intended to replace advice given to you by your health care provider. Make sure you discuss any questions you have with your health care provider. Document Revised: 07/11/2018 Document Reviewed: 05/12/2016 Elsevier Patient Education  [place].

## 2019-09-16 NOTE — Anesthesia Procedure Notes (Signed)
Procedure Name: Intubation Date/Time: 09/16/2019 12:04 PM Performed by: Niel Hummer, CRNA Pre-anesthesia Checklist: Patient identified, Emergency Drugs available, Suction available and Patient being monitored Patient Re-evaluated:Patient Re-evaluated prior to induction Oxygen Delivery Method: Circle system utilized Preoxygenation: Pre-oxygenation with 100% oxygen Induction Type: IV induction Laryngoscope Size: Mac and 4 Grade View: Grade I Tube type: Oral Tube size: 7.5 mm Number of attempts: 1 Airway Equipment and Method: Stylet Placement Confirmation: ETT inserted through vocal cords under direct vision,  positive ETCO2 and breath sounds checked- equal and bilateral Secured at: 22 cm Tube secured with: Tape Dental Injury: Teeth and Oropharynx as per pre-operative assessment

## 2019-09-16 NOTE — Op Note (Signed)
CALE BETHARD  Sep 19, 1950   09/16/2019    PCP:  Horald Pollen, MD   Surgeon: Kaylyn Lim, MD, FACS  Asst:  none  Anes:  general  Preop Dx: Bilateral inguinal hernias right larger than left Postop Dx: Bilateral direct inguinal hernias right larger than left  Procedure: Laparoscopic TEP with placement of large Bard 3D max mesh bilaterally Location Surgery: [place] [facility] OR 1 Complications: None  EBL:   Minimal cc  Drains: None  Description of Procedure:  The patient was taken to OR 1.  After anesthesia was administered and the patient was prepped  with technique care and a timeout was performed.  The Covidien balloon dissector was inserted and deployed with 30 pumps to get a good preperitoneal dissection.  It was placed through a transverse incision in the anterior rectus to the right of midline.  The dissection on the right was actually better than the left but I can see a direct hernia sac at the time of dissection.  The left side was covered with more fat and took a little more manipulation to visualize.  I skeletonized both cord structures and did not see any indirect sacs.  Bleeding was controlled.  There were there was the midline 10 / 30mm trocar site and then 2 fives were placed on either side.  The right 3D max mesh was introduced first and deployed intact with a protack.  It covered the defect well and was about centered in the midst of it.  The left 3D max mesh which was deployed and overlapped.  It was secured with protacker or I could feel it and never in the areas of restriction.  Port sites were injected with Exparel and with closed with Monocryl and Dermabond.  The patient tolerated the procedure well and was taken to the PACU in stable condition.     Matt B. Hassell Done, [place], [facility], [place]

## 2019-09-16 NOTE — Interval H&P Note (Signed)
History and Physical Interval Note:  09/16/2019 11:27 AM  Seth Pope  has presented today for surgery, with the diagnosis of BILATERAL INGUINAL HERNIA.  The various methods of treatment have been discussed with the patient and family. After consideration of risks, benefits and other options for treatment, the patient has consented to  Procedure(s): LAPAROSCOPIC BILATERAL INGUINAL HERNIA (Bilateral) as a surgical intervention.  The patient's history has been reviewed, patient examined, no change in status, stable for surgery.  I have reviewed the patient's chart and labs.  Questions were answered to the patient's satisfaction.     Pedro Earls

## 2019-09-16 NOTE — Anesthesia Preprocedure Evaluation (Signed)
Anesthesia Evaluation  Patient identified by MRN, date of birth, ID band Patient awake    Reviewed: Allergy & Precautions, NPO status , Patient's Chart, lab work & pertinent test results  Airway Mallampati: II  TM Distance: >3 FB     Dental   Pulmonary neg pulmonary ROS, former smoker,    breath sounds clear to auscultation       Cardiovascular hypertension, + angina + CAD   Rhythm:Regular Rate:Normal     Neuro/Psych    GI/Hepatic Neg liver ROS, History noted. CG   Endo/Other  diabetes  Renal/GU Renal disease     Musculoskeletal   Abdominal   Peds  Hematology   Anesthesia Other Findings   Reproductive/Obstetrics                             Anesthesia Physical Anesthesia Plan  ASA: III  Anesthesia Plan: General   Post-op Pain Management:    Induction: Intravenous  PONV Risk Score and Plan: 3 and Ondansetron, Dexamethasone and Midazolam  Airway Management Planned: Oral ETT  Additional Equipment:   Intra-op Plan:   Post-operative Plan: Extubation in OR  Informed Consent: I have reviewed the patients History and Physical, chart, labs and discussed the procedure including the risks, benefits and alternatives for the proposed anesthesia with the patient or authorized representative who has indicated his/her understanding and acceptance.     Dental advisory given  Plan Discussed with: CRNA and Anesthesiologist  Anesthesia Plan Comments:         Anesthesia Quick Evaluation

## 2019-09-17 ENCOUNTER — Encounter (HOSPITAL_COMMUNITY): Payer: Self-pay | Admitting: Surgery

## 2019-10-22 ENCOUNTER — Other Ambulatory Visit: Payer: Self-pay | Admitting: Emergency Medicine

## 2019-10-22 DIAGNOSIS — E119 Type 2 diabetes mellitus without complications: Secondary | ICD-10-CM

## 2019-10-23 ENCOUNTER — Other Ambulatory Visit: Payer: Self-pay | Admitting: Emergency Medicine

## 2019-10-23 NOTE — Telephone Encounter (Signed)
Requested medication (s) are due for refill today: no  Requested medication (s) are on the active medication list: yes   Last refill:  10/11/2019  Future visit scheduled: yes  Notes to clinic:  medication filled by historical provider  Review for refill   Requested Prescriptions  Pending Prescriptions Disp Refills   lisinopril (ZESTRIL) 10 MG tablet [Pharmacy Med Name: LISINOPRIL 10MG  TABLETS] 90 tablet     Sig: TAKE 1 TABLET(10 MG) BY MOUTH DAILY      Cardiovascular:  ACE Inhibitors Failed - 10/23/2019 11:22 AM      Failed - Last BP in normal range    BP Readings from Last 1 Encounters:  09/16/19 (!) 146/82          Passed - Cr in normal range and within 180 days    Creat  Date Value Ref Range Status  08/13/2015 1.09 0.70 - 1.25 mg/dL Final    Comment:      For patients > or = 66 years of age: The upper reference limit for Creatinine is approximately 13% higher for people identified as African-American.      Creatinine, Ser  Date Value Ref Range Status  09/11/2019 0.70 0.61 - 1.24 mg/dL Final          Passed - K in normal range and within 180 days    Potassium  Date Value Ref Range Status  09/11/2019 4.2 3.5 - 5.1 mmol/L Final          Passed - Patient is not pregnant      Passed - Valid encounter within last 6 months    Recent Outpatient Visits           2 months ago Hypertension associated with diabetes [facility])   Primary Care at [facility], [place], [place]   4 months ago Hypertension associated with diabetes [facility])   Primary Care at [facility], [place], MD   11 months ago Hypertension associated with diabetes [facility])   Primary Care at [facility], [place], MD   1 year ago Type 2 diabetes mellitus with hyperglycemia, without long-term current use of insulin [facility])   Primary Care at [facility], Ines Bloomer, MD   1 year ago Essential hypertension   Primary Care at [facility], Arlie Solomons, MD       Future Appointments              In 3 months Sagardia, Ines Bloomer, MD Primary Care at [place], [facility]

## 2019-12-10 ENCOUNTER — Other Ambulatory Visit: Payer: Self-pay | Admitting: Emergency Medicine

## 2019-12-10 DIAGNOSIS — I10 Essential (primary) hypertension: Secondary | ICD-10-CM

## 2019-12-10 NOTE — Telephone Encounter (Signed)
Requested Prescriptions  Pending Prescriptions Disp Refills  . hydrochlorothiazide (MICROZIDE) 12.5 MG capsule [Pharmacy Med Name: HYDROCHLOROTHIAZIDE 12.5MG  CAPSULES] 90 capsule 1    Sig: TAKE 1 CAPSULE(12.5 MG) BY MOUTH DAILY     Cardiovascular: Diuretics - Thiazide Failed - 12/10/2019  7:07 AM      Failed - Last BP in normal range    BP Readings from Last 1 Encounters:  09/16/19 (!) 146/82         Passed - Ca in normal range and within 360 days    Calcium  Date Value Ref Range Status  09/11/2019 9.2 8.9 - 10.3 mg/dL Final   Calcium, Ion  Date Value Ref Range Status  10/23/2013 1.19 1.13 - 1.30 mmol/L Final         Passed - Cr in normal range and within 360 days    Creat  Date Value Ref Range Status  08/13/2015 1.09 0.70 - 1.25 mg/dL Final    Comment:      For patients > or = 67 years of age: The upper reference limit for Creatinine is approximately 13% higher for people identified as African-American.      Creatinine, Ser  Date Value Ref Range Status  09/11/2019 0.70 0.61 - 1.24 mg/dL Final         Passed - K in normal range and within 360 days    Potassium  Date Value Ref Range Status  09/11/2019 4.2 3.5 - 5.1 mmol/L Final         Passed - Na in normal range and within 360 days    Sodium  Date Value Ref Range Status  09/11/2019 138 135 - 145 mmol/L Final  08/14/2019 139 134 - 144 mmol/L Final         Passed - Valid encounter within last 6 months    Recent Outpatient Visits          3 months ago Hypertension associated with diabetes [facility])   Primary Care at [place], [place], MD   6 months ago Hypertension associated with diabetes [facility])   Primary Care at [facility], Ines Bloomer, MD   1 year ago Hypertension associated with diabetes [facility])   Primary Care at [facility], [place], MD   1 year ago Type 2 diabetes mellitus with hyperglycemia, without long-term current use of insulin [facility])   Primary Care at [facility], Ines Bloomer,  MD   1 year ago Essential hypertension   Primary Care at [facility], [place], MD      Future Appointments            In 2 months Sagardia, Ines Bloomer, MD Primary Care at [place], [facility]           . metoprolol tartrate (LOPRESSOR) 100 MG tablet [Pharmacy Med Name: METOPROLOL TARTRATE 100MG  TABLETS] 180 tablet 1    Sig: TAKE 1 TABLET(100 MG) BY MOUTH TWICE DAILY     Cardiovascular:  Beta Blockers Failed - 12/10/2019  7:07 AM      Failed - Last BP in normal range    BP Readings from Last 1 Encounters:  09/16/19 (!) 146/82         Passed - Last Heart Rate in normal range    Pulse Readings from Last 1 Encounters:  09/16/19 (!) 58         Passed - Valid encounter within last 6 months    Recent Outpatient Visits  3 months ago Hypertension associated with diabetes [facility])   Primary Care at [facility], [place], [place]   6 months ago Hypertension associated with diabetes [facility])   Primary Care at [facility], Ines Bloomer, MD   1 year ago Hypertension associated with diabetes [facility])   Primary Care at [facility], [place], MD   1 year ago Type 2 diabetes mellitus with hyperglycemia, without long-term current use of insulin [facility])   Primary Care at [facility], Ines Bloomer, MD   1 year ago Essential hypertension   Primary Care at [facility], Arlie Solomons, MD      Future Appointments            In 2 months Sagardia, Ines Bloomer, MD Primary Care at [place], [facility]

## 2019-12-23 ENCOUNTER — Other Ambulatory Visit: Payer: Self-pay | Admitting: Emergency Medicine

## 2019-12-23 DIAGNOSIS — I152 Hypertension secondary to endocrine disorders: Secondary | ICD-10-CM

## 2019-12-23 DIAGNOSIS — E1165 Type 2 diabetes mellitus with hyperglycemia: Secondary | ICD-10-CM

## 2019-12-23 DIAGNOSIS — E1159 Type 2 diabetes mellitus with other circulatory complications: Secondary | ICD-10-CM

## 2019-12-25 ENCOUNTER — Other Ambulatory Visit: Payer: Self-pay | Admitting: Emergency Medicine

## 2019-12-25 DIAGNOSIS — I152 Hypertension secondary to endocrine disorders: Secondary | ICD-10-CM

## 2019-12-25 DIAGNOSIS — E1159 Type 2 diabetes mellitus with other circulatory complications: Secondary | ICD-10-CM

## 2020-01-22 ENCOUNTER — Other Ambulatory Visit: Payer: Self-pay | Admitting: Emergency Medicine

## 2020-01-22 DIAGNOSIS — E119 Type 2 diabetes mellitus without complications: Secondary | ICD-10-CM

## 2020-02-13 ENCOUNTER — Ambulatory Visit: Payer: Medicare Other | Admitting: Emergency Medicine

## 2020-02-13 ENCOUNTER — Telehealth: Payer: Self-pay | Admitting: Emergency Medicine

## 2020-02-13 NOTE — Telephone Encounter (Signed)
Patient may need courtesy refill on Meds as pt came in today for med refill appt / pt has reschduled

## 2020-02-27 ENCOUNTER — Ambulatory Visit (INDEPENDENT_AMBULATORY_CARE_PROVIDER_SITE_OTHER): Payer: 59 | Admitting: Emergency Medicine

## 2020-02-27 ENCOUNTER — Other Ambulatory Visit: Payer: Self-pay

## 2020-02-27 ENCOUNTER — Encounter: Payer: Self-pay | Admitting: Emergency Medicine

## 2020-02-27 VITALS — BP 111/69 | HR 57 | Temp 97.9°F | Ht 70.0 in | Wt 163.0 lb

## 2020-02-27 DIAGNOSIS — Z23 Encounter for immunization: Secondary | ICD-10-CM

## 2020-02-27 DIAGNOSIS — E1159 Type 2 diabetes mellitus with other circulatory complications: Secondary | ICD-10-CM

## 2020-02-27 DIAGNOSIS — E1165 Type 2 diabetes mellitus with hyperglycemia: Secondary | ICD-10-CM | POA: Diagnosis not present

## 2020-02-27 DIAGNOSIS — E785 Hyperlipidemia, unspecified: Secondary | ICD-10-CM

## 2020-02-27 DIAGNOSIS — I152 Hypertension secondary to endocrine disorders: Secondary | ICD-10-CM | POA: Diagnosis not present

## 2020-02-27 DIAGNOSIS — E1169 Type 2 diabetes mellitus with other specified complication: Secondary | ICD-10-CM | POA: Diagnosis not present

## 2020-02-27 LAB — POCT GLYCOSYLATED HEMOGLOBIN (HGB A1C): Hemoglobin A1C: 6.6 % — AB (ref 4.0–5.6)

## 2020-02-27 LAB — GLUCOSE, POCT (MANUAL RESULT ENTRY): POC Glucose: 123 mg/dl — AB (ref 70–99)

## 2020-02-27 NOTE — Assessment & Plan Note (Signed)
Well-controlled hypertension.  Continue present medications.  No changes. Well-controlled diabetes with hemoglobin A1c is 6.6.  Continue present medications.  No changes. Diet and nutrition discussed. Continue atorvastatin. Follow-up in 6 months.

## 2020-02-27 NOTE — Progress Notes (Signed)
Daviyon Michelene Heady 70 y.o.   Chief Complaint  Patient presents with  . Hypertension  . Diabetes    Follow up no concerns reported    HISTORY OF PRESENT ILLNESS: This is a 70 y.o. male with history of hypertension and diabetes here for follow-up. Doing well.  Compliant with medications.  Has no complaints or medical concerns today. Presently taking metformin, Tradjenta, and Jardiance. Also on lisinopril, hydrochlorothiazide, and atorvastatin. Also taking Lopressor twice a day. Fully vaccinated against COVID with a booster. Lab Results  Component Value Date   HGBA1C 6.6 (A) 08/14/2019    HPI   Prior to Admission medications   Medication Sig Start Date End Date Taking? Authorizing Provider  ALPRAZolam (XANAX XR) 1 MG 24 hr tablet Take 1 mg by mouth daily.   Yes [provider]  ALPRAZolam (XANAX) 0.25 MG tablet Take 0.25 mg by mouth 2 (two) times daily as needed for anxiety.  03/16/16  Yes [provider]  atorvastatin (LIPITOR) 80 MG tablet Take 1 tablet (80 mg total) by mouth daily. 06/04/19  Yes Shalece Staffa, Ines Bloomer, MD  desipramine (NORPRAMIN) 50 MG tablet Take 150 mg by mouth at bedtime.    Yes [provider]  empagliflozin (JARDIANCE) 10 MG TABS tablet Take by mouth daily.   Yes [provider]  gabapentin (NEURONTIN) 300 MG capsule TAKE 2 CAPSULES(600 MG) BY MOUTH TWICE DAILY 01/22/20  Yes Delrae Hagey, Abney [place], MD  glucose blood (ONETOUCH VERIO) test strip USE TO CHECK BLOOD SUGAR UP TO THREE TIMES DAILY. 07/23/19  Yes Kimi Kroft, Ines Bloomer, MD  hydrochlorothiazide (MICROZIDE) 12.5 MG capsule TAKE 1 CAPSULE(12.5 MG) BY MOUTH DAILY 12/10/19  Yes Weltha Cathy, Ines Bloomer, MD  HYDROcodone-acetaminophen (NORCO/VICODIN) 5-325 MG tablet Take 1 tablet by mouth every 6 (six) hours as needed for moderate pain. 09/16/19  Yes Johnathan Hausen, MD  lidocaine (LIDODERM) 5 % Place 1 patch onto the skin daily. Remove & Discard patch within 12 hours or as directed by  MD Patient taking differently: Place 0.5 patches onto the skin daily as needed (pain). Remove & Discard patch within 12 hours or as directed by MD 06/28/19  Yes Nandigam, Venia Minks, MD  lisinopril (ZESTRIL) 10 MG tablet TAKE 1 TABLET(10 MG) BY MOUTH DAILY 10/23/19  Yes Roxene Alviar, Ines Bloomer, MD  metFORMIN (GLUCOPHAGE-XR) 500 MG 24 hr tablet TAKE 2 TABLETS(1000 MG) BY MOUTH IN THE MORNING AND AT BEDTIME 12/23/19  Yes Cage Gupton, Ines Bloomer, MD  metoprolol tartrate (LOPRESSOR) 100 MG tablet TAKE 1 TABLET(100 MG) BY MOUTH TWICE DAILY 12/10/19  Yes [place], Ines Bloomer, MD  ONETOUCH DELICA LANCETS FINE MISC USE AS DIRECTED TO CHECK BLOOD GLUCOSE UP TO THREE TIMES DAILY 12/14/15  Yes Wardell Honour, MD  TRADJENTA 5 MG TABS tablet TAKE 1 TABLET(5 MG) BY MOUTH DAILY 12/23/19  Yes Horald Pollen, MD    Allergies  Allergen Reactions  . Penicillins Rash    Has patient had a PCN reaction causing immediate rash, facial/tongue/throat swelling, SOB or lightheadedness with hypotension: Yes Has patient had a PCN reaction causing severe rash involving mucus membranes or skin necrosis: No Has patient had a PCN reaction that required hospitalization No Has patient had a PCN reaction occurring within the last 10 years: No If all of the above answers are "NO", then may proceed with Cephalosporins. Occurred in childhood.     Patient Active Problem List   Diagnosis Date Noted  . Right inguinal hernia 08/14/2019  . History of shingles 02/28/2017  .  Post herpetic neuralgia 02/28/2017  . Family history of coronary artery disease in father 10/23/2013  . Tobacco abuse 10/17/2013  . Unstable angina ([place]) 10/04/2013  . PVC's (premature ventricular contractions) 10/04/2013  . Neuropathy, diabetic ([place]) 01/31/2012  . Hypertension associated with diabetes ([place])   . Dyslipidemia associated with type 2 diabetes mellitus ([place])   . Hyperlipidemia     Past Medical History:  Diagnosis Date  . Anxiety   .  Arthritis    hands  . CAD (coronary artery disease)    a. cath 10/23/13: nonobstructive CAD, nl LV function, rec risk factor modification  . Chronic kidney disease    h/o kidney stone - passed stone  . Diabetes mellitus    type 2  . History of pneumonia   . Hyperlipidemia   . Hypertension   . Liver mass   . Sinus bradycardia    a. as low as 36 bpm; b. multiple pauses 2.1-2.3 seconds  . Ulcer   . Umbilical hernia     Past Surgical History:  Procedure Laterality Date  . BACK SURGERY    . CERVICAL SPINE SURGERY    . COLONOSCOPY  2015   Dr Collene Mares 3 TAs, tics, hems  . INGUINAL HERNIA REPAIR Bilateral 09/16/2019   Procedure: LAPAROSCOPIC BILATERAL INGUINAL HERNIA;  Surgeon: Johnathan Hausen, MD;  Location: [facility];  Service: General;  Laterality: Bilateral;  . KNEE SURGERY Right 1970  . LEFT HEART CATHETERIZATION WITH CORONARY ANGIOGRAM N/A 10/23/2013   Procedure: LEFT HEART CATHETERIZATION WITH CORONARY ANGIOGRAM;  Surgeon: Peter M Martinique, MD;  Location: [facility] CATH LAB;  Service: Cardiovascular;  Laterality: N/A;  . MICROLARYNGOSCOPY N/A 09/23/2015   Procedure: MICROLARYNGOSCOPY REMOVAL OF LESION;  Surgeon: Izora Gala, MD;  Location: [facility];  Service: ENT;  Laterality: N/A;  . NM [place]  08/2010   bruce myoview - normal perfusion in all regions, EF 66%, EKG negative for ischemia, no wall motion abnormalities, normal/low risk study                                       . ROTATOR CUFF REPAIR Bilateral 1991   x2  . UMBILICAL HERNIA REPAIR  1988    Social History   Socioeconomic History  . Marital status: Married    Spouse name: Abigail [facility]  . Number of children: 2  . Years of education: 10th   . Highest education level: Not on file  Occupational History  . Occupation: self-employed (Investment banker, operational)  Tobacco Use  . Smoking status: Former Smoker    Packs/day: 2.50    Years: 18.00    Pack years: 45.00    Types: Cigarettes    Quit date: 07/16/1986    Years since quitting: 33.6   . Smokeless tobacco: Never Used  Vaping Use  . Vaping Use: Never used  Substance and Sexual Activity  . Alcohol use: Not Currently  . Drug use: Not Currently    Types: Marijuana    Comment: Last use May 2021  . Sexual activity: Yes  Other Topics Concern  . Not on file  Social History Narrative   Lives at home w/ his wife   Right-handed   Daily caffeine   Social Determinants of Health   Financial Resource Strain: Not on file  Food Insecurity: Not on file  Transportation Needs: Not on file  Physical Activity: Not on file  Stress: Not  on file  Social Connections: Not on file  Intimate Partner Violence: Not on file    Family History  Problem Relation Age of Onset  . Heart attack Father   . Hypertension Father   . Heart disease Father   . Diabetes Sister        HTN  . Diabetes Brother        HTN, heart problems  . Hypertension Other   . Diabetes Paternal Grandmother   . Heart disease Paternal Grandfather   . Colon cancer Neg Hx   . Rectal cancer Neg Hx   . Stomach cancer Neg Hx      Review of Systems  Constitutional: Negative.  Negative for chills and fever.  HENT: Negative.  Negative for congestion and sore throat.   Respiratory: Negative.  Negative for shortness of breath.   Cardiovascular: Negative.  Negative for chest pain and palpitations.  Gastrointestinal: Negative.  Negative for abdominal pain, diarrhea, nausea and vomiting.  Genitourinary: Negative.  Negative for dysuria and hematuria.  Musculoskeletal: Negative.  Negative for back pain, myalgias and neck pain.  Skin: Negative.  Negative for rash.  Neurological: Negative.  Negative for dizziness and headaches.  All other systems reviewed and are negative.   Today's Vitals   02/27/20 1044  BP: 111/69  Pulse: (!) 57  Temp: 97.9 F (36.6 C)  SpO2: 98%  Weight: 163 lb (73.9 kg)  Height: 5\' 10"  (1.778 m)   Body mass index is 23.39 kg/m. BP Readings from Last 3 Encounters:  02/27/20 111/69   09/16/19 (!) 146/82  09/11/19 (!) 132/80   Wt Readings from Last 3 Encounters:  02/27/20 163 lb (73.9 kg)  09/16/19 170 lb 3 oz (77.2 kg)  09/11/19 170 lb 3 oz (77.2 kg)    Physical Exam Vitals reviewed.  Constitutional:      Appearance: Normal appearance.  HENT:     Head: Normocephalic.  Eyes:     Extraocular Movements: Extraocular movements intact.     Conjunctiva/sclera: Conjunctivae normal.     Pupils: Pupils are equal, round, and reactive to light.  Cardiovascular:     Rate and Rhythm: Normal rate and regular rhythm.     Pulses: Normal pulses.     Heart sounds: Normal heart sounds.  Pulmonary:     Effort: Pulmonary effort is normal.     Breath sounds: Normal breath sounds.  Abdominal:     General: Bowel sounds are normal. There is no distension.     Palpations: Abdomen is soft.     Tenderness: There is no abdominal tenderness.  Musculoskeletal:        General: Normal range of motion.     Cervical back: Normal range of motion and neck supple.  Skin:    General: Skin is warm and dry.  Neurological:     General: No focal deficit present.     Mental Status: He is alert and oriented to person, place, and time.  Psychiatric:        Mood and Affect: Mood normal.        Behavior: Behavior normal.    Results for orders placed or performed in visit on 02/27/20 (from the past 24 hour(s))  POCT glucose (manual entry)     Status: Abnormal   Collection Time: 02/27/20 11:32 AM  Result Value Ref Range   POC Glucose 123 (A) 70 - 99 mg/dl  POCT glycosylated hemoglobin (Hb A1C)     Status: Abnormal   Collection Time: 02/27/20  11:37 AM  Result Value Ref Range   Hemoglobin A1C 6.6 (A) 4.0 - 5.6 %   HbA1c POC (<> result, manual entry)     HbA1c, POC (prediabetic range)     HbA1c, POC (controlled diabetic range)       ASSESSMENT & PLAN: Hypertension associated with diabetes ([place]) Well-controlled hypertension.  Continue present medications.  No changes. Well-controlled  diabetes with hemoglobin A1c is 6.6.  Continue present medications.  No changes. Diet and nutrition discussed. Continue atorvastatin. Follow-up in 6 months.  Khiry was seen today for hypertension and diabetes.  Diagnoses and all orders for this visit:  Hypertension associated with diabetes ([place]) -     Comprehensive metabolic panel -     POCT glucose (manual entry) -     POCT glycosylated hemoglobin (Hb A1C)  Type 2 diabetes mellitus with hyperglycemia, without long-term current use of insulin ([facility]) -     HM Diabetes Foot Exam  Need for prophylactic vaccination and inoculation against influenza -     Flu Vaccine QUAD High Dose(Fluad)  Need for shingles vaccine -     Cancel: Varicella-zoster vaccine IM  Dyslipidemia associated with type 2 diabetes mellitus ([place])    Patient Instructions       If you have lab work done today you will be contacted with your lab results within the next 2 weeks.  If you have not heard from [place] then please contact us. The fastest way to get your results is to register for My Chart.   IF you received an x-Dannie today, you will receive an invoice from [facility]. Please contact [facility] at [phone] with questions or concerns regarding your invoice.   IF you received labwork today, you will receive an invoice from [facility]. Please contact [facility] at [phone] with questions or concerns regarding your invoice.   Our billing staff will not be able to assist you with questions regarding bills from these companies.  You will be contacted with the lab results as soon as they are available. The fastest way to get your results is to activate your My Chart account. Instructions are located on the last page of this paperwork. If you have not heard from [place] regarding the results in 2 weeks, please contact this office.      Health Maintenance After Age 33 After age 73, you are at a higher risk for certain long-term diseases and  infections as well as injuries from falls. Falls are a major cause of broken bones and head injuries in people who are older than age 42. Getting regular preventive care can help to keep you healthy and well. Preventive care includes getting regular testing and making lifestyle changes as recommended by your health care provider. Talk with your health care provider about:  Which screenings and tests you should have. A screening is a test that checks for a disease when you have no symptoms.  A diet and exercise plan that is right for you. What should I know about screenings and tests to prevent falls? Screening and testing are the best ways to find a health problem early. Early diagnosis and treatment give you the best chance of managing medical conditions that are common after age 68. Certain conditions and lifestyle choices may make you more likely to have a fall. Your health care provider may recommend:  Regular vision checks. Poor vision and conditions such as cataracts can make you more likely to have a fall. If you wear glasses, make  sure to get your prescription updated if your vision changes.  Medicine review. Work with your health care provider to regularly review all of the medicines you are taking, including over-the-counter medicines. Ask your health care provider about any side effects that may make you more likely to have a fall. Tell your health care provider if any medicines that you take make you feel dizzy or sleepy.  Osteoporosis screening. Osteoporosis is a condition that causes the bones to get weaker. This can make the bones weak and cause them to break more easily.  Blood pressure screening. Blood pressure changes and medicines to control blood pressure can make you feel dizzy.  Strength and balance checks. Your health care provider may recommend certain tests to check your strength and balance while standing, walking, or changing positions.  Foot health exam. Foot pain and  numbness, as well as not wearing proper footwear, can make you more likely to have a fall.  Depression screening. You may be more likely to have a fall if you have a fear of falling, feel emotionally low, or feel unable to do activities that you used to do.  Alcohol use screening. Using too much alcohol can affect your balance and may make you more likely to have a fall. What actions can I take to lower my risk of falls? General instructions  Talk with your health care provider about your risks for falling. Tell your health care provider if: ? You fall. Be sure to tell your health care provider about all falls, even ones that seem minor. ? You feel dizzy, sleepy, or off-balance.  Take over-the-counter and prescription medicines only as told by your health care provider. These include any supplements.  Eat a healthy diet and maintain a healthy weight. A healthy diet includes low-fat dairy products, low-fat (lean) meats, and fiber from whole grains, beans, and lots of fruits and vegetables. Home safety  Remove any tripping hazards, such as rugs, cords, and clutter.  Install safety equipment such as grab bars in bathrooms and safety rails on stairs.  Keep rooms and walkways well-lit. Activity  Follow a regular exercise program to stay fit. This will help you maintain your balance. Ask your health care provider what types of exercise are appropriate for you.  If you need a cane or walker, use it as recommended by your health care provider.  Wear supportive shoes that have nonskid soles.   Lifestyle  Do not drink alcohol if your health care provider tells you not to drink.  If you drink alcohol, limit how much you have: ? 0-1 drink a day for women. ? 0-2 drinks a day for men.  Be aware of how much alcohol is in your drink. In the [place] one drink equals one typical bottle of beer (12 oz), one-half glass of wine (5 oz), or one shot of hard liquor (1 oz).  Do not use any products that  contain nicotine or tobacco, such as cigarettes and e-cigarettes. If you need help quitting, ask your health care provider. Summary  Having a healthy lifestyle and getting preventive care can help to protect your health and wellness after age 2.  Screening and testing are the best way to find a health problem early and help you avoid having a fall. Early diagnosis and treatment give you the best chance for managing medical conditions that are more common for people who are older than age 38.  Falls are a major cause of broken bones and head injuries  in people who are older than age 73. Take precautions to prevent a fall at home.  Work with your health care provider to learn what changes you can make to improve your health and wellness and to prevent falls. This information is not intended to replace advice given to you by your health care provider. Make sure you discuss any questions you have with your health care provider. Document Revised: 05/24/2018 Document Reviewed: 12/14/2016 Elsevier Patient Education  2021 Elsevier Inc.      Agustina Caroli, MD [facility]

## 2020-02-27 NOTE — Patient Instructions (Addendum)
   If you have lab work done today you will be contacted with your lab results within the next 2 weeks.  If you have not heard from us then please contact us. The fastest way to get your results is to register for My Chart.   IF you received an x-Kyrell today, you will receive an invoice from [facility]. Please contact [facility] at [phone] with questions or concerns regarding your invoice.   IF you received labwork today, you will receive an invoice from [facility]. Please contact [facility] at [phone] with questions or concerns regarding your invoice.   Our billing staff will not be able to assist you with questions regarding bills from these companies.  You will be contacted with the lab results as soon as they are available. The fastest way to get your results is to activate your My Chart account. Instructions are located on the last page of this paperwork. If you have not heard from us regarding the results in 2 weeks, please contact this office.       Health Maintenance After Age 65 After age 65, you are at a higher risk for certain long-term diseases and infections as well as injuries from falls. Falls are a major cause of broken bones and head injuries in people who are older than age 65. Getting regular preventive care can help to keep you healthy and well. Preventive care includes getting regular testing and making lifestyle changes as recommended by your health care provider. Talk with your health care provider about:  Which screenings and tests you should have. A screening is a test that checks for a disease when you have no symptoms.  A diet and exercise plan that is right for you. What should I know about screenings and tests to prevent falls? Screening and testing are the best ways to find a health problem early. Early diagnosis and treatment give you the best chance of managing medical conditions that are common after age 65. Certain conditions and  lifestyle choices may make you more likely to have a fall. Your health care provider may recommend:  Regular vision checks. Poor vision and conditions such as cataracts can make you more likely to have a fall. If you wear glasses, make sure to get your prescription updated if your vision changes.  Medicine review. Work with your health care provider to regularly review all of the medicines you are taking, including over-the-counter medicines. Ask your health care provider about any side effects that may make you more likely to have a fall. Tell your health care provider if any medicines that you take make you feel dizzy or sleepy.  Osteoporosis screening. Osteoporosis is a condition that causes the bones to get weaker. This can make the bones weak and cause them to break more easily.  Blood pressure screening. Blood pressure changes and medicines to control blood pressure can make you feel dizzy.  Strength and balance checks. Your health care provider may recommend certain tests to check your strength and balance while standing, walking, or changing positions.  Foot health exam. Foot pain and numbness, as well as not wearing proper footwear, can make you more likely to have a fall.  Depression screening. You may be more likely to have a fall if you have a fear of falling, feel emotionally low, or feel unable to do activities that you used to do.  Alcohol use screening. Using too much alcohol can affect your balance and may make you more   likely to have a fall. What actions can I take to lower my risk of falls? General instructions  Talk with your health care provider about your risks for falling. Tell your health care provider if: ? You fall. Be sure to tell your health care provider about all falls, even ones that seem minor. ? You feel dizzy, sleepy, or off-balance.  Take over-the-counter and prescription medicines only as told by your health care provider. These include any  supplements.  Eat a healthy diet and maintain a healthy weight. A healthy diet includes low-fat dairy products, low-fat (lean) meats, and fiber from whole grains, beans, and lots of fruits and vegetables. Home safety  Remove any tripping hazards, such as rugs, cords, and clutter.  Install safety equipment such as grab bars in bathrooms and safety rails on stairs.  Keep rooms and walkways well-lit. Activity  Follow a regular exercise program to stay fit. This will help you maintain your balance. Ask your health care provider what types of exercise are appropriate for you.  If you need a cane or walker, use it as recommended by your health care provider.  Wear supportive shoes that have nonskid soles.   Lifestyle  Do not drink alcohol if your health care provider tells you not to drink.  If you drink alcohol, limit how much you have: ? 0-1 drink a day for women. ? 0-2 drinks a day for men.  Be aware of how much alcohol is in your drink. In the [place] one drink equals one typical bottle of beer (12 oz), one-half glass of wine (5 oz), or one shot of hard liquor (1 oz).  Do not use any products that contain nicotine or tobacco, such as cigarettes and e-cigarettes. If you need help quitting, ask your health care provider. Summary  Having a healthy lifestyle and getting preventive care can help to protect your health and wellness after age 65.  Screening and testing are the best way to find a health problem early and help you avoid having a fall. Early diagnosis and treatment give you the best chance for managing medical conditions that are more common for people who are older than age 65.  Falls are a major cause of broken bones and head injuries in people who are older than age 65. Take precautions to prevent a fall at home.  Work with your health care provider to learn what changes you can make to improve your health and wellness and to prevent falls. This information is not intended  to replace advice given to you by your health care provider. Make sure you discuss any questions you have with your health care provider. Document Revised: 05/24/2018 Document Reviewed: 12/14/2016 Elsevier Patient Education  2021 Elsevier Inc.  

## 2020-02-28 LAB — COMPREHENSIVE METABOLIC PANEL
ALT: 19 IU/L (ref 0–44)
AST: 18 IU/L (ref 0–40)
Albumin/Globulin Ratio: 2.2 (ref 1.2–2.2)
Albumin: 4.4 g/dL (ref 3.8–4.8)
Alkaline Phosphatase: 84 IU/L (ref 44–121)
BUN/Creatinine Ratio: 15 (ref 10–24)
BUN: 12 mg/dL (ref 8–27)
Bilirubin Total: 0.4 mg/dL (ref 0.0–1.2)
CO2: 21 mmol/L (ref 20–29)
Calcium: 9.6 mg/dL (ref 8.6–10.2)
Chloride: 104 mmol/L (ref 96–106)
Creatinine, Ser: 0.79 mg/dL (ref 0.76–1.27)
GFR calc Af Amer: 107 mL/min/{1.73_m2} (ref >59)
GFR calc non Af Amer: 93 mL/min/{1.73_m2} (ref >59)
Globulin, Total: 2 g/dL (ref 1.5–4.5)
Glucose: 117 mg/dL — ABNORMAL HIGH (ref 65–99)
Potassium: 4.6 mmol/L (ref 3.5–5.2)
Sodium: 139 mmol/L (ref 134–144)
Total Protein: 6.4 g/dL (ref 6.0–8.5)

## 2020-05-01 ENCOUNTER — Other Ambulatory Visit: Payer: Self-pay | Admitting: Emergency Medicine

## 2020-05-01 DIAGNOSIS — E7800 Pure hypercholesterolemia, unspecified: Secondary | ICD-10-CM

## 2020-05-11 ENCOUNTER — Other Ambulatory Visit: Payer: Self-pay | Admitting: Emergency Medicine

## 2020-05-11 DIAGNOSIS — E7800 Pure hypercholesterolemia, unspecified: Secondary | ICD-10-CM

## 2020-06-10 ENCOUNTER — Other Ambulatory Visit: Payer: Self-pay | Admitting: Emergency Medicine

## 2020-06-10 DIAGNOSIS — I152 Hypertension secondary to endocrine disorders: Secondary | ICD-10-CM

## 2020-06-10 DIAGNOSIS — E1165 Type 2 diabetes mellitus with hyperglycemia: Secondary | ICD-10-CM

## 2020-06-10 DIAGNOSIS — I10 Essential (primary) hypertension: Secondary | ICD-10-CM

## 2020-06-10 DIAGNOSIS — E1159 Type 2 diabetes mellitus with other circulatory complications: Secondary | ICD-10-CM

## 2020-07-06 ENCOUNTER — Other Ambulatory Visit: Payer: Self-pay | Admitting: Emergency Medicine

## 2020-07-06 DIAGNOSIS — I10 Essential (primary) hypertension: Secondary | ICD-10-CM

## 2020-07-10 ENCOUNTER — Other Ambulatory Visit: Payer: Self-pay | Admitting: Emergency Medicine

## 2020-07-31 ENCOUNTER — Other Ambulatory Visit: Payer: Self-pay | Admitting: Emergency Medicine

## 2020-07-31 DIAGNOSIS — E7800 Pure hypercholesterolemia, unspecified: Secondary | ICD-10-CM

## 2020-08-07 ENCOUNTER — Other Ambulatory Visit: Payer: Self-pay | Admitting: Emergency Medicine

## 2020-08-27 ENCOUNTER — Other Ambulatory Visit: Payer: Self-pay

## 2020-08-27 ENCOUNTER — Ambulatory Visit: Payer: 59 | Admitting: Emergency Medicine

## 2020-08-27 ENCOUNTER — Encounter: Payer: Self-pay | Admitting: Emergency Medicine

## 2020-08-27 VITALS — BP 110/68 | HR 58 | Temp 97.7°F | Ht 70.0 in | Wt 156.0 lb

## 2020-08-27 DIAGNOSIS — E785 Hyperlipidemia, unspecified: Secondary | ICD-10-CM | POA: Diagnosis not present

## 2020-08-27 DIAGNOSIS — I152 Hypertension secondary to endocrine disorders: Secondary | ICD-10-CM

## 2020-08-27 DIAGNOSIS — E1169 Type 2 diabetes mellitus with other specified complication: Secondary | ICD-10-CM

## 2020-08-27 DIAGNOSIS — E1159 Type 2 diabetes mellitus with other circulatory complications: Secondary | ICD-10-CM

## 2020-08-27 LAB — POCT GLYCOSYLATED HEMOGLOBIN (HGB A1C): Hemoglobin A1C: 6.3 % — AB (ref 4.0–5.6)

## 2020-08-27 NOTE — Assessment & Plan Note (Signed)
Diet and nutrition discussed.  Continue atorvastatin 80 mg daily. 

## 2020-08-27 NOTE — Patient Instructions (Signed)

## 2020-08-27 NOTE — Assessment & Plan Note (Signed)
Well-controlled hypertension.  Continue lisinopril, hydrochlorothiazide and metoprolol tartrate. Well-controlled diabetes with hemoglobin A1c of 6.3.  Continue metformin Tradjenta and Jardiance. Diet and nutrition discussed. Follow-up in 6 months.

## 2020-08-27 NOTE — Progress Notes (Signed)
Seth Pope 70 y.o.   Chief Complaint  Patient presents with   Hypertension    BP and diabetic follow up   Lab Results  Component Value Date   HGBA1C 6.6 (A) 02/27/2020   BP Readings from Last 3 Encounters:  02/27/20 111/69  09/16/19 (!) 146/82  09/11/19 (!) 132/80    HISTORY OF PRESENT ILLNESS: This is a 70 y.o. male with history of diabetes and hypertension here for follow-up. Doing well.  Has no complaints or medical concerns today. Last office visit assessment and plan as follows: ASSESSMENT & PLAN: Hypertension associated with diabetes ([place]) Well-controlled hypertension.  Continue present medications.  No changes. Well-controlled diabetes with hemoglobin A1c is 6.6.  Continue present medications.  No changes. Diet and nutrition discussed. Continue atorvastatin. Follow-up in 6 months.  Hypertension Pertinent negatives include no chest pain, headaches, palpitations or shortness of breath.  #1 hypertension: Presently taking metoprolol or tartrate 100 mg twice a day, lisinopril 10 mg daily and hydrochlorothiazide 12.5 mg daily. #2 diabetes: Presently on Tradjenta 5 mg, metformin, and Jardiance 10 mg daily. #3 dyslipidemia: On atorvastatin 80 mg daily.  Prior to Admission medications   Medication Sig Start Date End Date Taking? Authorizing Provider  ALPRAZolam (XANAX XR) 1 MG 24 hr tablet Take 1 mg by mouth daily.    [provider]  ALPRAZolam Duanne Moron) 0.25 MG tablet Take 0.25 mg by mouth 2 (two) times daily as needed for anxiety.  03/16/16   [provider]  atorvastatin (LIPITOR) 80 MG tablet TAKE 1 TABLET(80 MG) BY MOUTH DAILY 08/02/20   Horald Pollen, MD  desipramine (NORPRAMIN) 50 MG tablet Take 150 mg by mouth at bedtime.     [provider]  gabapentin (NEURONTIN) 300 MG capsule TAKE 2 CAPSULES(600 MG) BY MOUTH TWICE DAILY 01/22/20   Namir Neto, Ines Bloomer, MD  glucose blood (ONETOUCH VERIO) test strip USE TO CHECK BLOOD SUGAR UP TO  THREE TIMES DAILY. 07/23/19   Horald Pollen, MD  hydrochlorothiazide (MICROZIDE) 12.5 MG capsule TAKE 1 CAPSULE(12.5 MG) BY MOUTH DAILY 07/06/20   Horald Pollen, MD  HYDROcodone-acetaminophen (NORCO/VICODIN) 5-325 MG tablet Take 1 tablet by mouth every 6 (six) hours as needed for moderate pain. 09/16/19   Johnathan Hausen, MD  JARDIANCE 10 MG TABS tablet TAKE 1 TABLET(10 MG) BY MOUTH DAILY BEFORE BREAKFAST 08/08/20   Mauricia Mertens, Ines Bloomer, MD  lidocaine (LIDODERM) 5 % Place 1 patch onto the skin daily. Remove & Discard patch within 12 hours or as directed by MD Patient taking differently: Place 0.5 patches onto the skin daily as needed (pain). Remove & Discard patch within 12 hours or as directed by MD 06/28/19   Mauri Pole, MD  lisinopril (ZESTRIL) 10 MG tablet TAKE 1 TABLET(10 MG) BY MOUTH DAILY 07/10/20   Horald Pollen, MD  metFORMIN (GLUCOPHAGE-XR) 500 MG 24 hr tablet TAKE 2 TABLETS(1000 MG) BY MOUTH IN THE MORNING AND AT BEDTIME 06/10/20   Horald Pollen, MD  metoprolol tartrate (LOPRESSOR) 100 MG tablet TAKE 1 TABLET(100 MG) BY MOUTH TWICE DAILY 06/10/20   Horald Pollen, MD  [facility] DELICA LANCETS FINE MISC USE AS DIRECTED TO CHECK BLOOD GLUCOSE UP TO THREE TIMES DAILY 12/14/15   Wardell Honour, MD  TRADJENTA 5 MG TABS tablet TAKE 1 TABLET(5 MG) BY MOUTH DAILY 06/10/20   Horald Pollen, MD    Allergies  Allergen Reactions   Penicillins Rash    Has patient had a PCN  reaction causing immediate rash, facial/tongue/throat swelling, SOB or lightheadedness with hypotension: Yes Has patient had a PCN reaction causing severe rash involving mucus membranes or skin necrosis: No Has patient had a PCN reaction that required hospitalization No Has patient had a PCN reaction occurring within the last 10 years: No If all of the above answers are "NO", then may proceed with Cephalosporins. Occurred in childhood.     Patient Active Problem List   Diagnosis  Date Noted   Right inguinal hernia 08/14/2019   History of shingles 02/28/2017   Post herpetic neuralgia 02/28/2017   Family history of coronary artery disease in father 10/23/2013   Tobacco abuse 10/17/2013   Unstable angina ([place]) 10/04/2013   PVC's (premature ventricular contractions) 10/04/2013   Neuropathy, diabetic ([place]) 01/31/2012   Hypertension associated with diabetes ([place])    Dyslipidemia associated with type 2 diabetes mellitus ([place])    Hyperlipidemia     Past Medical History:  Diagnosis Date   Anxiety    Arthritis    hands   CAD (coronary artery disease)    a. cath 10/23/13: nonobstructive CAD, nl LV function, rec risk factor modification   Chronic kidney disease    h/o kidney stone - passed stone   Diabetes mellitus    type 2   History of pneumonia    Hyperlipidemia    Hypertension    Liver mass    Sinus bradycardia    a. as low as 36 bpm; b. multiple pauses 2.1-2.3 seconds   Ulcer    Umbilical hernia     Past Surgical History:  Procedure Laterality Date   BACK SURGERY     CERVICAL SPINE SURGERY     COLONOSCOPY  2015   Dr Collene Mares 3 TAs, tics, hems   INGUINAL HERNIA REPAIR Bilateral 09/16/2019   Procedure: LAPAROSCOPIC BILATERAL INGUINAL HERNIA;  Surgeon: Johnathan Hausen, MD;  Location: [facility];  Service: General;  Laterality: Bilateral;   KNEE SURGERY Right 1970   LEFT HEART CATHETERIZATION WITH CORONARY ANGIOGRAM N/A 10/23/2013   Procedure: LEFT HEART CATHETERIZATION WITH CORONARY ANGIOGRAM;  Surgeon: Peter M Martinique, MD;  Location: [facility] CATH LAB;  Service: Cardiovascular;  Laterality: N/A;   MICROLARYNGOSCOPY N/A 09/23/2015   Procedure: MICROLARYNGOSCOPY REMOVAL OF LESION;  Surgeon: Izora Gala, MD;  Location: [place];  Service: ENT;  Laterality: N/A;   NM [place]  08/2010   bruce myoview - normal perfusion in all regions, EF 66%, EKG negative for ischemia, no wall motion abnormalities, normal/low risk study                                        ROTATOR  CUFF REPAIR Bilateral 3354   x2   UMBILICAL HERNIA REPAIR  1988    Social History   Socioeconomic History   Marital status: Married    Spouse name: Abigail Butts   Number of children: 2   Years of education: 10th    Highest education level: Not on file  Occupational History   Occupation: self-employed (Investment banker, operational)  Tobacco Use   Smoking status: Former    Packs/day: 2.50    Years: 18.00    Pack years: 45.00    Types: Cigarettes    Quit date: 07/16/1986    Years since quitting: 34.1   Smokeless tobacco: Never  Vaping Use   Vaping Use: Never used  Substance and Sexual Activity  Alcohol use: Not Currently   Drug use: Not Currently    Types: Marijuana    Comment: Last use May 2021   Sexual activity: Yes  Other Topics Concern   Not on file  Social History Narrative   Lives at home w/ his wife   Right-handed   Daily caffeine   Social Determinants of Health   Financial Resource Strain: Not on file  Food Insecurity: Not on file  Transportation Needs: Not on file  Physical Activity: Not on file  Stress: Not on file  Social Connections: Not on file  Intimate Partner Violence: Not on file    Family History  Problem Relation Age of Onset   Heart attack Father    Hypertension Father    Heart disease Father    Diabetes Sister        HTN   Diabetes Brother        HTN, heart problems   Hypertension Other    Diabetes Paternal Grandmother    Heart disease Paternal Grandfather    Colon cancer Neg Hx    Rectal cancer Neg Hx    Stomach cancer Neg Hx      Review of Systems  Constitutional: Negative.  Negative for chills and fever.  HENT: Negative.  Negative for congestion and sore throat.   Respiratory: Negative.  Negative for cough and shortness of breath.   Cardiovascular: Negative.  Negative for chest pain and palpitations.  Gastrointestinal:  Negative for abdominal pain, diarrhea, nausea and vomiting.  Genitourinary: Negative.  Negative for dysuria.   Musculoskeletal: Negative.   Skin: Negative.  Negative for rash.  Neurological: Negative.  Negative for dizziness and headaches.  All other systems reviewed and are negative.  Today's Vitals   08/27/20 0914  BP: 110/68  Pulse: (!) 58  Temp: 97.7 F (36.5 C)  TempSrc: Oral  SpO2: 98%  Weight: 156 lb (70.8 kg)  Height: 5\' 10"  (1.778 m)   Body mass index is 22.38 kg/m. Wt Readings from Last 3 Encounters:  08/27/20 156 lb (70.8 kg)  02/27/20 163 lb (73.9 kg)  09/16/19 170 lb 3 oz (77.2 kg)   Lab Results  Component Value Date   CREATININE 0.79 02/27/2020   BUN 12 02/27/2020   NA 139 02/27/2020   K 4.6 02/27/2020   CL 104 02/27/2020   CO2 21 02/27/2020    Physical Exam Vitals reviewed.  Constitutional:      Appearance: Normal appearance.  HENT:     Head: Normocephalic.     Right Ear: Tympanic membrane, ear canal and external ear normal.     Left Ear: Tympanic membrane, ear canal and external ear normal.  Eyes:     Extraocular Movements: Extraocular movements intact.     Pupils: Pupils are equal, round, and reactive to light.  Cardiovascular:     Rate and Rhythm: Normal rate and regular rhythm.     Pulses: Normal pulses.     Heart sounds: Normal heart sounds.  Pulmonary:     Effort: Pulmonary effort is normal.     Breath sounds: Normal breath sounds.  Musculoskeletal:        General: Normal range of motion.     Cervical back: Normal range of motion and neck supple. No tenderness.     Right lower leg: No edema.     Left lower leg: No edema.  Lymphadenopathy:     Cervical: No cervical adenopathy.  Skin:    General: Skin is warm and dry.  Capillary Refill: Capillary refill takes less than 2 seconds.  Neurological:     General: No focal deficit present.     Mental Status: He is alert and oriented to person, place, and time.  Psychiatric:        Mood and Affect: Mood normal.        Behavior: Behavior normal.    Results for orders placed or performed in  visit on 08/27/20 (from the past 24 hour(s))  POCT glycosylated hemoglobin (Hb A1C)     Status: Abnormal   Collection Time: 08/27/20  9:31 AM  Result Value Ref Range   Hemoglobin A1C 6.3 (A) 4.0 - 5.6 %   HbA1c POC (<> result, manual entry)     HbA1c, POC (prediabetic range)     HbA1c, POC (controlled diabetic range)      ASSESSMENT & PLAN: A total of 30 minutes was spent with the patient and counseling/coordination of care regarding preparing for this visit, review of most recent office visit notes, review of all medications, review of most recent blood work results including today's hemoglobin A1c, cardiovascular risks associated with diabetes and hypertension, education on nutrition, health maintenance items including review of most recent colonoscopy report in 2021, documentation, prognosis and need for follow-up.  Hypertension associated with diabetes ([place]) Well-controlled hypertension.  Continue lisinopril, hydrochlorothiazide and metoprolol tartrate. Well-controlled diabetes with hemoglobin A1c of 6.3.  Continue metformin Tradjenta and Jardiance. Diet and nutrition discussed. Follow-up in 6 months.  Dyslipidemia associated with type 2 diabetes mellitus ([place]) Diet and nutrition discussed.  Continue atorvastatin 80 mg daily.  Beverley was seen today for hypertension.  Diagnoses and all orders for this visit:  Hypertension associated with diabetes ([place]) -     POCT glycosylated hemoglobin (Hb A1C)  Dyslipidemia associated with type 2 diabetes mellitus [facility])  Patient Instructions  Health Maintenance After Age 47 After age 38, you are at a higher risk for certain long-term diseases and infections as well as injuries from falls. Falls are a major cause of broken bones and head injuries in people who are older than age 43. Getting regular preventive care can help to keep you healthy and well. Preventive care includes getting regular testing and making lifestyle changes as recommended by  your health care provider. Talk with your health care provider about: Which screenings and tests you should have. A screening is a test that checks for a disease when you have no symptoms. A diet and exercise plan that is right for you. What should I know about screenings and tests to prevent falls? Screening and testing are the best ways to find a health problem early. Early diagnosis and treatment give you the best chance of managing medical conditions that are common after age 23. Certain conditions and lifestyle choices may make you more likely to have a fall. Your health care provider may recommend: Regular vision checks. Poor vision and conditions such as cataracts can make you more likely to have a fall. If you wear glasses, make sure to get your prescription updated if your vision changes. Medicine review. Work with your health care provider to regularly review all of the medicines you are taking, including over-the-counter medicines. Ask your health care provider about any side effects that may make you more likely to have a fall. Tell your health care provider if any medicines that you take make you feel dizzy or sleepy. Osteoporosis screening. Osteoporosis is a condition that causes the bones to get weaker. This can make the  bones weak and cause them to break more easily. Blood pressure screening. Blood pressure changes and medicines to control blood pressure can make you feel dizzy. Strength and balance checks. Your health care provider may recommend certain tests to check your strength and balance while standing, walking, or changing positions. Foot health exam. Foot pain and numbness, as well as not wearing proper footwear, can make you more likely to have a fall. Depression screening. You may be more likely to have a fall if you have a fear of falling, feel emotionally low, or feel unable to do activities that you used to do. Alcohol use screening. Using too much alcohol can affect your  balance and may make you more likely to have a fall. What actions can I take to lower my risk of falls? General instructions Talk with your health care provider about your risks for falling. Tell your health care provider if: You fall. Be sure to tell your health care provider about all falls, even ones that seem minor. You feel dizzy, sleepy, or off-balance. Take over-the-counter and prescription medicines only as told by your health care provider. These include any supplements. Eat a healthy diet and maintain a healthy weight. A healthy diet includes low-fat dairy products, low-fat (lean) meats, and fiber from whole grains, beans, and lots of fruits and vegetables. Home safety Remove any tripping hazards, such as rugs, cords, and clutter. Install safety equipment such as grab bars in bathrooms and safety rails on stairs. Keep rooms and walkways well-lit. Activity  Follow a regular exercise program to stay fit. This will help you maintain your balance. Ask your health care provider what types of exercise are appropriate for you. If you need a cane or walker, use it as recommended by your health care provider. Wear supportive shoes that have nonskid soles.  Lifestyle Do not drink alcohol if your health care provider tells you not to drink. If you drink alcohol, limit how much you have: 0-1 drink a day for women. 0-2 drinks a day for men. Be aware of how much alcohol is in your drink. In the [place] one drink equals one typical bottle of beer (12 oz), one-half glass of wine (5 oz), or one shot of hard liquor (1 oz). Do not use any products that contain nicotine or tobacco, such as cigarettes and e-cigarettes. If you need help quitting, ask your health care provider. Summary Having a healthy lifestyle and getting preventive care can help to protect your health and wellness after age 74. Screening and testing are the best way to find a health problem early and help you avoid having a fall.  Early diagnosis and treatment give you the best chance for managing medical conditions that are more common for people who are older than age 65. Falls are a major cause of broken bones and head injuries in people who are older than age 71. Take precautions to prevent a fall at home. Work with your health care provider to learn what changes you can make to improve your health and wellness and to prevent falls. This information is not intended to replace advice given to you by your health care provider. Make sure you discuss any questions you have with your healthcare provider. Document Revised: 01/17/2020 Document Reviewed: 01/17/2020 Elsevier Patient Education  2022 [place], [place] [facility]

## 2020-10-26 ENCOUNTER — Telehealth: Payer: Self-pay

## 2020-10-26 NOTE — Telephone Encounter (Signed)
Please advise as the pt has stated he has tested POS for COVID.Marland KitchenMarland Kitchen His sxs are Fatigue, headache and body aches.  SXS started on Saturday and Tested POS on yesterday.  **Pt is wanting to know if he can have a rx for Paxlovid.

## 2020-10-27 ENCOUNTER — Other Ambulatory Visit: Payer: Self-pay | Admitting: Emergency Medicine

## 2020-10-27 DIAGNOSIS — U071 COVID-19: Secondary | ICD-10-CM

## 2020-10-27 MED ORDER — NIRMATRELVIR/RITONAVIR (PAXLOVID)TABLET: 3.0000 | ORAL_TABLET | Freq: Two times a day (BID) | ORAL | 0 refills | Status: AC

## 2020-10-27 NOTE — Telephone Encounter (Signed)
Prescription for Paxlovid sent to pharmacy of record.  Thanks.

## 2020-10-27 NOTE — Telephone Encounter (Signed)
Please advise as the pt has called again this morning in regards to his POS COVID results... His sxs are Fatigue, headache and body aches a/w sore throat and some chest tightness.   SXS started on Saturday and Tested POS on Sunday.   **Pt is wanting to know if he can have a rx for Paxlovid to help with his above sxs.

## 2020-10-27 NOTE — Telephone Encounter (Signed)
Called and spoke to pt, he states he received medication.

## 2020-10-31 ENCOUNTER — Other Ambulatory Visit: Payer: Self-pay | Admitting: Emergency Medicine

## 2020-10-31 DIAGNOSIS — E7800 Pure hypercholesterolemia, unspecified: Secondary | ICD-10-CM

## 2020-12-07 ENCOUNTER — Other Ambulatory Visit: Payer: Self-pay | Admitting: Emergency Medicine

## 2020-12-07 DIAGNOSIS — I10 Essential (primary) hypertension: Secondary | ICD-10-CM

## 2020-12-07 DIAGNOSIS — E1165 Type 2 diabetes mellitus with hyperglycemia: Secondary | ICD-10-CM

## 2020-12-07 DIAGNOSIS — E1159 Type 2 diabetes mellitus with other circulatory complications: Secondary | ICD-10-CM

## 2020-12-07 DIAGNOSIS — I152 Hypertension secondary to endocrine disorders: Secondary | ICD-10-CM

## 2020-12-08 ENCOUNTER — Other Ambulatory Visit: Payer: Self-pay | Admitting: Emergency Medicine

## 2020-12-08 DIAGNOSIS — E119 Type 2 diabetes mellitus without complications: Secondary | ICD-10-CM

## 2021-01-03 ENCOUNTER — Other Ambulatory Visit: Payer: Self-pay | Admitting: Emergency Medicine

## 2021-01-03 DIAGNOSIS — I10 Essential (primary) hypertension: Secondary | ICD-10-CM

## 2021-01-06 ENCOUNTER — Other Ambulatory Visit: Payer: Self-pay | Admitting: Emergency Medicine

## 2021-01-19 ENCOUNTER — Other Ambulatory Visit: Payer: Self-pay | Admitting: Emergency Medicine

## 2021-01-19 DIAGNOSIS — E7800 Pure hypercholesterolemia, unspecified: Secondary | ICD-10-CM

## 2021-03-02 ENCOUNTER — Other Ambulatory Visit: Payer: Self-pay

## 2021-03-02 ENCOUNTER — Encounter: Payer: Self-pay | Admitting: Emergency Medicine

## 2021-03-02 ENCOUNTER — Ambulatory Visit: Payer: 59 | Admitting: Emergency Medicine

## 2021-03-02 VITALS — BP 106/62 | HR 60 | Temp 98.0°F | Ht 70.0 in | Wt 155.0 lb

## 2021-03-02 DIAGNOSIS — I152 Hypertension secondary to endocrine disorders: Secondary | ICD-10-CM

## 2021-03-02 DIAGNOSIS — E785 Hyperlipidemia, unspecified: Secondary | ICD-10-CM

## 2021-03-02 DIAGNOSIS — E1159 Type 2 diabetes mellitus with other circulatory complications: Secondary | ICD-10-CM

## 2021-03-02 DIAGNOSIS — Z23 Encounter for immunization: Secondary | ICD-10-CM | POA: Diagnosis not present

## 2021-03-02 DIAGNOSIS — F411 Generalized anxiety disorder: Secondary | ICD-10-CM

## 2021-03-02 DIAGNOSIS — E1169 Type 2 diabetes mellitus with other specified complication: Secondary | ICD-10-CM

## 2021-03-02 DIAGNOSIS — E1142 Type 2 diabetes mellitus with diabetic polyneuropathy: Secondary | ICD-10-CM

## 2021-03-02 LAB — COMPREHENSIVE METABOLIC PANEL
ALT: 23 U/L (ref 0–53)
AST: 19 U/L (ref 0–37)
Albumin: 4.4 g/dL (ref 3.5–5.2)
Alkaline Phosphatase: 74 U/L (ref 39–117)
BUN: 18 mg/dL (ref 6–23)
CO2: 26 mEq/L (ref 19–32)
Calcium: 9.4 mg/dL (ref 8.4–10.5)
Chloride: 103 mEq/L (ref 96–112)
Creatinine, Ser: 0.86 mg/dL (ref 0.40–1.50)
GFR: 89.01 mL/min (ref >60.00)
Glucose, Bld: 122 mg/dL — ABNORMAL HIGH (ref 70–99)
Potassium: 4.3 mEq/L (ref 3.5–5.1)
Sodium: 138 mEq/L (ref 135–145)
Total Bilirubin: 0.5 mg/dL (ref 0.2–1.2)
Total Protein: 7.2 g/dL (ref 6.0–8.3)

## 2021-03-02 LAB — POCT GLYCOSYLATED HEMOGLOBIN (HGB A1C): Hemoglobin A1C: 6.7 % — AB (ref 4.0–5.6)

## 2021-03-02 LAB — LIPID PANEL
Cholesterol: 119 mg/dL (ref 0–200)
HDL: 51.4 mg/dL (ref >39.00)
LDL Cholesterol: 50 mg/dL (ref 0–99)
NonHDL: 67.72
Total CHOL/HDL Ratio: 2
Triglycerides: 89 mg/dL (ref 0.0–149.0)
VLDL: 17.8 mg/dL (ref 0.0–40.0)

## 2021-03-02 NOTE — Assessment & Plan Note (Signed)
Stable.  Diet and nutrition discussed.  Lipid profile done today. Continue atorvastatin 80 mg daily.

## 2021-03-02 NOTE — Assessment & Plan Note (Signed)
Stable.  Asymptomatic on gabapentin 300 mg twice a day.

## 2021-03-02 NOTE — Progress Notes (Signed)
Seth Pope 71 y.o.   Chief Complaint  Patient presents with   Diabetes   Hypertension    F/U    HISTORY OF PRESENT ILLNESS: This is a 71 y.o. male with history of diabetes and hypertension here for follow-up. Doing well.  Has no complaints or medical concerns. 1.  Diabetes: On metformin, Tradjenta, and Jardiance 2.  Hypertension on metoprolol tartrate and lisinopril and hydrochlorothiazide 3.  Dyslipidemia: On Lipitor 4.  Generalized anxiety disorder: Takes alprazolam on a daily basis and sees psychiatrist on a regular basis who is prescribing his medications.  HPI   Prior to Admission medications   Medication Sig Start Date End Date Taking? Authorizing Provider  ALPRAZolam (XANAX XR) 1 MG 24 hr tablet Take 1 mg by mouth daily.   Yes [provider]  ALPRAZolam (XANAX) 0.25 MG tablet Take 0.25 mg by mouth 2 (two) times daily as needed for anxiety.  03/16/16  Yes [provider]  atorvastatin (LIPITOR) 80 MG tablet TAKE 1 TABLET(80 MG) BY MOUTH DAILY 01/19/21  Yes Haileyann Staiger, Ines Bloomer, MD  desipramine (NORPRAMIN) 50 MG tablet Take 150 mg by mouth at bedtime.    Yes [provider]  gabapentin (NEURONTIN) 300 MG capsule TAKE 2 CAPSULES(600 MG) BY MOUTH TWICE DAILY 12/08/20  Yes Jewelia Bocchino, [place], MD  glucose blood (ONETOUCH VERIO) test strip USE TO CHECK BLOOD SUGAR UP TO THREE TIMES DAILY. 07/23/19  Yes Soleia Badolato, Ines Bloomer, MD  hydrochlorothiazide (MICROZIDE) 12.5 MG capsule TAKE 1 CAPSULE(12.5 MG) BY MOUTH DAILY 01/03/21  Yes Rudi Bunyard, Ines Bloomer, MD  JARDIANCE 10 MG TABS tablet TAKE 1 TABLET(10 MG) BY MOUTH DAILY BEFORE BREAKFAST 08/08/20  Yes Dabria Wadas, Ines Bloomer, MD  lisinopril (ZESTRIL) 10 MG tablet TAKE 1 TABLET(10 MG) BY MOUTH DAILY 01/06/21  Yes Gaylene Moylan, Ines Bloomer, MD  metFORMIN (GLUCOPHAGE-XR) 500 MG 24 hr tablet TAKE 2 TABLETS(1000 MG) BY MOUTH IN THE MORNING AND AT BEDTIME 12/07/20  Yes Vanda Waskey, Ines Bloomer, MD  metoprolol tartrate  (LOPRESSOR) 100 MG tablet TAKE 1 TABLET(100 MG) BY MOUTH TWICE DAILY 12/07/20  Yes [place], Ines Bloomer, MD  ONETOUCH DELICA LANCETS FINE MISC USE AS DIRECTED TO CHECK BLOOD GLUCOSE UP TO THREE TIMES DAILY 12/14/15  Yes Wardell Honour, MD  TRADJENTA 5 MG TABS tablet TAKE 1 TABLET(5 MG) BY MOUTH DAILY 12/07/20  Yes Myiah Petkus, Ines Bloomer, MD  lidocaine (LIDODERM) 5 % Place 1 patch onto the skin daily. Remove & Discard patch within 12 hours or as directed by MD Patient taking differently: Place 0.5 patches onto the skin daily as needed (pain). Remove & Discard patch within 12 hours or as directed by MD 06/28/19   Mauri Pole, MD    Allergies  Allergen Reactions   Penicillins Rash    Has patient had a PCN reaction causing immediate rash, facial/tongue/throat swelling, SOB or lightheadedness with hypotension: Yes Has patient had a PCN reaction causing severe rash involving mucus membranes or skin necrosis: No Has patient had a PCN reaction that required hospitalization No Has patient had a PCN reaction occurring within the last 10 years: No If all of the above answers are "NO", then may proceed with Cephalosporins. Occurred in childhood.     Patient Active Problem List   Diagnosis Date Noted   Right inguinal hernia 08/14/2019   History of shingles 02/28/2017   Post herpetic neuralgia 02/28/2017   Family history of coronary artery disease in father 10/23/2013   Tobacco abuse 10/17/2013   Unstable  angina ([place]) 10/04/2013   PVC's (premature ventricular contractions) 10/04/2013   Neuropathy, diabetic ([place]) 01/31/2012   Hypertension associated with diabetes ([place])    Dyslipidemia associated with type 2 diabetes mellitus ([place])    Hyperlipidemia     Past Medical History:  Diagnosis Date   Anxiety    Arthritis    hands   CAD (coronary artery disease)    a. cath 10/23/13: nonobstructive CAD, nl LV function, rec risk factor modification   Chronic kidney disease    h/o kidney stone -  passed stone   Diabetes mellitus    type 2   History of pneumonia    Hyperlipidemia    Hypertension    Liver mass    Sinus bradycardia    a. as low as 36 bpm; b. multiple pauses 2.1-2.3 seconds   Ulcer    Umbilical hernia     Past Surgical History:  Procedure Laterality Date   BACK SURGERY     CERVICAL SPINE SURGERY     COLONOSCOPY  2015   Dr Collene Mares 3 TAs, tics, hems   INGUINAL HERNIA REPAIR Bilateral 09/16/2019   Procedure: LAPAROSCOPIC BILATERAL INGUINAL HERNIA;  Surgeon: Johnathan Hausen, MD;  Location: [facility];  Service: General;  Laterality: Bilateral;   KNEE SURGERY Right [place] N/A 10/23/2013   Procedure: LEFT HEART CATHETERIZATION WITH CORONARY ANGIOGRAM;  Surgeon: Peter M Martinique, MD;  Location: [facility] CATH LAB;  Service: Cardiovascular;  Laterality: N/A;   MICROLARYNGOSCOPY N/A 09/23/2015   Procedure: MICROLARYNGOSCOPY REMOVAL OF LESION;  Surgeon: Izora Gala, MD;  Location: [facility];  Service: ENT;  Laterality: N/A;   [facility] [place]  08/2010   bruce myoview - normal perfusion in all regions, EF 66%, EKG negative for ischemia, no wall motion abnormalities, normal/low risk study                                        ROTATOR CUFF REPAIR Bilateral 3354   x2   UMBILICAL HERNIA REPAIR  1988    Social History   Socioeconomic History   Marital status: Married    Spouse name: Abigail Butts   Number of children: 2   Years of education: 10th    Highest education level: Not on file  Occupational History   Occupation: self-employed (Investment banker, operational)  Tobacco Use   Smoking status: Former    Packs/day: 2.50    Years: 18.00    Pack years: 45.00    Types: Cigarettes    Quit date: 07/16/1986    Years since quitting: 34.6   Smokeless tobacco: Never  Vaping Use   Vaping Use: Never used  Substance and Sexual Activity   Alcohol use: Not Currently   Drug use: Not Currently    Types: Marijuana    Comment: Last use May 2021   Sexual  activity: Yes  Other Topics Concern   Not on file  Social History Narrative   Lives at home w/ his wife   Right-handed   Daily caffeine   Social Determinants of Health   Financial Resource Strain: Not on file  Food Insecurity: Not on file  Transportation Needs: Not on file  Physical Activity: Not on file  Stress: Not on file  Social Connections: Not on file  Intimate Partner Violence: Not on file    Family History  Problem Relation Age of  Onset   Heart attack Father    Hypertension Father    Heart disease Father    Diabetes Sister        HTN   Diabetes Brother        HTN, heart problems   Hypertension Other    Diabetes Paternal Grandmother    Heart disease Paternal Grandfather    Colon cancer Neg Hx    Rectal cancer Neg Hx    Stomach cancer Neg Hx      Review of Systems  Constitutional: Negative.  Negative for chills and fever.  HENT: Negative.  Negative for congestion and sore throat.   Respiratory: Negative.  Negative for cough and shortness of breath.   Cardiovascular: Negative.  Negative for chest pain and palpitations.  Gastrointestinal: Negative.  Negative for abdominal pain, diarrhea, nausea and vomiting.  Genitourinary: Negative.  Negative for dysuria and hematuria.  Musculoskeletal: Negative.  Negative for back pain, myalgias and neck pain.  Skin: Negative.  Negative for rash.  Neurological: Negative.  Negative for dizziness and headaches.  All other systems reviewed and are negative.  Wt Readings from Last 3 Encounters:  03/02/21 155 lb (70.3 kg)  08/27/20 156 lb (70.8 kg)  02/27/20 163 lb (73.9 kg)    Physical Exam Vitals reviewed.  Constitutional:      Appearance: Normal appearance.  HENT:     Head: Normocephalic.  Eyes:     Extraocular Movements: Extraocular movements intact.     Conjunctiva/sclera: Conjunctivae normal.     Pupils: Pupils are equal, round, and reactive to light.  Cardiovascular:     Rate and Rhythm: Normal rate and  regular rhythm.     Pulses: Normal pulses.     Heart sounds: Normal heart sounds.  Pulmonary:     Effort: Pulmonary effort is normal.     Breath sounds: Normal breath sounds.  Abdominal:     Palpations: Abdomen is soft.     Tenderness: There is no abdominal tenderness.  Musculoskeletal:     Cervical back: Neck supple. No tenderness.     Right lower leg: No edema.     Left lower leg: No edema.  Lymphadenopathy:     Cervical: No cervical adenopathy.  Skin:    General: Skin is warm and dry.     Capillary Refill: Capillary refill takes less than 2 seconds.  Neurological:     General: No focal deficit present.     Mental Status: He is alert and oriented to person, place, and time.  Psychiatric:        Mood and Affect: Mood normal.        Behavior: Behavior normal.    Lab Results  Component Value Date   HGBA1C 6.7 (A) 03/02/2021    ASSESSMENT & PLAN: Problem List Items Addressed This Visit       Cardiovascular and Mediastinum   Hypertension associated with diabetes ([place]) - Primary    Well-controlled hypertension.  Continue metoprolol tartrate 100 mg twice a day, amlodipine 10 mg daily, and hydrochlorothiazide 12.5 mg daily. BP Readings from Last 3 Encounters:  03/02/21 106/62  08/27/20 110/68  02/27/20 111/69  Well-controlled diabetes with hemoglobin A1c of 6.7. Continue metformin 2000 mg daily. Diet and nutrition discussed.       Relevant Orders   POCT glycosylated hemoglobin (Hb A1C) (Completed)   Comprehensive metabolic panel     Endocrine   Dyslipidemia associated with type 2 diabetes mellitus ([facility])    Stable.  Diet and nutrition  discussed.  Lipid profile done today. Continue atorvastatin 80 mg daily.       Relevant Orders   Lipid panel   Neuropathy, diabetic ([place])    Stable.  Asymptomatic on gabapentin 300 mg twice a day.        Other   Generalized anxiety disorder    Diagnosed many years ago.  Started on long-term daily benzodiazepine  treatment. Sees psychiatrist on a regular basis for evaluation and medication refills.      Other Visit Diagnoses     Need for Tdap vaccination       Relevant Orders   Tdap vaccine greater than or equal to 7yo IM   Need for shingles vaccine       Relevant Orders   Varicella-zoster vaccine IM (Shingrix)      Patient Instructions  Diabetes Mellitus and Nutrition, Adult When you have diabetes, or diabetes mellitus, it is very important to have healthy eating habits because your blood sugar (glucose) levels are greatly affected by what you eat and drink. Eating healthy foods in the right amounts, at about the same times every day, can help you: Manage your blood glucose. Lower your risk of heart disease. Improve your blood pressure. Reach or maintain a healthy weight. What can affect my meal plan? Every person with diabetes is different, and each person has different needs for a meal plan. Your health care provider may recommend that you work with a dietitian to make a meal plan that is best for you. Your meal plan may vary depending on factors such as: The calories you need. The medicines you take. Your weight. Your blood glucose, blood pressure, and cholesterol levels. Your activity level. Other health conditions you have, such as heart or kidney disease. How do carbohydrates affect me? Carbohydrates, also called carbs, affect your blood glucose level more than any other type of food. Eating carbs raises the amount of glucose in your blood. It is important to know how many carbs you can safely have in each meal. This is different for every person. Your dietitian can help you calculate how many carbs you should have at each meal and for each snack. How does alcohol affect me? Alcohol can cause a decrease in blood glucose (hypoglycemia), especially if you use insulin or take certain diabetes medicines by mouth. Hypoglycemia can be a life-threatening condition. Symptoms of hypoglycemia,  such as sleepiness, dizziness, and confusion, are similar to symptoms of having too much alcohol. Do not drink alcohol if: Your health care provider tells you not to drink. You are pregnant, may be pregnant, or are planning to become pregnant. If you drink alcohol: Limit how much you have to: 0-1 drink a day for women. 0-2 drinks a day for men. Know how much alcohol is in your drink. In the [place] one drink equals one 12 oz bottle of beer (355 mL), one 5 oz glass of wine (148 mL), or one 1 oz glass of hard liquor (44 mL). Keep yourself hydrated with water, diet soda, or unsweetened iced tea. Keep in mind that regular soda, juice, and other mixers may contain a lot of sugar and must be counted as carbs. What are tips for following this plan? Reading food labels Start by checking the serving size on the Nutrition Facts label of packaged foods and drinks. The number of calories and the amount of carbs, fats, and other nutrients listed on the label are based on one serving of the item. Many items contain more  than one serving per package. Check the total grams (g) of carbs in one serving. Check the number of grams of saturated fats and trans fats in one serving. Choose foods that have a low amount or none of these fats. Check the number of milligrams (mg) of salt (sodium) in one serving. Most people should limit total sodium intake to less than 2,300 mg per day. Always check the nutrition information of foods labeled as "low-fat" or "nonfat." These foods may be higher in added sugar or refined carbs and should be avoided. Talk to your dietitian to identify your daily goals for nutrients listed on the label. Shopping Avoid buying canned, pre-made, or processed foods. These foods tend to be high in fat, sodium, and added sugar. Shop around the outside edge of the grocery store. This is where you will most often find fresh fruits and vegetables, bulk grains, fresh meats, and fresh dairy  products. Cooking Use low-heat cooking methods, such as baking, instead of high-heat cooking methods, such as deep frying. Cook using healthy oils, such as olive, canola, or sunflower oil. Avoid cooking with butter, cream, or high-fat meats. Meal planning Eat meals and snacks regularly, preferably at the same times every day. Avoid going long periods of time without eating. Eat foods that are high in fiber, such as fresh fruits, vegetables, beans, and whole grains. Eat 4-6 oz (112-168 g) of lean protein each day, such as lean meat, chicken, fish, eggs, or tofu. One ounce (oz) (28 g) of lean protein is equal to: 1 oz (28 g) of meat, chicken, or fish. 1 egg.  cup (62 g) of tofu. Eat some foods each day that contain healthy fats, such as avocado, nuts, seeds, and fish. What foods should I eat? Fruits Berries. Apples. Oranges. Peaches. Apricots. Plums. Grapes. Mangoes. Papayas. Pomegranates. Kiwi. Cherries. Vegetables Leafy greens, including lettuce, spinach, kale, chard, collard greens, mustard greens, and cabbage. Beets. Cauliflower. Broccoli. Carrots. Green beans. Tomatoes. Peppers. Onions. Cucumbers. Brussels sprouts. Grains Whole grains, such as whole-wheat or whole-grain bread, crackers, tortillas, cereal, and pasta. Unsweetened oatmeal. Quinoa. Brown or wild rice. Meats and other proteins Seafood. Poultry without skin. Lean cuts of poultry and beef. Tofu. Nuts. Seeds. Dairy Low-fat or fat-free dairy products such as milk, yogurt, and cheese. The items listed above may not be a complete list of foods and beverages you can eat and drink. Contact a dietitian for more information. What foods should I avoid? Fruits Fruits canned with syrup. Vegetables Canned vegetables. Frozen vegetables with butter or cream sauce. Grains Refined white flour and flour products such as bread, pasta, snack foods, and cereals. Avoid all processed foods. Meats and other proteins Fatty cuts of meat.  Poultry with skin. Breaded or fried meats. Processed meat. Avoid saturated fats. Dairy Full-fat yogurt, cheese, or milk. Beverages Sweetened drinks, such as soda or iced tea. The items listed above may not be a complete list of foods and beverages you should avoid. Contact a dietitian for more information. Questions to ask a health care provider Do I need to meet with a certified diabetes care and education specialist? Do I need to meet with a dietitian? What number can I call if I have questions? When are the best times to check my blood glucose? Where to find more information: American Diabetes Association: diabetes.org Academy of Nutrition and Dietetics: eatright.[employer] of Diabetes and Digestive and Kidney Diseases: [employer] Association of Diabetes Care & Education Specialists: diabeteseducator.org Summary It is important to have healthy  eating habits because your blood sugar (glucose) levels are greatly affected by what you eat and drink. It is important to use alcohol carefully. A healthy meal plan will help you manage your blood glucose and lower your risk of heart disease. Your health care provider may recommend that you work with a dietitian to make a meal plan that is best for you. This information is not intended to replace advice given to you by your health care provider. Make sure you discuss any questions you have with your health care provider. Document Revised: 09/04/2019 Document Reviewed: 09/04/2019 Elsevier Patient Education  2022 [place], MD [facility]

## 2021-03-02 NOTE — Assessment & Plan Note (Signed)
Well-controlled hypertension.  Continue metoprolol tartrate 100 mg twice a day, amlodipine 10 mg daily, and hydrochlorothiazide 12.5 mg daily. BP Readings from Last 3 Encounters:  03/02/21 106/62  08/27/20 110/68  02/27/20 111/69  Well-controlled diabetes with hemoglobin A1c of 6.7. Continue metformin 2000 mg daily. Diet and nutrition discussed.

## 2021-03-02 NOTE — Patient Instructions (Signed)

## 2021-03-02 NOTE — Assessment & Plan Note (Signed)
Diagnosed many years ago.  Started on long-term daily benzodiazepine treatment. Sees psychiatrist on a regular basis for evaluation and medication refills.

## 2021-03-08 ENCOUNTER — Telehealth: Payer: Self-pay | Admitting: Emergency Medicine

## 2021-03-08 NOTE — Telephone Encounter (Signed)
Patient calling in  Patient says he has pulled a muscle in his back & wants to know if provider can send over muscle relax to pharmacy  Please let patient know if able to be done [place], [address] [place] Phone:  [phone]  Fax:  [phone]

## 2021-03-09 ENCOUNTER — Other Ambulatory Visit: Payer: Self-pay | Admitting: Emergency Medicine

## 2021-03-09 MED ORDER — CYCLOBENZAPRINE HCL 10 MG PO TABS: 10.0000 mg | ORAL_TABLET | Freq: Every day | ORAL | 1 refills | Status: DC

## 2021-03-09 NOTE — Telephone Encounter (Signed)
Patient is requesting medication for back pain.

## 2021-03-09 NOTE — Telephone Encounter (Signed)
Prescription for muscle relaxant sent to pharmacy of record.  Thanks.

## 2021-04-06 ENCOUNTER — Other Ambulatory Visit: Payer: Self-pay | Admitting: Emergency Medicine

## 2021-04-06 DIAGNOSIS — E7800 Pure hypercholesterolemia, unspecified: Secondary | ICD-10-CM

## 2021-06-05 ENCOUNTER — Other Ambulatory Visit: Payer: Self-pay | Admitting: Emergency Medicine

## 2021-06-05 DIAGNOSIS — I152 Hypertension secondary to endocrine disorders: Secondary | ICD-10-CM

## 2021-06-05 DIAGNOSIS — E1165 Type 2 diabetes mellitus with hyperglycemia: Secondary | ICD-10-CM

## 2021-06-05 DIAGNOSIS — I10 Essential (primary) hypertension: Secondary | ICD-10-CM

## 2021-07-01 ENCOUNTER — Other Ambulatory Visit: Payer: Self-pay | Admitting: Emergency Medicine

## 2021-07-01 DIAGNOSIS — I10 Essential (primary) hypertension: Secondary | ICD-10-CM

## 2021-07-01 DIAGNOSIS — E119 Type 2 diabetes mellitus without complications: Secondary | ICD-10-CM

## 2021-07-05 ENCOUNTER — Other Ambulatory Visit: Payer: Self-pay | Admitting: Emergency Medicine

## 2021-07-05 DIAGNOSIS — E7800 Pure hypercholesterolemia, unspecified: Secondary | ICD-10-CM

## 2021-08-30 ENCOUNTER — Encounter: Payer: Self-pay | Admitting: Emergency Medicine

## 2021-08-30 ENCOUNTER — Ambulatory Visit (INDEPENDENT_AMBULATORY_CARE_PROVIDER_SITE_OTHER): Payer: 59 | Admitting: Emergency Medicine

## 2021-08-30 VITALS — BP 106/62 | HR 51 | Temp 97.8°F | Ht 70.0 in | Wt 159.2 lb

## 2021-08-30 DIAGNOSIS — E1142 Type 2 diabetes mellitus with diabetic polyneuropathy: Secondary | ICD-10-CM | POA: Diagnosis not present

## 2021-08-30 DIAGNOSIS — R2689 Other abnormalities of gait and mobility: Secondary | ICD-10-CM | POA: Insufficient documentation

## 2021-08-30 DIAGNOSIS — E1169 Type 2 diabetes mellitus with other specified complication: Secondary | ICD-10-CM | POA: Diagnosis not present

## 2021-08-30 DIAGNOSIS — E785 Hyperlipidemia, unspecified: Secondary | ICD-10-CM

## 2021-08-30 DIAGNOSIS — I152 Hypertension secondary to endocrine disorders: Secondary | ICD-10-CM

## 2021-08-30 DIAGNOSIS — E1159 Type 2 diabetes mellitus with other circulatory complications: Secondary | ICD-10-CM | POA: Diagnosis not present

## 2021-08-30 DIAGNOSIS — F411 Generalized anxiety disorder: Secondary | ICD-10-CM

## 2021-08-30 LAB — COMPREHENSIVE METABOLIC PANEL
ALT: 22 U/L (ref 0–53)
AST: 20 U/L (ref 0–37)
Albumin: 4.5 g/dL (ref 3.5–5.2)
Alkaline Phosphatase: 75 U/L (ref 39–117)
BUN: 20 mg/dL (ref 6–23)
CO2: 27 mEq/L (ref 19–32)
Calcium: 9.4 mg/dL (ref 8.4–10.5)
Chloride: 104 mEq/L (ref 96–112)
Creatinine, Ser: 0.88 mg/dL (ref 0.40–1.50)
GFR: 88.08 mL/min (ref >60.00)
Glucose, Bld: 138 mg/dL — ABNORMAL HIGH (ref 70–99)
Potassium: 5 mEq/L (ref 3.5–5.1)
Sodium: 139 mEq/L (ref 135–145)
Total Bilirubin: 0.5 mg/dL (ref 0.2–1.2)
Total Protein: 6.9 g/dL (ref 6.0–8.3)

## 2021-08-30 LAB — POCT GLYCOSYLATED HEMOGLOBIN (HGB A1C): Hemoglobin A1C: 6.7 % — AB (ref 4.0–5.6)

## 2021-08-30 LAB — MICROALBUMIN / CREATININE URINE RATIO
Creatinine,U: 54.3 mg/dL
Microalb Creat Ratio: 1.3 mg/g (ref 0.0–30.0)
Microalb, Ur: <0.7 mg/dL (ref 0.0–1.9)

## 2021-08-30 LAB — LIPID PANEL
Cholesterol: 130 mg/dL (ref 0–200)
HDL: 55.3 mg/dL (ref >39.00)
LDL Cholesterol: 50 mg/dL (ref 0–99)
NonHDL: 74.45
Total CHOL/HDL Ratio: 2
Triglycerides: 120 mg/dL (ref 0.0–149.0)
VLDL: 24 mg/dL (ref 0.0–40.0)

## 2021-08-30 LAB — VITAMIN D 25 HYDROXY (VIT D DEFICIENCY, FRACTURES): VITD: 30.05 ng/mL (ref 30.00–100.00)

## 2021-08-30 LAB — TSH: TSH: 2.18 u[IU]/mL (ref 0.35–5.50)

## 2021-08-30 LAB — VITAMIN B12: Vitamin B-12: 190 pg/mL — ABNORMAL LOW (ref 211–911)

## 2021-08-30 NOTE — Assessment & Plan Note (Signed)
Well-controlled and stable.  Sees psychiatrist on a regular basis who is managing his medications.

## 2021-08-30 NOTE — Assessment & Plan Note (Signed)
Stable.  Diet and nutrition discussed. Continue atorvastatin 80 mg daily. 

## 2021-08-30 NOTE — Assessment & Plan Note (Signed)
Differential diagnosis discussed.  Blood work done today. Clinically stable.  No red flag signs or symptoms. Needs neurology evaluation. Neurology referral placed today.

## 2021-08-30 NOTE — Patient Instructions (Signed)
Health Maintenance After Age 69 After age 69, you are at a higher risk for certain long-term diseases and infections as well as injuries from falls. Falls are a major cause of broken bones and head injuries in people who are older than age 69. Getting regular preventive care can help to keep you healthy and well. Preventive care includes getting regular testing and making lifestyle changes as recommended by your health care provider. Talk with your health care provider about: Which screenings and tests you should have. A screening is a test that checks for a disease when you have no symptoms. A diet and exercise plan that is right for you. What should I know about screenings and tests to prevent falls? Screening and testing are the best ways to find a health problem early. Early diagnosis and treatment give you the best chance of managing medical conditions that are common after age 69. Certain conditions and lifestyle choices may make you more likely to have a fall. Your health care provider may recommend: Regular vision checks. Poor vision and conditions such as cataracts can make you more likely to have a fall. If you wear glasses, make sure to get your prescription updated if your vision changes. Medicine review. Work with your health care provider to regularly review all of the medicines you are taking, including over-the-counter medicines. Ask your health care provider about any side effects that may make you more likely to have a fall. Tell your health care provider if any medicines that you take make you feel dizzy or sleepy. Strength and balance checks. Your health care provider may recommend certain tests to check your strength and balance while standing, walking, or changing positions. Foot health exam. Foot pain and numbness, as well as not wearing proper footwear, can make you more likely to have a fall. Screenings, including: Osteoporosis screening. Osteoporosis is a condition that causes  the bones to get weaker and break more easily. Blood pressure screening. Blood pressure changes and medicines to control blood pressure can make you feel dizzy. Depression screening. You may be more likely to have a fall if you have a fear of falling, feel depressed, or feel unable to do activities that you used to do. Alcohol use screening. Using too much alcohol can affect your balance and may make you more likely to have a fall. Follow these instructions at home: Lifestyle Do not drink alcohol if: Your health care provider tells you not to drink. If you drink alcohol: Limit how much you have to: 0-1 drink a day for women. 0-2 drinks a day for men. Know how much alcohol is in your drink. In the [place] one drink equals one 12 oz bottle of beer (355 mL), one 5 oz glass of wine (148 mL), or one 1 oz glass of hard liquor (44 mL). Do not use any products that contain nicotine or tobacco. These products include cigarettes, chewing tobacco, and vaping devices, such as e-cigarettes. If you need help quitting, ask your health care provider. Activity  Follow a regular exercise program to stay fit. This will help you maintain your balance. Ask your health care provider what types of exercise are appropriate for you. If you need a cane or walker, use it as recommended by your health care provider. Wear supportive shoes that have nonskid soles. Safety  Remove any tripping hazards, such as rugs, cords, and clutter. Install safety equipment such as grab bars in bathrooms and safety rails on stairs. Keep rooms and walkways   well-lit. General instructions Talk with your health care provider about your risks for falling. Tell your health care provider if: You fall. Be sure to tell your health care provider about all falls, even ones that seem minor. You feel dizzy, tiredness (fatigue), or off-balance. Take over-the-counter and prescription medicines only as told by your health care provider. These include  supplements. Eat a healthy diet and maintain a healthy weight. A healthy diet includes low-fat dairy products, low-fat (lean) meats, and fiber from whole grains, beans, and lots of fruits and vegetables. Stay current with your vaccines. Schedule regular health, dental, and eye exams. Summary Having a healthy lifestyle and getting preventive care can help to protect your health and wellness after age 69. Screening and testing are the best way to find a health problem early and help you avoid having a fall. Early diagnosis and treatment give you the best chance for managing medical conditions that are more common for people who are older than age 69. Falls are a major cause of broken bones and head injuries in people who are older than age 69. Take precautions to prevent a fall at home. Work with your health care provider to learn what changes you can make to improve your health and wellness and to prevent falls. This information is not intended to replace advice given to you by your health care provider. Make sure you discuss any questions you have with your health care provider. Document Revised: 06/22/2020 Document Reviewed: 06/22/2020 Elsevier Patient Education  2023 Elsevier Inc.  

## 2021-08-30 NOTE — Progress Notes (Signed)
Seth Pope 71 y.o.   Chief Complaint  Patient presents with   Follow-up    58mth f/u appt , balance issues with walking    Diabetes    HISTORY OF PRESENT ILLNESS: This is a 71y.o. male with history of hypertension, dyslipidemia, and diabetes here for 613-monthollow-up.  Overall doing well however has had a couple of episodes where he is walking and suddenly right knee feels weak and loses his balance.  Brief episodes without loss of consciousness.  Recovers well.  Denies pain to the knee.  Denies any other associated symptoms.  Sometimes feels like his balance is off.  No other complaints or medical concerns today. Remains physically active.  Good appetite.  Compliant with diet.  Compliant with medications. BP Readings from Last 3 Encounters:  08/30/21 106/62  03/02/21 106/62  08/27/20 110/68   Wt Readings from Last 3 Encounters:  08/30/21 159 lb 4 oz (72.2 kg)  03/02/21 155 lb (70.3 kg)  08/27/20 156 lb (70.8 kg)     Diabetes Pertinent negatives for hypoglycemia include no dizziness or headaches. Pertinent negatives for diabetes include no chest pain.     Prior to Admission medications   Medication Sig Start Date End Date Taking? Authorizing Provider  ALPRAZolam (XANAX XR) 1 MG 24 hr tablet Take 1 mg by mouth daily.   Yes [provider]  ALPRAZolam (XANAX) 0.25 MG tablet Take 0.25 mg by mouth 2 (two) times daily as needed for anxiety.  03/16/16  Yes [provider]  atorvastatin (LIPITOR) 80 MG tablet TAKE 1 TABLET(80 MG) BY MOUTH DAILY 07/05/21  Yes Olympia Adelsberger, MiInes BloomerMD  cyclobenzaprine (FLEXERIL) 10 MG tablet Take 1 tablet (10 mg total) by mouth at bedtime. Take as needed. 03/09/21  Yes Tajia Szeliga, MiInes BloomerMD  desipramine (NORPRAMIN) 50 MG tablet Take 150 mg by mouth at bedtime.    Yes [provider]  gabapentin (NEURONTIN) 300 MG capsule TAKE 2 CAPSULES(600 MG) BY MOUTH TWICE DAILY 07/01/21  Yes Zamyiah Tino, MiMarlboroMD  glucose blood  (ONETOUCH VERIO) test strip USE TO CHECK BLOOD SUGAR UP TO THREE TIMES DAILY. 07/23/19  Yes Duy Lemming, MiInes BloomerMD  hydrochlorothiazide (MICROZIDE) 12.5 MG capsule TAKE 1 CAPSULE(12.5 MG) BY MOUTH DAILY 07/01/21  Yes Zan Triska, MiInes BloomerMD  JARDIANCE 10 MG TABS tablet TAKE 1 TABLET(10 MG) BY MOUTH DAILY BEFORE BREAKFAST 08/08/20  Yes SaHorald PollenMD  lisinopril (ZESTRIL) 10 MG tablet TAKE 1 TABLET(10 MG) BY MOUTH DAILY 07/05/21  Yes SaHorald PollenMD  metFORMIN (GLUCOPHAGE-XR) 500 MG 24 hr tablet TAKE 2 TABLETS(1000 MG) BY MOUTH IN THE MORNING AND AT BEDTIME 06/05/21  Yes Dellie Piasecki, MiInes BloomerMD  metoprolol tartrate (LOPRESSOR) 100 MG tablet TAKE 1 TABLET(100 MG) BY MOUTH TWICE DAILY 06/05/21  Yes SaHalsteadMiInes BloomerMD  ONETOUCH DELICA LANCETS FINE MISC USE AS DIRECTED TO CHECK BLOOD GLUCOSE UP TO THREE TIMES DAILY 12/14/15  Yes SmWardell HonourMD  TRADJENTA 5 MG TABS tablet TAKE 1 TABLET(5 MG) BY MOUTH DAILY 06/05/21  Yes Raquell Richer, MiInes BloomerMD    Allergies  Allergen Reactions   Penicillins Rash    Has patient had a PCN reaction causing immediate rash, facial/tongue/throat swelling, SOB or lightheadedness with hypotension: Yes Has patient had a PCN reaction causing severe rash involving mucus membranes or skin necrosis: No Has patient had a PCN reaction that required hospitalization No Has patient had a PCN reaction occurring within the last 10 years:  No If all of the above answers are "NO", then may proceed with Cephalosporins. Occurred in childhood.     Patient Active Problem List   Diagnosis Date Noted   Generalized anxiety disorder 03/02/2021   Right inguinal hernia 08/14/2019   History of shingles 02/28/2017   Post herpetic neuralgia 02/28/2017   Family history of coronary artery disease in father 10/23/2013   Tobacco abuse 10/17/2013   Unstable angina ([place]) 10/04/2013   PVC's (premature ventricular contractions) 10/04/2013   Neuropathy, diabetic ([place])  01/31/2012   Hypertension associated with diabetes ([place])    Dyslipidemia associated with type 2 diabetes mellitus ([place])    Hyperlipidemia     Past Medical History:  Diagnosis Date   Anxiety    Arthritis    hands   CAD (coronary artery disease)    a. cath 10/23/13: nonobstructive CAD, nl LV function, rec risk factor modification   Chronic kidney disease    h/o kidney stone - passed stone   Diabetes mellitus    type 2   History of pneumonia    Hyperlipidemia    Hypertension    Liver mass    Sinus bradycardia    a. as low as 36 bpm; b. multiple pauses 2.1-2.3 seconds   Ulcer    Umbilical hernia     Past Surgical History:  Procedure Laterality Date   BACK SURGERY     CERVICAL SPINE SURGERY     COLONOSCOPY  2015   Dr Collene Mares 3 TAs, tics, hems   INGUINAL HERNIA REPAIR Bilateral 09/16/2019   Procedure: LAPAROSCOPIC BILATERAL INGUINAL HERNIA;  Surgeon: Johnathan Hausen, MD;  Location: [facility];  Service: General;  Laterality: Bilateral;   KNEE SURGERY Right 1970   LEFT HEART CATHETERIZATION WITH CORONARY ANGIOGRAM N/A 10/23/2013   Procedure: LEFT HEART CATHETERIZATION WITH CORONARY ANGIOGRAM;  Surgeon: Peter M Martinique, MD;  Location: [facility] LAB;  Service: Cardiovascular;  Laterality: N/A;   MICROLARYNGOSCOPY N/A 09/23/2015   Procedure: MICROLARYNGOSCOPY REMOVAL OF LESION;  Surgeon: Izora Gala, MD;  Location: [place];  Service: ENT;  Laterality: N/A;   NM [place]  08/2010   bruce myoview - normal perfusion in all regions, EF 66%, EKG negative for ischemia, no wall motion abnormalities, normal/low risk study                                        ROTATOR CUFF REPAIR Bilateral 1062   x2   UMBILICAL HERNIA REPAIR  1988    Social History   Socioeconomic History   Marital status: Married    Spouse name: Abigail Butts   Number of children: 2   Years of education: 10th    Highest education level: Not on file  Occupational History   Occupation: self-employed (Investment banker, operational)   Tobacco Use   Smoking status: Former    Packs/day: 2.50    Years: 18.00    Total pack years: 45.00    Types: Cigarettes    Quit date: 07/16/1986    Years since quitting: 35.1   Smokeless tobacco: Never  Vaping Use   Vaping Use: Never used  Substance and Sexual Activity   Alcohol use: Not Currently   Drug use: Not Currently    Types: Marijuana    Comment: Last use May 2021   Sexual activity: Yes  Other Topics Concern   Not on file  Social History Narrative  Lives at home w/ his wife   Right-handed   Daily caffeine   Social Determinants of Health   Financial Resource Strain: Not on file  Food Insecurity: Not on file  Transportation Needs: Not on file  Physical Activity: Not on file  Stress: Not on file  Social Connections: Not on file  Intimate Partner Violence: Not on file    Family History  Problem Relation Age of Onset   Heart attack Father    Hypertension Father    Heart disease Father    Diabetes Sister        HTN   Diabetes Brother        HTN, heart problems   Hypertension Other    Diabetes Paternal Grandmother    Heart disease Paternal Grandfather    Colon cancer Neg Hx    Rectal cancer Neg Hx    Stomach cancer Neg Hx      Review of Systems  Constitutional: Negative.  Negative for chills and fever.  HENT: Negative.  Negative for congestion and sore throat.   Respiratory: Negative.  Negative for cough and shortness of breath.   Cardiovascular: Negative.  Negative for chest pain and palpitations.  Gastrointestinal: Negative.  Negative for abdominal pain, diarrhea, nausea and vomiting.  Genitourinary: Negative.  Negative for dysuria and urgency.  Musculoskeletal: Negative.   Skin: Negative.  Negative for rash.  Neurological:  Negative for dizziness and headaches.       Balance issues  All other systems reviewed and are negative.  Today's Vitals   08/30/21 0759  BP: 106/62  Pulse: (!) 51  Temp: 97.8 F (36.6 C)  TempSrc: Oral  SpO2: 94%   Weight: 159 lb 4 oz (72.2 kg)  Height: '5\' 10"'$  (1.778 m)   Body mass index is 22.85 kg/m. Wt Readings from Last 3 Encounters:  08/30/21 159 lb 4 oz (72.2 kg)  03/02/21 155 lb (70.3 kg)  08/27/20 156 lb (70.8 kg)     Physical Exam Vitals reviewed.  Constitutional:      Appearance: Normal appearance.  HENT:     Head: Normocephalic.     Mouth/Throat:     Mouth: Mucous membranes are moist.     Pharynx: Oropharynx is clear.  Eyes:     Extraocular Movements: Extraocular movements intact.     Conjunctiva/sclera: Conjunctivae normal.     Pupils: Pupils are equal, round, and reactive to light.  Neck:     Vascular: No carotid bruit.  Cardiovascular:     Rate and Rhythm: Normal rate and regular rhythm.     Pulses: Normal pulses.     Heart sounds: Normal heart sounds.  Pulmonary:     Effort: Pulmonary effort is normal.     Breath sounds: Normal breath sounds.  Abdominal:     Palpations: Abdomen is soft.     Tenderness: There is no abdominal tenderness.  Musculoskeletal:        General: Normal range of motion.     Cervical back: No tenderness.     Right lower leg: No edema.     Left lower leg: No edema.  Lymphadenopathy:     Cervical: No cervical adenopathy.  Skin:    General: Skin is warm and dry.     Capillary Refill: Capillary refill takes less than 2 seconds.  Neurological:     General: No focal deficit present.     Mental Status: He is alert and oriented to person, place, and time.  Comments: Slightly positive Romberg test.  Psychiatric:        Mood and Affect: Mood normal.        Behavior: Behavior normal.    Results for orders placed or performed in visit on 08/30/21 (from the past 24 hour(s))  POCT glycosylated hemoglobin (Hb A1C)     Status: Abnormal   Collection Time: 08/30/21  8:26 AM  Result Value Ref Range   Hemoglobin A1C 6.7 (A) 4.0 - 5.6 %   HbA1c POC (<> result, manual entry)     HbA1c, POC (prediabetic range)     HbA1c, POC (controlled diabetic  range)       ASSESSMENT & PLAN: A total of 47 minutes was spent with the patient and counseling/coordination of care regarding preparing for this visit, review of most recent office visit notes, review of multiple chronic medical conditions and their management, review of all medications, cardiovascular risk associated with hypertension and diabetes, education on nutrition, differential diagnosis of balance issues and need for neurology evaluation, prognosis, documentation, review of most recent blood results including today's hemoglobin A1c, and need for follow-up.  Problem List Items Addressed This Visit       Cardiovascular and Mediastinum   Hypertension associated with diabetes ([place]) - Primary    Well-controlled hypertension. BP Readings from Last 3 Encounters:  08/30/21 106/62  03/02/21 106/62  08/27/20 110/68  Continue lisinopril 10 mg, hydrochlorothiazide 12.5 mg daily and metoprolol tartrate 100 mg twice a day. Well-controlled diabetes with hemoglobin A1c of 6.7. Continue Jardiance 10 mg, metformin 2000 mg and Tradjenta 5 mg daily. Cardiovascular risks associated with hypertension and diabetes discussed. Diet and nutrition discussed. Follow-up in 6 months.       Relevant Orders   Urine Microalbumin w/creat. ratio   Comprehensive metabolic panel   Lipid panel   POCT glycosylated hemoglobin (Hb A1C) (Completed)     Endocrine   Dyslipidemia associated with type 2 diabetes mellitus (H[facility])    Stable.  Diet and nutrition discussed. Continue atorvastatin 80 mg daily.       Neuropathy, diabetic ([place])    Stable.  Continue gabapentin 300 mg twice a day.        Other   Generalized anxiety disorder    Well-controlled and stable.  Sees psychiatrist on a regular basis who is managing his medications.      Balance problem    Differential diagnosis discussed.  Blood work done today. Clinically stable.  No red flag signs or symptoms. Needs neurology evaluation. Neurology  referral placed today.      Relevant Orders   Ambulatory referral to Neurology   TSH   Vitamin B12   VITAMIN D 25 Hydroxy (Vit-D Deficiency, Fractures)   Patient Instructions  Health Maintenance After Age 69 After age 72, you are at a higher risk for certain long-term diseases and infections as well as injuries from falls. Falls are a major cause of broken bones and head injuries in people who are older than age 64. Getting regular preventive care can help to keep you healthy and well. Preventive care includes getting regular testing and making lifestyle changes as recommended by your health care provider. Talk with your health care provider about: Which screenings and tests you should have. A screening is a test that checks for a disease when you have no symptoms. A diet and exercise plan that is right for you. What should I know about screenings and tests to prevent falls? Screening and testing are the best  ways to find a health problem early. Early diagnosis and treatment give you the best chance of managing medical conditions that are common after age 15. Certain conditions and lifestyle choices may make you more likely to have a fall. Your health care provider may recommend: Regular vision checks. Poor vision and conditions such as cataracts can make you more likely to have a fall. If you wear glasses, make sure to get your prescription updated if your vision changes. Medicine review. Work with your health care provider to regularly review all of the medicines you are taking, including over-the-counter medicines. Ask your health care provider about any side effects that may make you more likely to have a fall. Tell your health care provider if any medicines that you take make you feel dizzy or sleepy. Strength and balance checks. Your health care provider may recommend certain tests to check your strength and balance while standing, walking, or changing positions. Foot health exam. Foot pain  and numbness, as well as not wearing proper footwear, can make you more likely to have a fall. Screenings, including: Osteoporosis screening. Osteoporosis is a condition that causes the bones to get weaker and break more easily. Blood pressure screening. Blood pressure changes and medicines to control blood pressure can make you feel dizzy. Depression screening. You may be more likely to have a fall if you have a fear of falling, feel depressed, or feel unable to do activities that you used to do. Alcohol use screening. Using too much alcohol can affect your balance and may make you more likely to have a fall. Follow these instructions at home: Lifestyle Do not drink alcohol if: Your health care provider tells you not to drink. If you drink alcohol: Limit how much you have to: 0-1 drink a day for women. 0-2 drinks a day for men. Know how much alcohol is in your drink. In the [place] one drink equals one 12 oz bottle of beer (355 mL), one 5 oz glass of wine (148 mL), or one 1 oz glass of hard liquor (44 mL). Do not use any products that contain nicotine or tobacco. These products include cigarettes, chewing tobacco, and vaping devices, such as e-cigarettes. If you need help quitting, ask your health care provider. Activity  Follow a regular exercise program to stay fit. This will help you maintain your balance. Ask your health care provider what types of exercise are appropriate for you. If you need a cane or walker, use it as recommended by your health care provider. Wear supportive shoes that have nonskid soles. Safety  Remove any tripping hazards, such as rugs, cords, and clutter. Install safety equipment such as grab bars in bathrooms and safety rails on stairs. Keep rooms and walkways well-lit. General instructions Talk with your health care provider about your risks for falling. Tell your health care provider if: You fall. Be sure to tell your health care provider about all falls,  even ones that seem minor. You feel dizzy, tiredness (fatigue), or off-balance. Take over-the-counter and prescription medicines only as told by your health care provider. These include supplements. Eat a healthy diet and maintain a healthy weight. A healthy diet includes low-fat dairy products, low-fat (lean) meats, and fiber from whole grains, beans, and lots of fruits and vegetables. Stay current with your vaccines. Schedule regular health, dental, and eye exams. Summary Having a healthy lifestyle and getting preventive care can help to protect your health and wellness after age 72. Screening and testing are the  best way to find a health problem early and help you avoid having a fall. Early diagnosis and treatment give you the best chance for managing medical conditions that are more common for people who are older than age 48. Falls are a major cause of broken bones and head injuries in people who are older than age 26. Take precautions to prevent a fall at home. Work with your health care provider to learn what changes you can make to improve your health and wellness and to prevent falls. This information is not intended to replace advice given to you by your health care provider. Make sure you discuss any questions you have with your health care provider. Document Revised: 06/22/2020 Document Reviewed: 06/22/2020 Elsevier Patient Education  [place], MD [facility]

## 2021-08-30 NOTE — Assessment & Plan Note (Signed)
Stable.  Continue gabapentin 300 mg twice a day.

## 2021-08-30 NOTE — Assessment & Plan Note (Signed)
Well-controlled hypertension. BP Readings from Last 3 Encounters:  08/30/21 106/62  03/02/21 106/62  08/27/20 110/68  Continue lisinopril 10 mg, hydrochlorothiazide 12.5 mg daily and metoprolol tartrate 100 mg twice a day. Well-controlled diabetes with hemoglobin A1c of 6.7. Continue Jardiance 10 mg, metformin 2000 mg and Tradjenta 5 mg daily. Cardiovascular risks associated with hypertension and diabetes discussed. Diet and nutrition discussed. Follow-up in 6 months.

## 2021-08-31 ENCOUNTER — Encounter: Payer: Self-pay | Admitting: Neurology

## 2021-09-01 ENCOUNTER — Other Ambulatory Visit: Payer: Self-pay | Admitting: Emergency Medicine

## 2021-09-14 ENCOUNTER — Telehealth: Payer: Self-pay | Admitting: Emergency Medicine

## 2021-09-14 NOTE — Telephone Encounter (Signed)
Dr Margaret Pyle called to speak to Dr. Mitchel Honour concerning some symptoms the pt is experiencing. Would not give more specific details but would like a return call today is possible.    Dr. Casimiro Needle: [phone]

## 2021-10-03 ENCOUNTER — Other Ambulatory Visit: Payer: Self-pay | Admitting: Emergency Medicine

## 2021-10-03 DIAGNOSIS — E7800 Pure hypercholesterolemia, unspecified: Secondary | ICD-10-CM

## 2021-10-04 ENCOUNTER — Other Ambulatory Visit: Payer: Self-pay | Admitting: Emergency Medicine

## 2021-10-04 DIAGNOSIS — E119 Type 2 diabetes mellitus without complications: Secondary | ICD-10-CM

## 2021-11-26 ENCOUNTER — Telehealth (INDEPENDENT_AMBULATORY_CARE_PROVIDER_SITE_OTHER): Payer: 59 | Admitting: Nurse Practitioner

## 2021-11-26 DIAGNOSIS — J40 Bronchitis, not specified as acute or chronic: Secondary | ICD-10-CM | POA: Insufficient documentation

## 2021-11-26 MED ORDER — AZITHROMYCIN 250 MG PO TABS: ORAL_TABLET | ORAL | 0 refills | Status: DC

## 2021-11-26 MED ORDER — FLUTICASONE PROPIONATE 50 MCG/ACT NA SUSP: 2.0000 | Freq: Every day | NASAL | 6 refills | Status: AC

## 2021-11-26 MED ORDER — ALBUTEROL SULFATE HFA 108 (90 BASE) MCG/ACT IN AERS: 2.0000 | INHALATION_SPRAY | Freq: Four times a day (QID) | RESPIRATORY_TRACT | 0 refills | Status: DC | PRN

## 2021-11-26 NOTE — Assessment & Plan Note (Signed)
Acute, ongoing x 11 days. Has multiple symptoms, but reports all are mild in severity. Covid negative. Treat with Z-Pak, albuterol, flonase, mucinex -DM, hydration. Chose not to treat with steroids due to patient's history of Type II diabetes and relatively mild symptoms. Patient told to call office next week if symptoms worsen or to proceed to ER if symptoms worsen over the weekend. Patient reports understanding.

## 2021-11-26 NOTE — Progress Notes (Signed)
   Established Patient Office Visit  An audio/visual tele-health visit was completed today for this patient. I connected with  Seth Pope on 11/26/21 utilizing audio/visual technology and verified that I am speaking with the correct person using two identifiers. The patient was located at their home, and I was located at the office of [place] at [facility] during the encounter. I discussed the limitations of evaluation and management by telemedicine. The patient expressed understanding and agreed to proceed.     Subjective   Patient ID: Seth Pope, male    DOB: 1950/06/08  Age: 71 y.o. MRN: [id number]  Chief Complaint  Patient presents with   Cough    Reports symptoms started 11 days ago. Is having cough, wheezing, nasal congestion, chills, mild shortness of breath (still able to complete yard work). Former smoker, quit 25-35 years ago.  Traveled to [place] about 2 weeks ago, symptoms started towards end of that trip. Tested for covid once getting home and this was negative. Has taken ibuprofen, no other over the counter medication.     Review of Systems  Constitutional:  Positive for chills and malaise/fatigue. Negative for diaphoresis and fever.  HENT:  Positive for congestion.   Respiratory:  Positive for cough, sputum production (yellow phlegm), shortness of breath and wheezing.   Cardiovascular:  Positive for chest pain (with coughing/deep breathing).      Objective:     There were no vitals taken for this visit.   Physical Exam Comprehensive physical exam not completed today as office visit was conducted remotely.  Patient appeared well over the video, not in respiratory distress.  Patient was alert and oriented, and appeared to have appropriate judgment.   No results found for any visits on 11/26/21.    The 10-year ASCVD risk score (Arnett DK, et al., 2019) is: 19.9%    Assessment & Plan:   Problem List Items Addressed This Visit        Respiratory   Bronchitis - Primary    Acute, ongoing x 11 days. Has multiple symptoms, but reports all are mild in severity. Covid negative. Treat with Z-Pak, albuterol, flonase, mucinex -DM, hydration. Chose not to treat with steroids due to patient's history of Type II diabetes and relatively mild symptoms. Patient told to call office next week if symptoms worsen or to proceed to ER if symptoms worsen over the weekend. Patient reports understanding.       Relevant Medications   albuterol (VENTOLIN HFA) 108 (90 Base) MCG/ACT inhaler   azithromycin (ZITHROMAX) 250 MG tablet   fluticasone (FLONASE) 50 MCG/ACT nasal spray    No follow-ups on file.    Ailene Ards, NP

## 2021-11-26 NOTE — Patient Instructions (Addendum)
Use Mucinex - DM over the counter per package instructions for expectorant and cough suppressant.  Albuterol - inhaler for chest tightness or wheezing. Inhale 2 puffs into the lungs every 6 hours as needed.   Flonase - Spray 2 sprays into each nostril once a day for nasal congestion  Azithromycin - this is an antibiotic. Take 2 tablets by mouth on day 1, then 1 tablet once a day on days 2-5   If symptoms worsen (fever, cough, shortness of breath), call our office for further instruction or proceed to the emergency department if we are closed.

## 2021-11-30 ENCOUNTER — Ambulatory Visit (INDEPENDENT_AMBULATORY_CARE_PROVIDER_SITE_OTHER): Payer: 59

## 2021-11-30 ENCOUNTER — Encounter: Payer: Self-pay | Admitting: Internal Medicine

## 2021-11-30 ENCOUNTER — Ambulatory Visit: Payer: 59 | Admitting: Internal Medicine

## 2021-11-30 VITALS — BP 110/80 | HR 67 | Temp 98.5°F | Ht 70.0 in | Wt 160.4 lb

## 2021-11-30 DIAGNOSIS — J0100 Acute maxillary sinusitis, unspecified: Secondary | ICD-10-CM | POA: Diagnosis not present

## 2021-11-30 DIAGNOSIS — R052 Subacute cough: Secondary | ICD-10-CM | POA: Diagnosis not present

## 2021-11-30 DIAGNOSIS — R918 Other nonspecific abnormal finding of lung field: Secondary | ICD-10-CM | POA: Diagnosis not present

## 2021-11-30 MED ORDER — DOXYCYCLINE HYCLATE 100 MG PO TABS: 100.0000 mg | ORAL_TABLET | Freq: Two times a day (BID) | ORAL | 0 refills | Status: AC

## 2021-11-30 NOTE — Progress Notes (Signed)
Subjective:    Patient ID: Seth Pope, male    DOB: 1950-07-11, 71 y.o.   MRN: [id number]      HPI Seth Pope is here for  Chief Complaint  Patient presents with   Follow-up    Still not feeling better but not feeling worse; cough and chest pain with breathing at times.  Finished Z-pac but still not better (woke up this morning and felt weird spasms in chest that woke him up/like a pulled muscle)    She had a virtual visit with Judson Roch 10/13 for cold symptoms.  Symptoms a started 11 days prior.  Was having cough, wheeze, nasal congestion, mild shortness of breath, chills.  Had 1 negative test for COVID at home.  Diagnosed with bronchitis and prescribed with albuterol, zpak, flonase.  He finished the zpak this morning.  He is taking the mucinex.  He had some improvement with the zpak.    This morning had chest spasms on right side.  He has never had that before in the past.  He states chills, some nasal congestion, ear pain, postnasal drip, sore throat that is worse as the day progresses, chest tightness, some shortness of breath, wheezing at times and some lightheadedness that is not necessarily new.   Medications and allergies reviewed with patient and updated if appropriate.  Current Outpatient Medications on File Prior to Visit  Medication Sig Dispense Refill   albuterol (VENTOLIN HFA) 108 (90 Base) MCG/ACT inhaler Inhale 2 puffs into the lungs every 6 (six) hours as needed for wheezing or shortness of breath. 8 g 0   ALPRAZolam (XANAX XR) 1 MG 24 hr tablet Take 1 mg by mouth daily.     ALPRAZolam (XANAX) 0.25 MG tablet Take 0.25 mg by mouth 2 (two) times daily as needed for anxiety.   0   atorvastatin (LIPITOR) 80 MG tablet TAKE 1 TABLET(80 MG) BY MOUTH DAILY 90 tablet 0   azithromycin (ZITHROMAX) 250 MG tablet Take 2 tablets on day 1, then 1 tablet daily on days 2 through 5 6 tablet 0   cyclobenzaprine (FLEXERIL) 10 MG tablet Take 1 tablet (10 mg total) by mouth at bedtime. Take as  needed. 30 tablet 1   desipramine (NORPRAMIN) 50 MG tablet Take 150 mg by mouth at bedtime.      fluticasone (FLONASE) 50 MCG/ACT nasal spray Place 2 sprays into both nostrils daily. 16 g 6   gabapentin (NEURONTIN) 300 MG capsule TAKE 2 CAPSULES(600 MG) BY MOUTH TWICE DAILY 360 capsule 1   glucose blood (ONETOUCH VERIO) test strip USE TO CHECK BLOOD SUGAR UP TO THREE TIMES DAILY. 100 each 0   hydrochlorothiazide (MICROZIDE) 12.5 MG capsule TAKE 1 CAPSULE(12.5 MG) BY MOUTH DAILY 90 capsule 1   JARDIANCE 10 MG TABS tablet TAKE 1 TABLET(10 MG) BY MOUTH DAILY BEFORE AND BREAKFAST 90 tablet 3   lisinopril (ZESTRIL) 10 MG tablet TAKE 1 TABLET(10 MG) BY MOUTH DAILY 90 tablet 1   metFORMIN (GLUCOPHAGE-XR) 500 MG 24 hr tablet TAKE 2 TABLETS(1000 MG) BY MOUTH IN THE MORNING AND AT BEDTIME 360 tablet 1   metoprolol tartrate (LOPRESSOR) 100 MG tablet TAKE 1 TABLET(100 MG) BY MOUTH TWICE DAILY 180 tablet 1   ONETOUCH DELICA LANCETS FINE MISC USE AS DIRECTED TO CHECK BLOOD GLUCOSE UP TO THREE TIMES DAILY 100 each 0   TRADJENTA 5 MG TABS tablet TAKE 1 TABLET(5 MG) BY MOUTH DAILY 90 tablet 1   No current facility-administered medications on file prior  to visit.    Review of Systems  Constitutional:  Positive for chills. Negative for fever.  HENT:  Positive for congestion (mild- improved), ear pain (minimal), postnasal drip and sore throat. Negative for sinus pain.        2-3 days ago was having upper posterior pain - pain resolved  Respiratory:  Positive for cough (brought phlegm up occasionally- otherwise dry), chest tightness, shortness of breath and wheezing (with cough when he has phlegm).   Neurological:  Positive for light-headedness (if gets up fast). Negative for dizziness and headaches.       Objective:   Vitals:   11/30/21 1506  BP: 110/80  Pulse: 67  Temp: 98.5 F (36.9 C)  SpO2: 97%   BP Readings from Last 3 Encounters:  11/30/21 110/80  08/30/21 106/62  03/02/21 106/62   Wt  Readings from Last 3 Encounters:  11/30/21 160 lb 6.4 oz (72.8 kg)  08/30/21 159 lb 4 oz (72.2 kg)  03/02/21 155 lb (70.3 kg)   Body mass index is 23.02 kg/m.    Physical Exam Constitutional:      General: He is not in acute distress.    Appearance: Normal appearance. He is not ill-appearing.  HENT:     Head: Normocephalic.     Right Ear: Tympanic membrane, ear canal and external ear normal. There is no impacted cerumen.     Left Ear: Tympanic membrane, ear canal and external ear normal. There is no impacted cerumen.     Mouth/Throat:     Mouth: Mucous membranes are moist.     Pharynx: No oropharyngeal exudate or posterior oropharyngeal erythema.  Eyes:     Conjunctiva/sclera: Conjunctivae normal.  Cardiovascular:     Rate and Rhythm: Normal rate and regular rhythm.  Pulmonary:     Effort: Pulmonary effort is normal. No respiratory distress.     Breath sounds: Rales (Possible mild crackles on right side of lung) present. No wheezing.  Musculoskeletal:     Cervical back: Neck supple. No tenderness.  Lymphadenopathy:     Cervical: No cervical adenopathy.  Skin:    General: Skin is warm and dry.     Findings: No rash.  Neurological:     Mental Status: He is alert.        DG Chest 2 View CLINICAL DATA:  Cough and chest tightness  EXAM: CHEST - 2 VIEW  COMPARISON:  02/24/2017  FINDINGS: The heart size and mediastinal contours are within normal limits. Both lungs are clear. Vague nodular left lower lung opacity. The visualized skeletal structures are unremarkable.  IMPRESSION: No active cardiopulmonary disease. Vague left lower lung nodular opacity is indeterminate for nodule versus rib end artifact. Consider correlation with chest CT.  Electronically Signed   By: Donavan Foil M.D.   On: 11/30/2021 15:57      Assessment & Plan:    Subacute sinus infection, subacute cough: Seen last week for cold symptoms and was diagnosed with bronchitis-treated with  albuterol inhaler, Z-Pak and Flonase-there has been some improvement Still having significant symptoms-discussed that the still has a Z-Pak that is working, but concern with having only a partially treated infection versus slow to improve We will get chest x-Deejay today to rule out pneumonia Start doxycycline 100 mg twice daily x7 days-I think that is enough to improve his symptoms and completely treat his current infection Continue albuterol inhaler as needed, Flonase, Mucinex Call if symptoms do not improve or with any questions   Abnormal lung  x-Mihcael: X-Silviano today shows vague left lower lung nodular opacity-?  Rib artifact or nodule CT scan of chest ordered

## 2021-11-30 NOTE — Patient Instructions (Addendum)
     Have a chest xray today downstairs.      Medications changes include :   doxycyline 100 mg twice daily for 1 week     Return if symptoms worsen or fail to improve.

## 2021-12-02 ENCOUNTER — Other Ambulatory Visit: Payer: Self-pay | Admitting: Emergency Medicine

## 2021-12-02 DIAGNOSIS — E1159 Type 2 diabetes mellitus with other circulatory complications: Secondary | ICD-10-CM

## 2021-12-02 DIAGNOSIS — I152 Hypertension secondary to endocrine disorders: Secondary | ICD-10-CM

## 2021-12-02 DIAGNOSIS — E1165 Type 2 diabetes mellitus with hyperglycemia: Secondary | ICD-10-CM

## 2021-12-02 DIAGNOSIS — I10 Essential (primary) hypertension: Secondary | ICD-10-CM

## 2021-12-05 ENCOUNTER — Ambulatory Visit (HOSPITAL_BASED_OUTPATIENT_CLINIC_OR_DEPARTMENT_OTHER)
Admission: RE | Admit: 2021-12-05 | Discharge: 2021-12-05 | Disposition: A | Discharge status: Home / Self Care | Payer: 59 | Source: Ambulatory Visit | Attending: Internal Medicine | Admitting: Internal Medicine

## 2021-12-05 DIAGNOSIS — R918 Other nonspecific abnormal finding of lung field: Secondary | ICD-10-CM | POA: Insufficient documentation

## 2021-12-07 ENCOUNTER — Telehealth: Payer: Self-pay | Admitting: Emergency Medicine

## 2021-12-07 NOTE — Telephone Encounter (Signed)
Spoke with patient today and info given. 

## 2021-12-07 NOTE — Telephone Encounter (Signed)
This report will be reviewed by Dr. Quay Burow, who ordered the test, and will get back to him as soon as she can.  Thanks.

## 2021-12-07 NOTE — Telephone Encounter (Signed)
Patient is requesting call back with CT results from scan ordered by Dr. Quay Burow. Call back number is [phone].

## 2021-12-07 NOTE — Telephone Encounter (Signed)
The CT of your chest shows no lung nodule, which was one of the concerns.  You do have evidence of plaque buildup in your heart arteries and your aorta-the large artery in your chest.  Being on the atorvastatin is the treatment for this.  If you are not already I would recommend taking a baby aspirin 81 mg daily.  No other concerning findings at this time.

## 2021-12-14 NOTE — Progress Notes (Unsigned)
NEUROLOGY CONSULTATION NOTE  Seth Pope MRN: [id number] DOB: 05/07/1950  Referring provider: Horald Pollen, MD Primary care provider: Horald Pollen, MD  Reason for consult:  balance problems  Assessment/Plan:   Intermittent gait instability - unclear etiology.  Except for evidence of neuropathy in the toes, he does not exhibit any significant neurologic abnormalities on exam.  It sounds like he is describing his knee giving out, but over extension rather than flexion.  He doesn't exhibit any pain in the knees.  He does not exhibit any other signs of lumbar stenosis.  He does not exhibit signs of myelopathy.  We can check MRI of brain but since it isn't a frequent occurrence, he defers at this time.  If symptoms become worse or become less vague, he will follow up.     Subjective:  Seth Pope is a 71 year old male with CAD, DM II, HTN and HLD who presents for balance problems.  History supplemented by referring provider's note.      Over the past 6 months, she has had about 6 falls or near-falls.  While he is walking, his knee will slightly give, become hyperextended and causing him to lose his balance.  On a couple of occasions, he has fallen.  It may occur with either leg but mostly with the right leg.  No associated neck pain.  He has remote history of lumbar fusion but denies any significant back pain or radicular pain down the legs.  Denies knee pain.  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  No association with dizziness, visual disturbance or headache.  No problems with his upper extremities.  Otherwise, his gait is okay    Hgb A1c over this past year is 6.7.  TSH was 2.18.  B12 level in July was found to be 190.  He was advised to start supplement.     PAST MEDICAL HISTORY: Past Medical History:  Diagnosis Date   Anxiety    Arthritis    hands   CAD (coronary artery disease)    a. cath 10/23/13: nonobstructive  CAD, nl LV function, rec risk factor modification   Chronic kidney disease    h/o kidney stone - passed stone   Diabetes mellitus    type 2   History of pneumonia    Hyperlipidemia    Hypertension    Liver mass    Sinus bradycardia    a. as low as 36 bpm; b. multiple pauses 2.1-2.3 seconds   Ulcer    Umbilical hernia     PAST SURGICAL HISTORY: Past Surgical History:  Procedure Laterality Date   BACK SURGERY     CERVICAL SPINE SURGERY     COLONOSCOPY  2015   Dr Collene Mares 3 TAs, tics, hems   INGUINAL HERNIA REPAIR Bilateral 09/16/2019   Procedure: LAPAROSCOPIC BILATERAL INGUINAL HERNIA;  Surgeon: Johnathan Hausen, MD;  Location: [facility];  Service: General;  Laterality: Bilateral;   KNEE SURGERY Right 1970   LEFT HEART CATHETERIZATION WITH CORONARY ANGIOGRAM N/A 10/23/2013   Procedure: LEFT HEART CATHETERIZATION WITH CORONARY ANGIOGRAM;  Surgeon: Peter M Martinique, MD;  Location: [facility] C[facility];  Service: Cardiovascular;  Laterality: N/A;   MICROLARYNGOSCOPY N/A 09/23/2015   Procedure: MICROLARYNGOSCOPY REMOVAL OF LESION;  Surgeon: Izora Gala, MD;  Location: [place];  Service: ENT;  Laterality: N/A;   NM MYOCAR PERF WALL MOTION  08/2010   bruce myoview - normal perfusion in all  regions, EF 66%, EKG negative for ischemia, no wall motion abnormalities, normal/low risk study                                        ROTATOR CUFF REPAIR Bilateral 3086   x2   UMBILICAL HERNIA REPAIR  1988    MEDICATIONS: Current Outpatient Medications on File Prior to Visit  Medication Sig Dispense Refill   albuterol (VENTOLIN HFA) 108 (90 Base) MCG/ACT inhaler Inhale 2 puffs into the lungs every 6 (six) hours as needed for wheezing or shortness of breath. 8 g 0   ALPRAZolam (XANAX XR) 1 MG 24 hr tablet Take 1 mg by mouth daily.     ALPRAZolam (XANAX) 0.25 MG tablet Take 0.25 mg by mouth 2 (two) times daily as needed for anxiety.   0   atorvastatin (LIPITOR) 80 MG tablet TAKE 1 TABLET(80 MG) BY MOUTH DAILY 90 tablet 0    cyclobenzaprine (FLEXERIL) 10 MG tablet Take 1 tablet (10 mg total) by mouth at bedtime. Take as needed. 30 tablet 1   desipramine (NORPRAMIN) 50 MG tablet Take 150 mg by mouth at bedtime.      fluticasone (FLONASE) 50 MCG/ACT nasal spray Place 2 sprays into both nostrils daily. 16 g 6   gabapentin (NEURONTIN) 300 MG capsule TAKE 2 CAPSULES(600 MG) BY MOUTH TWICE DAILY 360 capsule 1   glucose blood (ONETOUCH VERIO) test strip USE TO CHECK BLOOD SUGAR UP TO THREE TIMES DAILY. 100 each 0   hydrochlorothiazide (MICROZIDE) 12.5 MG capsule TAKE 1 CAPSULE(12.5 MG) BY MOUTH DAILY 90 capsule 1   JARDIANCE 10 MG TABS tablet TAKE 1 TABLET(10 MG) BY MOUTH DAILY BEFORE AND BREAKFAST 90 tablet 3   lisinopril (ZESTRIL) 10 MG tablet TAKE 1 TABLET(10 MG) BY MOUTH DAILY 90 tablet 1   metFORMIN (GLUCOPHAGE-XR) 500 MG 24 hr tablet TAKE 2 TABLETS(1000 MG) BY MOUTH IN THE MORNING AND AT BEDTIME 360 tablet 1   metoprolol tartrate (LOPRESSOR) 100 MG tablet TAKE 1 TABLET(100 MG) BY MOUTH TWICE DAILY 180 tablet 1   ONETOUCH DELICA LANCETS FINE MISC USE AS DIRECTED TO CHECK BLOOD GLUCOSE UP TO THREE TIMES DAILY 100 each 0   TRADJENTA 5 MG TABS tablet TAKE 1 TABLET(5 MG) BY MOUTH DAILY 90 tablet 1   No current facility-administered medications on file prior to visit.    ALLERGIES: Allergies  Allergen Reactions   Penicillins Rash    Has patient had a PCN reaction causing immediate rash, facial/tongue/throat swelling, SOB or lightheadedness with hypotension: Yes Has patient had a PCN reaction causing severe rash involving mucus membranes or skin necrosis: No Has patient had a PCN reaction that required hospitalization No Has patient had a PCN reaction occurring within the last 10 years: No If all of the above answers are "NO", then may proceed with Cephalosporins. Occurred in childhood.     FAMILY HISTORY: Family History  Problem Relation Age of Onset   Heart attack Father    Hypertension Father    Heart  disease Father    Diabetes Sister        HTN   Diabetes Brother        HTN, heart problems   Hypertension Other    Diabetes Paternal Grandmother    Heart disease Paternal Grandfather    Colon cancer Neg Hx    Rectal cancer Neg Hx    Stomach cancer  Neg Hx     Objective:  Blood pressure 100/64, pulse (!) 59, height '5\' 10"'$  (1.778 m), weight 162 lb 6.4 oz (73.7 kg), SpO2 96 %. General: No acute distress.  Patient appears well-groomed.   Head:  Normocephalic/atraumatic Eyes:  fundi examined but not visualized Neck: supple, no paraspinal tenderness, full range of motion Back: No paraspinal tenderness Heart: regular rate and rhythm Lungs: Clear to auscultation bilaterally. Vascular: No carotid bruits. Neurological Exam: Mental status: alert and oriented to person, place, and time, speech fluent and not dysarthric, language intact. Cranial nerves: CN I: not tested CN II: pupils equal, round and reactive to light, visual fields intact CN III, IV, VI:  full range of motion, no nystagmus, no ptosis CN V: facial sensation intact. CN VII: upper and lower face symmetric CN VIII: hearing intact CN IX, X: gag intact, uvula midline CN XI: sternocleidomastoid and trapezius muscles intact CN XII: tongue midline Bulk & Tone: normal, no fasciculations. Motor:  muscle strength 5/5 throughout Sensation:  Pinprick, temperature and vibratory sensation intact. Deep Tendon Reflexes:  2+ throughout,  toes downgoing.   Finger to nose testing:  Without dysmetria.   Heel to shin:  Without dysmetria.   Gait:  Normal station and stride.  Romberg negative.    Thank you for allowing me to take part in the care of this patient.  Metta Clines, DO  CC: Horald Pollen, MD

## 2021-12-15 ENCOUNTER — Encounter: Payer: Self-pay | Admitting: Neurology

## 2021-12-15 ENCOUNTER — Ambulatory Visit (INDEPENDENT_AMBULATORY_CARE_PROVIDER_SITE_OTHER): Payer: 59 | Admitting: Neurology

## 2021-12-15 VITALS — BP 100/64 | HR 59 | Ht 70.0 in | Wt 162.4 lb

## 2021-12-15 DIAGNOSIS — R2681 Unsteadiness on feet: Secondary | ICD-10-CM

## 2022-01-01 ENCOUNTER — Other Ambulatory Visit: Payer: Self-pay | Admitting: Emergency Medicine

## 2022-01-01 DIAGNOSIS — E7800 Pure hypercholesterolemia, unspecified: Secondary | ICD-10-CM

## 2022-01-02 ENCOUNTER — Other Ambulatory Visit: Payer: Self-pay | Admitting: Emergency Medicine

## 2022-01-02 DIAGNOSIS — I10 Essential (primary) hypertension: Secondary | ICD-10-CM

## 2022-01-17 ENCOUNTER — Ambulatory Visit: Payer: 59 | Admitting: Neurology

## 2022-01-27 ENCOUNTER — Other Ambulatory Visit: Payer: Self-pay | Admitting: Nurse Practitioner

## 2022-01-27 DIAGNOSIS — J40 Bronchitis, not specified as acute or chronic: Secondary | ICD-10-CM

## 2022-03-02 ENCOUNTER — Ambulatory Visit: Payer: Managed Care, Other (non HMO) | Admitting: Emergency Medicine

## 2022-03-02 ENCOUNTER — Encounter: Payer: Self-pay | Admitting: Emergency Medicine

## 2022-03-02 ENCOUNTER — Other Ambulatory Visit: Payer: Self-pay | Admitting: Emergency Medicine

## 2022-03-02 VITALS — BP 120/76 | HR 57 | Temp 97.8°F | Ht 70.0 in | Wt 166.2 lb

## 2022-03-02 DIAGNOSIS — E1169 Type 2 diabetes mellitus with other specified complication: Secondary | ICD-10-CM | POA: Diagnosis not present

## 2022-03-02 DIAGNOSIS — E785 Hyperlipidemia, unspecified: Secondary | ICD-10-CM

## 2022-03-02 DIAGNOSIS — E1165 Type 2 diabetes mellitus with hyperglycemia: Secondary | ICD-10-CM

## 2022-03-02 DIAGNOSIS — I152 Hypertension secondary to endocrine disorders: Secondary | ICD-10-CM

## 2022-03-02 DIAGNOSIS — F411 Generalized anxiety disorder: Secondary | ICD-10-CM | POA: Diagnosis not present

## 2022-03-02 DIAGNOSIS — E1159 Type 2 diabetes mellitus with other circulatory complications: Secondary | ICD-10-CM | POA: Diagnosis not present

## 2022-03-02 DIAGNOSIS — E1142 Type 2 diabetes mellitus with diabetic polyneuropathy: Secondary | ICD-10-CM

## 2022-03-02 LAB — POCT GLYCOSYLATED HEMOGLOBIN (HGB A1C): Hemoglobin A1C: 6.8 % — AB (ref 4.0–5.6)

## 2022-03-02 NOTE — Assessment & Plan Note (Signed)
Stable.  Continues gabapentin 300 mg twice a day

## 2022-03-02 NOTE — Assessment & Plan Note (Signed)
Well-controlled hypertension. Continue lisinopril 10 mg, hydrochlorothiazide 12.5 mg and metoprolol tartrate 100 mg twice a day Well-controlled diabetes with hemoglobin A1c of 6.8. Continue metformin 1000 mg twice a day, Jardiance 10 mg daily, and Tradjenta 5 mg daily. Cardiovascular risks associated with hypertension and diabetes discussed. Diet and nutrition discussed. Follow-up in 6 months.

## 2022-03-02 NOTE — Assessment & Plan Note (Signed)
Stable.  Diet and nutrition discussed. Cardiovascular risk associated with dyslipidemia discussed. Continue atorvastatin 80 mg daily. The 10-year ASCVD risk score (Arnett DK, et al., 2019) is: 24.2%   Values used to calculate the score:     Age: 72 years     Sex: Male     Is Non-Hispanic African American: No     Diabetic: Yes     Tobacco smoker: No     Systolic Blood Pressure: 379 mmHg     Is BP treated: Yes     HDL Cholesterol: 55.3 mg/dL     Total Cholesterol: 130 mg/dL

## 2022-03-02 NOTE — Patient Instructions (Signed)
Health Maintenance After Age 70 After age 70, you are at a higher risk for certain long-term diseases and infections as well as injuries from falls. Falls are a major cause of broken bones and head injuries in people who are older than age 70. Getting regular preventive care can help to keep you healthy and well. Preventive care includes getting regular testing and making lifestyle changes as recommended by your health care provider. Talk with your health care provider about: Which screenings and tests you should have. A screening is a test that checks for a disease when you have no symptoms. A diet and exercise plan that is right for you. What should I know about screenings and tests to prevent falls? Screening and testing are the best ways to find a health problem early. Early diagnosis and treatment give you the best chance of managing medical conditions that are common after age 70. Certain conditions and lifestyle choices may make you more likely to have a fall. Your health care provider may recommend: Regular vision checks. Poor vision and conditions such as cataracts can make you more likely to have a fall. If you wear glasses, make sure to get your prescription updated if your vision changes. Medicine review. Work with your health care provider to regularly review all of the medicines you are taking, including over-the-counter medicines. Ask your health care provider about any side effects that may make you more likely to have a fall. Tell your health care provider if any medicines that you take make you feel dizzy or sleepy. Strength and balance checks. Your health care provider may recommend certain tests to check your strength and balance while standing, walking, or changing positions. Foot health exam. Foot pain and numbness, as well as not wearing proper footwear, can make you more likely to have a fall. Screenings, including: Osteoporosis screening. Osteoporosis is a condition that causes  the bones to get weaker and break more easily. Blood pressure screening. Blood pressure changes and medicines to control blood pressure can make you feel dizzy. Depression screening. You may be more likely to have a fall if you have a fear of falling, feel depressed, or feel unable to do activities that you used to do. Alcohol use screening. Using too much alcohol can affect your balance and may make you more likely to have a fall. Follow these instructions at home: Lifestyle Do not drink alcohol if: Your health care provider tells you not to drink. If you drink alcohol: Limit how much you have to: 0-1 drink a day for women. 0-2 drinks a day for men. Know how much alcohol is in your drink. In the [place] one drink equals one 12 oz bottle of beer (355 mL), one 5 oz glass of wine (148 mL), or one 1 oz glass of hard liquor (44 mL). Do not use any products that contain nicotine or tobacco. These products include cigarettes, chewing tobacco, and vaping devices, such as e-cigarettes. If you need help quitting, ask your health care provider. Activity  Follow a regular exercise program to stay fit. This will help you maintain your balance. Ask your health care provider what types of exercise are appropriate for you. If you need a cane or walker, use it as recommended by your health care provider. Wear supportive shoes that have nonskid soles. Safety  Remove any tripping hazards, such as rugs, cords, and clutter. Install safety equipment such as grab bars in bathrooms and safety rails on stairs. Keep rooms and walkways   well-lit. General instructions Talk with your health care provider about your risks for falling. Tell your health care provider if: You fall. Be sure to tell your health care provider about all falls, even ones that seem minor. You feel dizzy, tiredness (fatigue), or off-balance. Take over-the-counter and prescription medicines only as told by your health care provider. These include  supplements. Eat a healthy diet and maintain a healthy weight. A healthy diet includes low-fat dairy products, low-fat (lean) meats, and fiber from whole grains, beans, and lots of fruits and vegetables. Stay current with your vaccines. Schedule regular health, dental, and eye exams. Summary Having a healthy lifestyle and getting preventive care can help to protect your health and wellness after age 70. Screening and testing are the best way to find a health problem early and help you avoid having a fall. Early diagnosis and treatment give you the best chance for managing medical conditions that are more common for people who are older than age 70. Falls are a major cause of broken bones and head injuries in people who are older than age 70. Take precautions to prevent a fall at home. Work with your health care provider to learn what changes you can make to improve your health and wellness and to prevent falls. This information is not intended to replace advice given to you by your health care provider. Make sure you discuss any questions you have with your health care provider. Document Revised: 06/22/2020 Document Reviewed: 06/22/2020 Elsevier Patient Education  2023 [employer]  

## 2022-03-02 NOTE — Assessment & Plan Note (Signed)
Stable and well-controlled.  Follows up with psychiatry on a regular basis.  Their office is handling his anxiety medications.

## 2022-03-02 NOTE — Progress Notes (Signed)
Lab Results  Component Value Date   HGBA1C 6.7 (A) 08/30/2021   BP Readings from Last 3 Encounters:  12/15/21 100/64  11/30/21 110/80  08/30/21 106/62   Last neurology evaluation for gait instability last November, assessment and plan as follows: Assessment/Plan:    Intermittent gait instability - unclear etiology.  Except for evidence of neuropathy in the toes, he does not exhibit any significant neurologic abnormalities on exam.  It sounds like he is describing his knee giving out, but over extension rather than flexion.  He doesn't exhibit any pain in the knees.  He does not exhibit any other signs of lumbar stenosis.  He does not exhibit signs of myelopathy.  We can check MRI of brain but since it isn't a frequent occurrence, he defers at this time.  If symptoms become worse or become less vague, he will follow up.     Seth Pope 72 y.o.   Chief Complaint  Patient presents with   Follow-up    56mth f/u appt, no concerns    HISTORY OF PRESENT ILLNESS: This is a 72y.o. male here for 648-monthollow-up of diabetes, hypertension, and dyslipidemia. Last office visit we discussed differential diagnosis of lack of balance. Was referred to and evaluated by neurologist.  No significant findings. Doing well.  Has no complaints or medical concerns today.  HPI   Prior to Admission medications   Medication Sig Start Date End Date Taking? Authorizing Provider  ALPRAZolam (XANAX XR) 1 MG 24 hr tablet Take 1 mg by mouth daily.   Yes [provider]  ALPRAZolam (XANAX) 0.25 MG tablet Take 0.25 mg by mouth 2 (two) times daily as needed for anxiety.  03/16/16  Yes [provider]  atorvastatin (LIPITOR) 80 MG tablet TAKE 1 TABLET(80 MG) BY MOUTH DAILY 01/01/22  Yes May Ozment, MiInes BloomerMD  cyclobenzaprine (FLEXERIL) 10 MG tablet Take 1 tablet (10 mg total) by mouth at bedtime. Take as needed. 03/09/21  Yes Kaeo Jacome, MiInes BloomerMD  desipramine (NORPRAMIN) 50 MG tablet Take  150 mg by mouth at bedtime.    Yes [provider]  fluticasone (FLONASE) 50 MCG/ACT nasal spray Place 2 sprays into both nostrils daily. 11/26/21  Yes GrAilene ArdsNP  gabapentin (NEURONTIN) 300 MG capsule TAKE 2 CAPSULES(600 MG) BY MOUTH TWICE DAILY 10/04/21  Yes Hula Tasso, MiLionvilleMD  glucose blood (ONETOUCH VERIO) test strip USE TO CHECK BLOOD SUGAR UP TO THREE TIMES DAILY. 07/23/19  Yes Ersel Wadleigh, MiInes BloomerMD  hydrochlorothiazide (MICROZIDE) 12.5 MG capsule TAKE 1 CAPSULE(12.5 MG) BY MOUTH DAILY 01/02/22  Yes Siddalee Vanderheiden, MiInes BloomerMD  JARDIANCE 10 MG TABS tablet TAKE 1 TABLET(10 MG) BY MOUTH DAILY BEFORE AND BREAKFAST 09/01/21  Yes Laiden Milles, MiInes BloomerMD  lisinopril (ZESTRIL) 10 MG tablet TAKE 1 TABLET(10 MG) BY MOUTH DAILY 01/01/22  Yes Merian Wroe, MiInes BloomerMD  metFORMIN (GLUCOPHAGE-XR) 500 MG 24 hr tablet TAKE 2 TABLETS(1000 MG) BY MOUTH IN THE MORNING AND AT BEDTIME 03/02/22  Yes Talha Iser, MiInes BloomerMD  metoprolol tartrate (LOPRESSOR) 100 MG tablet TAKE 1 TABLET(100 MG) BY MOUTH TWICE DAILY 12/02/21  Yes SaSkokieMiInes BloomerMD  ONETOUCH DELICA LANCETS FINE MISC USE AS DIRECTED TO CHECK BLOOD GLUCOSE UP TO THREE TIMES DAILY 12/14/15  Yes SmWardell HonourMD  TRADJENTA 5 MG TABS tablet TAKE 1 TABLET(5 MG) BY MOUTH DAILY 12/02/21  Yes SaHorald PollenMD    Allergies  Allergen Reactions   Penicillins Rash  Has patient had a PCN reaction causing immediate rash, facial/tongue/throat swelling, SOB or lightheadedness with hypotension: Yes Has patient had a PCN reaction causing severe rash involving mucus membranes or skin necrosis: No Has patient had a PCN reaction that required hospitalization No Has patient had a PCN reaction occurring within the last 10 years: No If all of the above answers are "NO", then may proceed with Cephalosporins. Occurred in childhood.     Patient Active Problem List   Diagnosis Date Noted   Generalized anxiety disorder  03/02/2021   Family history of coronary artery disease in father 10/23/2013   Tobacco abuse 10/17/2013   Neuropathy, diabetic ([place]) 01/31/2012   Hypertension associated with diabetes ([place])    Dyslipidemia associated with type 2 diabetes mellitus ([place])    Hyperlipidemia     Past Medical History:  Diagnosis Date   Anxiety    Arthritis    hands   CAD (coronary artery disease)    a. cath 10/23/13: nonobstructive CAD, nl LV function, rec risk factor modification   Chronic kidney disease    h/o kidney stone - passed stone   Diabetes mellitus    type 2   History of pneumonia    Hyperlipidemia    Hypertension    Liver mass    Sinus bradycardia    a. as low as 36 bpm; b. multiple pauses 2.1-2.3 seconds   Ulcer    Umbilical hernia     Past Surgical History:  Procedure Laterality Date   BACK SURGERY     CERVICAL SPINE SURGERY     COLONOSCOPY  2015   Dr Collene Mares 3 TAs, tics, hems   INGUINAL HERNIA REPAIR Bilateral 09/16/2019   Procedure: LAPAROSCOPIC BILATERAL INGUINAL HERNIA;  Surgeon: Johnathan Hausen, MD;  Location: [facility];  Service: General;  Laterality: Bilateral;   KNEE SURGERY Right 1970   LEFT HEART CATHETERIZATION WITH CORONARY ANGIOGRAM N/A 10/23/2013   Procedure: LEFT HEART CATHETERIZATION WITH CORONARY ANGIOGRAM;  Surgeon: Peter M Martinique, MD;  Location: [facility] CATH LAB;  Service: Cardiovascular;  Laterality: N/A;   MICROLARYNGOSCOPY N/A 09/23/2015   Procedure: MICROLARYNGOSCOPY REMOVAL OF LESION;  Surgeon: Izora Gala, MD;  Location: [facility];  Service: ENT;  Laterality: N/A;   NM [place]  08/2010   bruce myoview - normal perfusion in all regions, EF 66%, EKG negative for ischemia, no wall motion abnormalities, normal/low risk study                                        ROTATOR CUFF REPAIR Bilateral 6301   x2   UMBILICAL HERNIA REPAIR  1988    Social History   Socioeconomic History   Marital status: Married    Spouse name: Abigail Butts   Number of children: 2   Years of  education: 10th    Highest education level: Not on file  Occupational History   Occupation: self-employed (Investment banker, operational)  Tobacco Use   Smoking status: Former    Packs/day: 2.50    Years: 18.00    Total pack years: 45.00    Types: Cigarettes    Quit date: 07/16/1986    Years since quitting: 35.6   Smokeless tobacco: Never  Vaping Use   Vaping Use: Never used  Substance and Sexual Activity   Alcohol use: Not Currently   Drug use: Not Currently    Types: Marijuana  Comment: Last use May 2021   Sexual activity: Yes  Other Topics Concern   Not on file  Social History Narrative   Lives at home w/ his wife   Right-handed   Daily caffeine   Social Determinants of Health   Financial Resource Strain: Not on file  Food Insecurity: Not on file  Transportation Needs: Not on file  Physical Activity: Not on file  Stress: Not on file  Social Connections: Not on file  Intimate Partner Violence: Not on file    Family History  Problem Relation Age of Onset   Heart attack Father    Hypertension Father    Heart disease Father    Diabetes Sister        HTN   Diabetes Brother        HTN, heart problems   Hypertension Other    Diabetes Paternal Grandmother    Heart disease Paternal Grandfather    Colon cancer Neg Hx    Rectal cancer Neg Hx    Stomach cancer Neg Hx      Review of Systems  Constitutional: Negative.  Negative for chills and fever.  HENT: Negative.  Negative for congestion and sore throat.   Respiratory: Negative.  Negative for cough and shortness of breath.   Cardiovascular: Negative.  Negative for chest pain and palpitations.  Gastrointestinal:  Negative for abdominal pain, nausea and vomiting.  Genitourinary: Negative.   Skin: Negative.  Negative for rash.  Neurological: Negative.  Negative for dizziness and headaches.  All other systems reviewed and are negative.  Today's Vitals   03/02/22 0819  Weight: 166 lb 4 oz (75.4 kg)  Height: '5\' 10"'$   (1.778 m)   Body mass index is 23.85 kg/m.   Physical Exam Vitals reviewed.  Constitutional:      Appearance: Normal appearance.  HENT:     Head: Normocephalic.  Eyes:     Extraocular Movements: Extraocular movements intact.     Pupils: Pupils are equal, round, and reactive to light.  Cardiovascular:     Rate and Rhythm: Normal rate and regular rhythm.     Pulses: Normal pulses.     Heart sounds: Normal heart sounds.  Pulmonary:     Effort: Pulmonary effort is normal.     Breath sounds: Normal breath sounds.  Musculoskeletal:     Cervical back: No tenderness.  Lymphadenopathy:     Cervical: No cervical adenopathy.  Skin:    General: Skin is warm and dry.  Neurological:     General: No focal deficit present.     Mental Status: He is alert and oriented to person, place, and time.  Psychiatric:        Mood and Affect: Mood normal.        Behavior: Behavior normal.    Results for orders placed or performed in visit on 03/02/22 (from the past 24 hour(s))  POCT HgB A1C     Status: Abnormal   Collection Time: 03/02/22  9:07 AM  Result Value Ref Range   Hemoglobin A1C 6.8 (A) 4.0 - 5.6 %   HbA1c POC (<> result, manual entry)     HbA1c, POC (prediabetic range)     HbA1c, POC (controlled diabetic range)       ASSESSMENT & PLAN: A total of 45 minutes was spent with the patient and counseling/coordination of care regarding preparing for this visit, review of most recent office visit notes, review of most recent blood work results including interpretation of today's  hemoglobin A1c, review of multiple chronic medical conditions under management, review of all medications, cardiovascular risk associated with hypertension diabetes and dyslipidemia, education on nutrition, prognosis, documentation, and need for follow-up.  Problem List Items Addressed This Visit       Cardiovascular and Mediastinum   Hypertension associated with diabetes ([place]) - Primary    Well-controlled  hypertension. Continue lisinopril 10 mg, hydrochlorothiazide 12.5 mg and metoprolol tartrate 100 mg twice a day Well-controlled diabetes with hemoglobin A1c of 6.8. Continue metformin 1000 mg twice a day, Jardiance 10 mg daily, and Tradjenta 5 mg daily. Cardiovascular risks associated with hypertension and diabetes discussed. Diet and nutrition discussed. Follow-up in 6 months.      Relevant Orders   POCT HgB A1C     Endocrine   Dyslipidemia associated with type 2 diabetes mellitus ([place])    Stable.  Diet and nutrition discussed. Cardiovascular risk associated with dyslipidemia discussed. Continue atorvastatin 80 mg daily. The 10-year ASCVD risk score (Arnett DK, et al., 2019) is: 24.2%   Values used to calculate the score:     Age: 9 years     Sex: Male     Is Non-Hispanic African American: No     Diabetic: Yes     Tobacco smoker: No     Systolic Blood Pressure: 294 mmHg     Is BP treated: Yes     HDL Cholesterol: 55.3 mg/dL     Total Cholesterol: 130 mg/dL       Neuropathy, diabetic (HCC)    Stable.  Continues gabapentin 300 mg twice a day        Other   Generalized anxiety disorder    Stable and well-controlled.  Follows up with psychiatry on a regular basis.  Their office is handling his anxiety medications.      Patient Instructions  Health Maintenance After Age 50 After age 49, you are at a higher risk for certain long-term diseases and infections as well as injuries from falls. Falls are a major cause of broken bones and head injuries in people who are older than age 79. Getting regular preventive care can help to keep you healthy and well. Preventive care includes getting regular testing and making lifestyle changes as recommended by your health care provider. Talk with your health care provider about: Which screenings and tests you should have. A screening is a test that checks for a disease when you have no symptoms. A diet and exercise plan that is right for  you. What should I know about screenings and tests to prevent falls? Screening and testing are the best ways to find a health problem early. Early diagnosis and treatment give you the best chance of managing medical conditions that are common after age 79. Certain conditions and lifestyle choices may make you more likely to have a fall. Your health care provider may recommend: Regular vision checks. Poor vision and conditions such as cataracts can make you more likely to have a fall. If you wear glasses, make sure to get your prescription updated if your vision changes. Medicine review. Work with your health care provider to regularly review all of the medicines you are taking, including over-the-counter medicines. Ask your health care provider about any side effects that may make you more likely to have a fall. Tell your health care provider if any medicines that you take make you feel dizzy or sleepy. Strength and balance checks. Your health care provider may recommend certain tests to check your strength  and balance while standing, walking, or changing positions. Foot health exam. Foot pain and numbness, as well as not wearing proper footwear, can make you more likely to have a fall. Screenings, including: Osteoporosis screening. Osteoporosis is a condition that causes the bones to get weaker and break more easily. Blood pressure screening. Blood pressure changes and medicines to control blood pressure can make you feel dizzy. Depression screening. You may be more likely to have a fall if you have a fear of falling, feel depressed, or feel unable to do activities that you used to do. Alcohol use screening. Using too much alcohol can affect your balance and may make you more likely to have a fall. Follow these instructions at home: Lifestyle Do not drink alcohol if: Your health care provider tells you not to drink. If you drink alcohol: Limit how much you have to: 0-1 drink a day for women. 0-2  drinks a day for men. Know how much alcohol is in your drink. In the [place] one drink equals one 12 oz bottle of beer (355 mL), one 5 oz glass of wine (148 mL), or one 1 oz glass of hard liquor (44 mL). Do not use any products that contain nicotine or tobacco. These products include cigarettes, chewing tobacco, and vaping devices, such as e-cigarettes. If you need help quitting, ask your health care provider. Activity  Follow a regular exercise program to stay fit. This will help you maintain your balance. Ask your health care provider what types of exercise are appropriate for you. If you need a cane or walker, use it as recommended by your health care provider. Wear supportive shoes that have nonskid soles. Safety  Remove any tripping hazards, such as rugs, cords, and clutter. Install safety equipment such as grab bars in bathrooms and safety rails on stairs. Keep rooms and walkways well-lit. General instructions Talk with your health care provider about your risks for falling. Tell your health care provider if: You fall. Be sure to tell your health care provider about all falls, even ones that seem minor. You feel dizzy, tiredness (fatigue), or off-balance. Take over-the-counter and prescription medicines only as told by your health care provider. These include supplements. Eat a healthy diet and maintain a healthy weight. A healthy diet includes low-fat dairy products, low-fat (lean) meats, and fiber from whole grains, beans, and lots of fruits and vegetables. Stay current with your vaccines. Schedule regular health, dental, and eye exams. Summary Having a healthy lifestyle and getting preventive care can help to protect your health and wellness after age 34. Screening and testing are the best way to find a health problem early and help you avoid having a fall. Early diagnosis and treatment give you the best chance for managing medical conditions that are more common for people who are  older than age 82. Falls are a major cause of broken bones and head injuries in people who are older than age 49. Take precautions to prevent a fall at home. Work with your health care provider to learn what changes you can make to improve your health and wellness and to prevent falls. This information is not intended to replace advice given to you by your health care provider. Make sure you discuss any questions you have with your health care provider. Document Revised: 06/22/2020 Document Reviewed: 06/22/2020 Elsevier Patient Education  [place], MD [facility]

## 2022-03-21 ENCOUNTER — Telehealth: Payer: Self-pay | Admitting: Emergency Medicine

## 2022-03-21 ENCOUNTER — Other Ambulatory Visit (HOSPITAL_COMMUNITY): Payer: Self-pay

## 2022-03-21 NOTE — Telephone Encounter (Signed)
Pt called in requesting for TRADJENTA 5 MG TABS tablet to be refill.

## 2022-03-23 ENCOUNTER — Other Ambulatory Visit (HOSPITAL_COMMUNITY): Payer: Self-pay

## 2022-03-23 NOTE — Telephone Encounter (Signed)
Patient Advocate Encounter   Received notification from [place] that prior authorization for Seth Pope is required.   PA submitted on 03/23/2022 Key [id number] Status is pending

## 2022-03-25 NOTE — Telephone Encounter (Signed)
PT and PT's spouse call back in regards to this. With the wait time on this medication they have already had to buy 3 tablets of this medication out of pocket. They are alright with continuing to buy this medication out of pocket if Dr.Sagardia deems it necessary.   I did inform them of pending status for PA.  CB: [phone]

## 2022-03-28 NOTE — Telephone Encounter (Signed)
Pt has called and stated he still has not heard anything about the PA. I was able to inform him that the PA has been sent in and we are waiting to see if the PA has been approved or denied.  **Pt states since he has not been taking the medication his blood sugars has ben going back up to high levels. Pt states is there any suggestions on what he should to do until we get a decision about PA. Pt asked should he take an extra dose of the Jardiance or his other diabetic medications he is on?  Please give the pt a call with the update to what he should.  **Last Blood Glucose check was 200 on 03/27/2022 after eating dinner, Fasting morning blood glucose check was 170 today 03/28/2022.

## 2022-03-28 NOTE — Telephone Encounter (Signed)
Called patient and informed him of provider recommendation

## 2022-03-28 NOTE — Telephone Encounter (Signed)
Pt spoke with [employer] and they told him he has 3 more days until his determination left over due to PA not marked URGENT. Pa was sent in as a regular PA so those PA's take up to 8 days.

## 2022-03-28 NOTE — Telephone Encounter (Signed)
Recommend to take extra dose of Jardiance daily.  That would be 20 mg daily of Jardiance.  Thanks.

## 2022-03-30 NOTE — Telephone Encounter (Signed)
PA for Tradjenta denied, appeal started , left message for patient to make him aware

## 2022-04-01 ENCOUNTER — Other Ambulatory Visit: Payer: Self-pay | Admitting: Emergency Medicine

## 2022-04-01 DIAGNOSIS — E7800 Pure hypercholesterolemia, unspecified: Secondary | ICD-10-CM

## 2022-04-05 ENCOUNTER — Telehealth: Payer: Self-pay | Admitting: Emergency Medicine

## 2022-04-05 MED ORDER — ONETOUCH ULTRASOFT LANCETS MISC: 12 refills | Status: AC

## 2022-04-05 MED ORDER — ONETOUCH VERIO VI STRP: ORAL_STRIP | 0 refills | Status: DC

## 2022-04-05 NOTE — Telephone Encounter (Signed)
Patient needs his test strips and lancets sent in to [facility] on [address]  Patient's number:  [phone]

## 2022-04-05 NOTE — Telephone Encounter (Signed)
Appeal re faxed as incorrect fax # was provided for the appeal. Will notify patient that the appeal has been re faxed.  Left message on patient VM to inform him of this matter.

## 2022-04-05 NOTE — Telephone Encounter (Signed)
Called patient and informed him that his prescriptions was sent to his request pharmacy

## 2022-04-06 ENCOUNTER — Encounter: Payer: Self-pay | Admitting: Family Medicine

## 2022-04-06 ENCOUNTER — Ambulatory Visit: Payer: Managed Care, Other (non HMO) | Admitting: Family Medicine

## 2022-04-06 VITALS — BP 136/80 | HR 55 | Temp 97.9°F | Ht 70.0 in | Wt 165.0 lb

## 2022-04-06 DIAGNOSIS — E1165 Type 2 diabetes mellitus with hyperglycemia: Secondary | ICD-10-CM

## 2022-04-06 DIAGNOSIS — I152 Hypertension secondary to endocrine disorders: Secondary | ICD-10-CM

## 2022-04-06 DIAGNOSIS — E1159 Type 2 diabetes mellitus with other circulatory complications: Secondary | ICD-10-CM | POA: Diagnosis not present

## 2022-04-06 DIAGNOSIS — R079 Chest pain, unspecified: Secondary | ICD-10-CM | POA: Diagnosis not present

## 2022-04-06 NOTE — Telephone Encounter (Signed)
Seth Pope with [employer] called stated that the appeal was denied again for TRADJENTA 5 MG TABS tablet.   Call back number [phone]

## 2022-04-06 NOTE — Progress Notes (Signed)
Established Patient Office Visit  Subjective   Patient ID: Seth Pope, male    DOB: December 03, 1950  Age: 72 y.o. MRN: [id number]  Chief Complaint  Patient presents with   Chest Pain    Patient complains of chest pain, x1 day    HPI   Seth Pope has history of hypertension, hyperlipidemia, type 2 diabetes who is seen today as a work in for the following concerns  He states that his diabetes is usually stable with most recent A1c 6.8% but recently was unable to get his Tradjenta refilled.  Prior authorization is in process.  He brings in a log of sugars today and has had several readings consistently over 200 and several even over 300.  He had some dry mouth symptoms and increased malaise.  He is currently on Jardiance and metformin.  He states he was advised to increase his Jardiance to twice daily but even with that blood sugars have been quite high.  Denies any fever.  Other issue is yesterday noticed some chest pain substernally.  He described this as a "soreness ".  Denies any overuse or injury.  Symptoms have been relatively constant and somewhat worse with deep breathing.  No radiation of pain.  No diaphoresis.  No nausea.  No recent exertional pain whatsoever.  Denies cardiac history.  No recent cough.  Past Medical History:  Diagnosis Date   Anxiety    Arthritis    hands   CAD (coronary artery disease)    a. cath 10/23/13: nonobstructive CAD, nl LV function, rec risk factor modification   Chronic kidney disease    h/o kidney stone - passed stone   Diabetes mellitus    type 2   History of pneumonia    Hyperlipidemia    Hypertension    Liver mass    Sinus bradycardia    a. as low as 36 bpm; b. multiple pauses 2.1-2.3 seconds   Ulcer    Umbilical hernia    Past Surgical History:  Procedure Laterality Date   BACK SURGERY     CERVICAL SPINE SURGERY     COLONOSCOPY  2015   Dr Seth Pope 3 TAs, tics, hems   INGUINAL HERNIA REPAIR Bilateral 09/16/2019   Procedure: LAPAROSCOPIC  BILATERAL INGUINAL HERNIA;  Surgeon: Seth Hausen, MD;  Location: [facility];  Service: General;  Laterality: Bilateral;   KNEE SURGERY Right [place] N/A 10/23/2013   Procedure: LEFT HEART CATHETERIZATION WITH CORONARY ANGIOGRAM;  Surgeon: Seth M Martinique, MD;  Location: [facility] CATH LAB;  Service: Cardiovascular;  Laterality: N/A;   MICROLARYNGOSCOPY N/A 09/23/2015   Procedure: MICROLARYNGOSCOPY REMOVAL OF LESION;  Surgeon: Seth Gala, MD;  Location: [place];  Service: ENT;  Laterality: N/A;   NM [place]  08/2010   Seth Pope myoview - normal perfusion in all regions, EF 66%, EKG negative for ischemia, no wall motion abnormalities, normal/low risk study                                        ROTATOR CUFF REPAIR Bilateral [id number]   x2   [place]    reports that he quit smoking about 35 years ago. His smoking use included cigarettes. He has a 45.00 pack-year smoking history. He has never used smokeless tobacco. He reports that he does not currently use alcohol. He  reports that he does not currently use drugs after having used the following drugs: Marijuana. family history includes Diabetes in his brother, paternal grandmother, and sister; Heart attack in his father; Heart disease in his father and paternal grandfather; Hypertension in his father and another family member. Allergies  Allergen Reactions   Penicillins Rash    Has patient had a PCN reaction causing immediate rash, facial/tongue/throat swelling, SOB or lightheadedness with hypotension: Yes Has patient had a PCN reaction causing severe rash involving mucus membranes or skin necrosis: No Has patient had a PCN reaction that required hospitalization No Has patient had a PCN reaction occurring within the last 10 years: No If all of the above answers are "NO", then may proceed with Cephalosporins. Occurred in childhood.     Review of Systems  Constitutional:  Negative  for chills, fever and malaise/fatigue.  Eyes:  Negative for blurred vision.  Respiratory:  Negative for cough, hemoptysis and shortness of breath.   Cardiovascular:  Positive for chest pain. Negative for palpitations, claudication, leg swelling and PND.  Gastrointestinal:  Negative for abdominal pain.  Genitourinary:  Negative for dysuria.  Neurological:  Negative for dizziness, weakness and headaches.      Objective:     BP 136/80 (BP Location: Left Arm, Patient Position: Sitting, Cuff Size: Normal)   Pulse (!) 55   Temp 97.9 F (36.6 C) (Oral)   Ht 5' 10"$  (1.778 m)   Wt 165 lb (74.8 kg)   SpO2 99%   BMI 23.68 kg/m    Physical Exam Vitals reviewed.  Constitutional:      General: He is not in acute distress.    Appearance: He is not ill-appearing.  Cardiovascular:     Rate and Rhythm: Regular rhythm.     Comments: Mild bradycardia with rate around 50 which appears to be his baseline.  Does take metoprolol Pulmonary:     Effort: Pulmonary effort is normal.     Breath sounds: Normal breath sounds. No wheezing or rales.  Musculoskeletal:     Right lower leg: No edema.     Left lower leg: No edema.  Neurological:     Mental Status: He is alert.      No results found for any visits on 04/06/22.    The 10-year ASCVD risk score (Arnett DK, et al., 2019) is: 29.4%    Assessment & Plan:   #1 type 2 diabetes poorly controlled with several home readings over 300 after running out of Tradjenta and difficulties getting prior authorization.  He is on Jardiance and metformin.  His primary office is in process of getting this approved.  We discussed bridging his prescription with Tresiba 10 units once daily.  Sample given.  We had 2 of our CMA's spend over 20 minutes going over insulin injection with recommendation to use 10 units once daily and continue close monitoring of blood sugars 2-3 times daily.  Instructions were given for self administration.  We recommend that once he  gets his Tradjenta to discontinue the insulin and close follow-up with primary provider  #2 atypical chest pain.  Onset yesterday.  Atypical features include sore quality, constant see, lack of association with exertion.  EKG shows sinus bradycardia with no acute ST-T changes.  No significant changes compared with prior tracing. Follow-up with primary for any persistent or worsening symptoms   No follow-ups on file.    Carolann Littler, MD

## 2022-04-06 NOTE — Patient Instructions (Addendum)
Start the insulin injection Seth Pope) 10 units subcutaneous once daily, as instructed (until you can get your Tradjenta approved) and continue with close glucose monitoring.

## 2022-04-11 ENCOUNTER — Encounter: Payer: Self-pay | Admitting: Emergency Medicine

## 2022-04-11 ENCOUNTER — Ambulatory Visit: Payer: Managed Care, Other (non HMO) | Admitting: Emergency Medicine

## 2022-04-11 VITALS — BP 138/84 | HR 68 | Temp 98.0°F | Ht 70.0 in | Wt 167.4 lb

## 2022-04-11 DIAGNOSIS — E785 Hyperlipidemia, unspecified: Secondary | ICD-10-CM

## 2022-04-11 DIAGNOSIS — I152 Hypertension secondary to endocrine disorders: Secondary | ICD-10-CM

## 2022-04-11 DIAGNOSIS — E1159 Type 2 diabetes mellitus with other circulatory complications: Secondary | ICD-10-CM | POA: Diagnosis not present

## 2022-04-11 DIAGNOSIS — E1169 Type 2 diabetes mellitus with other specified complication: Secondary | ICD-10-CM | POA: Diagnosis not present

## 2022-04-11 MED ORDER — TRESIBA FLEXTOUCH 100 UNIT/ML ~~LOC~~ SOPN: 10.0000 [IU] | PEN_INJECTOR | Freq: Every day | SUBCUTANEOUS | 7 refills | Status: DC

## 2022-04-11 MED ORDER — EMPAGLIFLOZIN 25 MG PO TABS: 25.0000 mg | ORAL_TABLET | Freq: Every day | ORAL | 3 refills | Status: DC

## 2022-04-11 NOTE — Patient Instructions (Signed)

## 2022-04-11 NOTE — Assessment & Plan Note (Signed)
Chronic stable condition. Continue atorvastatin 80 mg daily. The 10-year ASCVD risk score (Arnett DK, et al., 2019) is: 30%   Values used to calculate the score:     Age: 72 years     Sex: Male     Is Non-Hispanic African American: No     Diabetic: Yes     Tobacco smoker: No     Systolic Blood Pressure: 0000000 mmHg     Is BP treated: Yes     HDL Cholesterol: 55.3 mg/dL     Total Cholesterol: 130 mg/dL

## 2022-04-11 NOTE — Progress Notes (Signed)
Seth Pope 72 y.o.   Chief Complaint  Patient presents with   Follow-up    F/u diabetes, patient unable to get Tradjenta, Patient seen another provider and got [place]   Wants a referral to endocrinology    HISTORY OF PRESENT ILLNESS: This is a 72 y.o. male here for follow-up of diabetes. Numbers have been abnormally high at home. Had to go see different primary care provider recently due to high glucose levels. Unable to get Tradjenta.  Was started on Tresiba 10 units daily with good results. We increased dose of Jardiance to 20 mg daily still taking metformin. Still taking hydrochlorothiazide and lisinopril for blood pressure No other complaints or medical concerns today.  HPI   Prior to Admission medications   Medication Sig Start Date End Date Taking? Authorizing Provider  ALPRAZolam (XANAX XR) 1 MG 24 hr tablet Take 1 mg by mouth daily.   Yes [provider]  ALPRAZolam (XANAX) 0.25 MG tablet Take 0.25 mg by mouth 2 (two) times daily as needed for anxiety.  03/16/16  Yes [provider]  atorvastatin (LIPITOR) 80 MG tablet TAKE 1 TABLET(80 MG) BY MOUTH DAILY 04/01/22  Yes Margaret Cockerill, Ines Bloomer, MD  cyclobenzaprine (FLEXERIL) 10 MG tablet Take 1 tablet (10 mg total) by mouth at bedtime. Take as needed. 03/09/21  Yes Vinh Sachs, Ines Bloomer, MD  desipramine (NORPRAMIN) 50 MG tablet Take 150 mg by mouth at bedtime.    Yes [provider]  fluticasone (FLONASE) 50 MCG/ACT nasal spray Place 2 sprays into both nostrils daily. 11/26/21  Yes Ailene Ards, NP  gabapentin (NEURONTIN) 300 MG capsule TAKE 2 CAPSULES(600 MG) BY MOUTH TWICE DAILY 10/04/21  Yes Lasonia Casino, Bieber, MD  glucose blood (ONETOUCH VERIO) test strip USE TO CHECK BLOOD SUGAR UP TO THREE TIMES DAILY. 04/05/22  Yes Charidy Cappelletti, Ines Bloomer, MD  hydrochlorothiazide (MICROZIDE) 12.5 MG capsule TAKE 1 CAPSULE(12.5 MG) BY MOUTH DAILY 01/02/22  Yes Caelyn Route, Ines Bloomer, MD  Insulin Degludec (TRESIBA)  100 UNIT/ML SOLN Inject into the skin.   Yes [provider]  JARDIANCE 10 MG TABS tablet TAKE 1 TABLET(10 MG) BY MOUTH DAILY BEFORE AND BREAKFAST 09/01/21  Yes Heavenly Christine, Ines Bloomer, MD  Lancets [facility] ULTRASOFT) lancets Use as instructed 04/05/22  Yes Dagmawi Venable, Ines Bloomer, MD  lisinopril (ZESTRIL) 10 MG tablet TAKE 1 TABLET(10 MG) BY MOUTH DAILY 01/01/22  Yes Anayelli Lai, Ines Bloomer, MD  metFORMIN (GLUCOPHAGE-XR) 500 MG 24 hr tablet TAKE 2 TABLETS(1000 MG) BY MOUTH IN THE MORNING AND AT BEDTIME 03/02/22  Yes Bernd Crom, Ines Bloomer, MD  metoprolol tartrate (LOPRESSOR) 100 MG tablet TAKE 1 TABLET(100 MG) BY MOUTH TWICE DAILY 12/02/21  Yes Retal Tonkinson, Ines Bloomer, MD  TRADJENTA 5 MG TABS tablet TAKE 1 TABLET(5 MG) BY MOUTH DAILY Patient not taking: Reported on 04/06/2022 12/02/21   Horald Pollen, MD    Allergies  Allergen Reactions   Penicillins Rash    Has patient had a PCN reaction causing immediate rash, facial/tongue/throat swelling, SOB or lightheadedness with hypotension: Yes Has patient had a PCN reaction causing severe rash involving mucus membranes or skin necrosis: No Has patient had a PCN reaction that required hospitalization No Has patient had a PCN reaction occurring within the last 10 years: No If all of the above answers are "NO", then may proceed with Cephalosporins. Occurred in childhood.     Patient Active Problem List   Diagnosis Date Noted   Generalized anxiety disorder 03/02/2021   Family history of  coronary artery disease in father 10/23/2013   Tobacco abuse 10/17/2013   Neuropathy, diabetic ([place]) 01/31/2012   Hypertension associated with diabetes ([place])    Dyslipidemia associated with type 2 diabetes mellitus ([place])    Hyperlipidemia     Past Medical History:  Diagnosis Date   Anxiety    Arthritis    hands   CAD (coronary artery disease)    a. cath 10/23/13: nonobstructive CAD, nl LV function, rec risk factor modification   Chronic kidney disease     h/o kidney stone - passed stone   Diabetes mellitus    type 2   History of pneumonia    Hyperlipidemia    Hypertension    Liver mass    Sinus bradycardia    a. as low as 36 bpm; b. multiple pauses 2.1-2.3 seconds   Ulcer    Umbilical hernia     Past Surgical History:  Procedure Laterality Date   BACK SURGERY     CERVICAL SPINE SURGERY     COLONOSCOPY  2015   Dr Collene Mares 3 TAs, tics, hems   INGUINAL HERNIA REPAIR Bilateral 09/16/2019   Procedure: LAPAROSCOPIC BILATERAL INGUINAL HERNIA;  Surgeon: Johnathan Hausen, MD;  Location: [facility];  Service: General;  Laterality: Bilateral;   KNEE SURGERY Right 1970   LEFT HEART CATHETERIZATION WITH CORONARY ANGIOGRAM N/A 10/23/2013   Procedure: LEFT HEART CATHETERIZATION WITH CORONARY ANGIOGRAM;  Surgeon: Peter M Martinique, MD;  Location: [facility] CATH LAB;  Service: Cardiovascular;  Laterality: N/A;   MICROLARYNGOSCOPY N/A 09/23/2015   Procedure: MICROLARYNGOSCOPY REMOVAL OF LESION;  Surgeon: Izora Gala, MD;  Location: [place];  Service: ENT;  Laterality: N/A;   NM [place]  08/2010   bruce myoview - normal perfusion in all regions, EF 66%, EKG negative for ischemia, no wall motion abnormalities, normal/low risk study                                        ROTATOR CUFF REPAIR Bilateral [id number]   x2   UMBILICAL HERNIA REPAIR  1988    Social History   Socioeconomic History   Marital status: Married    Spouse name: Abigail Butts   Number of children: 2   Years of education: 10th    Highest education level: Not on file  Occupational History   Occupation: self-employed (Investment banker, operational)  Tobacco Use   Smoking status: Former    Packs/day: 2.50    Years: 18.00    Total pack years: 45.00    Types: Cigarettes    Quit date: 07/16/1986    Years since quitting: 35.7   Smokeless tobacco: Never  Vaping Use   Vaping Use: Never used  Substance and Sexual Activity   Alcohol use: Not Currently   Drug use: Not Currently    Types: Marijuana    Comment:  Last use May 2021   Sexual activity: Yes  Other Topics Concern   Not on file  Social History Narrative   Lives at home w/ his wife   Right-handed   Daily caffeine   Social Determinants of Health   Financial Resource Strain: Not on file  Food Insecurity: Not on file  Transportation Needs: Not on file  Physical Activity: Not on file  Stress: Not on file  Social Connections: Not on file  Intimate Partner Violence: Not on file    Family History  Problem Relation  Age of Onset   Heart attack Father    Hypertension Father    Heart disease Father    Diabetes Sister        HTN   Diabetes Brother        HTN, heart problems   Hypertension Other    Diabetes Paternal Grandmother    Heart disease Paternal Grandfather    Colon cancer Neg Hx    Rectal cancer Neg Hx    Stomach cancer Neg Hx      Review of Systems  Constitutional: Negative.  Negative for chills and fever.  HENT: Negative.  Negative for congestion and sore throat.   Respiratory: Negative.  Negative for cough and shortness of breath.   Cardiovascular: Negative.  Negative for chest pain and palpitations.  Gastrointestinal:  Negative for abdominal pain, diarrhea, nausea and vomiting.  Genitourinary: Negative.  Negative for dysuria and hematuria.  Skin: Negative.  Negative for rash.  Neurological: Negative.  Negative for dizziness and headaches.  All other systems reviewed and are negative.  Today's Vitals   04/11/22 0842  BP: 138/84  Pulse: 68  Temp: 98 F (36.7 C)  TempSrc: Oral  SpO2: 94%  Weight: 167 lb 6 oz (75.9 kg)  Height: '5\' 10"'$  (1.778 m)   Body mass index is 24.02 kg/m.   Physical Exam Vitals reviewed.  Constitutional:      Appearance: Normal appearance.  HENT:     Head: Normocephalic.  Eyes:     Extraocular Movements: Extraocular movements intact.  Cardiovascular:     Rate and Rhythm: Normal rate.  Pulmonary:     Effort: Pulmonary effort is normal.  Skin:    General: Skin is warm and  dry.  Neurological:     General: No focal deficit present.     Mental Status: He is alert and oriented to person, place, and time.  Psychiatric:        Mood and Affect: Mood normal.        Behavior: Behavior normal.      ASSESSMENT & PLAN: A total of 47 minutes was spent with the patient and counseling/coordination of care regarding preparing for this visit, review of most recent office visit notes, review of most recent blood work results, review of multiple chronic medical conditions and their management, review of all medications and changes made, cardiovascular risks associated with hypertension and diabetes, education on nutrition, prognosis, documentation and need for follow-up.  Problem List Items Addressed This Visit       Cardiovascular and Mediastinum   Hypertension associated with diabetes ([place]) - Primary    Well-controlled hypertension.  Normal blood pressure readings at home. Continue hydrochlorothiazide 12.5 mg daily and lisinopril 10 mg daily. Well-controlled diabetes.  Recommend to continue Tresiba 10 units daily along with Jardiance 25 mg daily and metformin 2000 mg daily. Has been unable to get Tradjenta.  Will discontinue it. Cardiovascular risks associated with hypertension and diabetes discussed. Diet and nutrition discussed.      Relevant Medications   empagliflozin (JARDIANCE) 25 MG TABS tablet   insulin degludec (TRESIBA FLEXTOUCH) 100 UNIT/ML FlexTouch Pen   Other Relevant Orders   Ambulatory referral to Endocrinology     Endocrine   Dyslipidemia associated with type 2 diabetes mellitus (HCC)    Chronic stable condition. Continue atorvastatin 80 mg daily. The 10-year ASCVD risk score (Arnett DK, et al., 2019) is: 30%   Values used to calculate the score:     Age: 70 years  Sex: Male     Is Non-Hispanic African American: No     Diabetic: Yes     Tobacco smoker: No     Systolic Blood Pressure: 0000000 mmHg     Is BP treated: Yes     HDL Cholesterol:  55.3 mg/dL     Total Cholesterol: 130 mg/dL       Relevant Medications   empagliflozin (JARDIANCE) 25 MG TABS tablet   insulin degludec (TRESIBA FLEXTOUCH) 100 UNIT/ML FlexTouch Pen   Other Relevant Orders   Ambulatory referral to Endocrinology   Patient Instructions  Diabetes Mellitus and Nutrition, Adult When you have diabetes, or diabetes mellitus, it is very important to have healthy eating habits because your blood sugar (glucose) levels are greatly affected by what you eat and drink. Eating healthy foods in the right amounts, at about the same times every day, can help you: Manage your blood glucose. Lower your risk of heart disease. Improve your blood pressure. Reach or maintain a healthy weight. What can affect my meal plan? Every person with diabetes is different, and each person has different needs for a meal plan. Your health care provider may recommend that you work with a dietitian to make a meal plan that is best for you. Your meal plan may vary depending on factors such as: The calories you need. The medicines you take. Your weight. Your blood glucose, blood pressure, and cholesterol levels. Your activity level. Other health conditions you have, such as heart or kidney disease. How do carbohydrates affect me? Carbohydrates, also called carbs, affect your blood glucose level more than any other type of food. Eating carbs raises the amount of glucose in your blood. It is important to know how many carbs you can safely have in each meal. This is different for every person. Your dietitian can help you calculate how many carbs you should have at each meal and for each snack. How does alcohol affect me? Alcohol can cause a decrease in blood glucose (hypoglycemia), especially if you use insulin or take certain diabetes medicines by mouth. Hypoglycemia can be a life-threatening condition. Symptoms of hypoglycemia, such as sleepiness, dizziness, and confusion, are similar to  symptoms of having too much alcohol. Do not drink alcohol if: Your health care provider tells you not to drink. You are pregnant, may be pregnant, or are planning to become pregnant. If you drink alcohol: Limit how much you have to: 0-1 drink a day for women. 0-2 drinks a day for men. Know how much alcohol is in your drink. In the [place] one drink equals one 12 oz bottle of beer (355 mL), one 5 oz glass of wine (148 mL), or one 1 oz glass of hard liquor (44 mL). Keep yourself hydrated with water, diet soda, or unsweetened iced tea. Keep in mind that regular soda, juice, and other mixers may contain a lot of sugar and must be counted as carbs. What are tips for following this plan?  Reading food labels Start by checking the serving size on the Nutrition Facts label of packaged foods and drinks. The number of calories and the amount of carbs, fats, and other nutrients listed on the label are based on one serving of the item. Many items contain more than one serving per package. Check the total grams (g) of carbs in one serving. Check the number of grams of saturated fats and trans fats in one serving. Choose foods that have a low amount or none of these fats. Check  the number of milligrams (mg) of salt (sodium) in one serving. Most people should limit total sodium intake to less than 2,300 mg per day. Always check the nutrition information of foods labeled as "low-fat" or "nonfat." These foods may be higher in added sugar or refined carbs and should be avoided. Talk to your dietitian to identify your daily goals for nutrients listed on the label. Shopping Avoid buying canned, pre-made, or processed foods. These foods tend to be high in fat, sodium, and added sugar. Shop around the outside edge of the grocery store. This is where you will most often find fresh fruits and vegetables, bulk grains, fresh meats, and fresh dairy products. Cooking Use low-heat cooking methods, such as baking, instead  of high-heat cooking methods, such as deep frying. Cook using healthy oils, such as olive, canola, or sunflower oil. Avoid cooking with butter, cream, or high-fat meats. Meal planning Eat meals and snacks regularly, preferably at the same times every day. Avoid going long periods of time without eating. Eat foods that are high in fiber, such as fresh fruits, vegetables, beans, and whole grains. Eat 4-6 oz (112-168 g) of lean protein each day, such as lean meat, chicken, fish, eggs, or tofu. One ounce (oz) (28 g) of lean protein is equal to: 1 oz (28 g) of meat, chicken, or fish. 1 egg.  cup (62 g) of tofu. Eat some foods each day that contain healthy fats, such as avocado, nuts, seeds, and fish. What foods should I eat? Fruits Berries. Apples. Oranges. Peaches. Apricots. Plums. Grapes. Mangoes. Papayas. Pomegranates. Kiwi. Cherries. Vegetables Leafy greens, including lettuce, spinach, kale, chard, collard greens, mustard greens, and cabbage. Beets. Cauliflower. Broccoli. Carrots. Green beans. Tomatoes. Peppers. Onions. Cucumbers. Brussels sprouts. Grains Whole grains, such as whole-wheat or whole-grain bread, crackers, tortillas, cereal, and pasta. Unsweetened oatmeal. Quinoa. Brown or wild rice. Meats and other proteins Seafood. Poultry without skin. Lean cuts of poultry and beef. Tofu. Nuts. Seeds. Dairy Low-fat or fat-free dairy products such as milk, yogurt, and cheese. The items listed above may not be a complete list of foods and beverages you can eat and drink. Contact a dietitian for more information. What foods should I avoid? Fruits Fruits canned with syrup. Vegetables Canned vegetables. Frozen vegetables with butter or cream sauce. Grains Refined white flour and flour products such as bread, pasta, snack foods, and cereals. Avoid all processed foods. Meats and other proteins Fatty cuts of meat. Poultry with skin. Breaded or fried meats. Processed meat. Avoid saturated  fats. Dairy Full-fat yogurt, cheese, or milk. Beverages Sweetened drinks, such as soda or iced tea. The items listed above may not be a complete list of foods and beverages you should avoid. Contact a dietitian for more information. Questions to ask a health care provider Do I need to meet with a certified diabetes care and education specialist? Do I need to meet with a dietitian? What number can I call if I have questions? When are the best times to check my blood glucose? Where to find more information: American Diabetes Association: diabetes.org Academy of Nutrition and Dietetics: eatright.[employer] of Diabetes and Digestive and Kidney Diseases: [employer].is Association of Diabetes Care & Education Specialists: diabeteseducator.org Summary It is important to have healthy eating habits because your blood sugar (glucose) levels are greatly affected by what you eat and drink. It is important to use alcohol carefully. A healthy meal plan will help you manage your blood glucose and lower your risk of heart disease.  Your health care provider may recommend that you work with a dietitian to make a meal plan that is best for you. This information is not intended to replace advice given to you by your health care provider. Make sure you discuss any questions you have with your health care provider. Document Revised: 09/04/2019 Document Reviewed: 09/04/2019 Elsevier Patient Education  [place], MD [facility]

## 2022-04-11 NOTE — Assessment & Plan Note (Addendum)
Well-controlled hypertension.  Normal blood pressure readings at home. Continue hydrochlorothiazide 12.5 mg daily and lisinopril 10 mg daily. Well-controlled diabetes.  Recommend to continue Tresiba 10 units daily along with Jardiance 25 mg daily and metformin 2000 mg daily. Has been unable to get Tradjenta.  Will discontinue it. Cardiovascular risks associated with hypertension and diabetes discussed. Diet and nutrition discussed.

## 2022-04-11 NOTE — Addendum Note (Signed)
Addended by: Davina Poke on: 04/11/2022 11:55 AM   Modules accepted: Level of Service

## 2022-04-13 ENCOUNTER — Telehealth: Payer: Self-pay | Admitting: Emergency Medicine

## 2022-04-13 MED ORDER — INSULIN PEN NEEDLE 32G X 6 MM MISC: 3 refills | Status: AC

## 2022-04-13 NOTE — Telephone Encounter (Signed)
New prescription was sent to patient pharmacy

## 2022-04-13 NOTE — Telephone Encounter (Signed)
Patient needs an rx sent in for pen needles - sent to [facility] on [address]

## 2022-05-05 ENCOUNTER — Other Ambulatory Visit: Payer: Self-pay | Admitting: Emergency Medicine

## 2022-05-20 ENCOUNTER — Other Ambulatory Visit: Payer: Self-pay | Admitting: Emergency Medicine

## 2022-05-20 DIAGNOSIS — E119 Type 2 diabetes mellitus without complications: Secondary | ICD-10-CM

## 2022-05-31 ENCOUNTER — Other Ambulatory Visit: Payer: Self-pay | Admitting: Emergency Medicine

## 2022-05-31 DIAGNOSIS — E1159 Type 2 diabetes mellitus with other circulatory complications: Secondary | ICD-10-CM

## 2022-05-31 DIAGNOSIS — I10 Essential (primary) hypertension: Secondary | ICD-10-CM

## 2022-06-30 ENCOUNTER — Other Ambulatory Visit: Payer: Self-pay | Admitting: Emergency Medicine

## 2022-07-08 ENCOUNTER — Other Ambulatory Visit: Payer: Self-pay | Admitting: Emergency Medicine

## 2022-07-08 DIAGNOSIS — I10 Essential (primary) hypertension: Secondary | ICD-10-CM

## 2022-09-01 ENCOUNTER — Encounter: Payer: Self-pay | Admitting: Emergency Medicine

## 2022-09-01 ENCOUNTER — Ambulatory Visit: Payer: Managed Care, Other (non HMO) | Admitting: Emergency Medicine

## 2022-09-01 VITALS — BP 126/84 | HR 55 | Temp 98.0°F | Ht 70.0 in | Wt 157.2 lb

## 2022-09-01 DIAGNOSIS — I152 Hypertension secondary to endocrine disorders: Secondary | ICD-10-CM

## 2022-09-01 DIAGNOSIS — E785 Hyperlipidemia, unspecified: Secondary | ICD-10-CM

## 2022-09-01 DIAGNOSIS — Z794 Long term (current) use of insulin: Secondary | ICD-10-CM

## 2022-09-01 DIAGNOSIS — E1159 Type 2 diabetes mellitus with other circulatory complications: Secondary | ICD-10-CM | POA: Diagnosis not present

## 2022-09-01 DIAGNOSIS — E1169 Type 2 diabetes mellitus with other specified complication: Secondary | ICD-10-CM | POA: Diagnosis not present

## 2022-09-01 DIAGNOSIS — E1142 Type 2 diabetes mellitus with diabetic polyneuropathy: Secondary | ICD-10-CM

## 2022-09-01 DIAGNOSIS — Z7984 Long term (current) use of oral hypoglycemic drugs: Secondary | ICD-10-CM

## 2022-09-01 NOTE — Assessment & Plan Note (Signed)
Chronic stable condition Continue atorvastatin 80 mg daily. 

## 2022-09-01 NOTE — Progress Notes (Signed)
Seth Pope 72 y.o.   Chief Complaint  Patient presents with   Medical Management of Chronic Issues    f/u appt, no concerns     HISTORY OF PRESENT ILLNESS: This is a 72 y.o. male here for 23-month follow-up of diabetes, hypertension, and dyslipidemia Was able to follow-up with endocrinologist on 07/29/2022 Metformin dose was reduced, lisinopril was stopped, continues [place] insulin Has no complaints or medical concerns today Lab Results  Component Value Date   HGBA1C 6.8 (A) 03/02/2022   BP Readings from Last 3 Encounters:  04/11/22 138/84  04/06/22 136/80  03/02/22 120/76   Wt Readings from Last 3 Encounters:  09/01/22 157 lb 4 oz (71.3 kg)  04/11/22 167 lb 6 oz (75.9 kg)  04/06/22 165 lb (74.8 kg)      HPI   Prior to Admission medications   Medication Sig Start Date End Date Taking? Authorizing Provider  ALPRAZolam (XANAX XR) 1 MG 24 hr tablet Take 1 mg by mouth daily.    [provider]  ALPRAZolam Prudy Feeler) 0.25 MG tablet Take 0.25 mg by mouth 2 (two) times daily as needed for anxiety.  03/16/16   [provider]  atorvastatin (LIPITOR) 80 MG tablet TAKE 1 TABLET(80 MG) BY MOUTH DAILY 04/01/22   Georgina Quint, MD  cyclobenzaprine (FLEXERIL) 10 MG tablet Take 1 tablet (10 mg total) by mouth at bedtime. Take as needed. 03/09/21   Georgina Quint, MD  desipramine (NORPRAMIN) 50 MG tablet Take 150 mg by mouth at bedtime.     [provider]  empagliflozin (JARDIANCE) 25 MG TABS tablet Take 1 tablet (25 mg total) by mouth daily before breakfast. 04/11/22   Georgina Quint, MD  fluticasone [facility]) 50 MCG/ACT nasal spray Place 2 sprays into both nostrils daily. 11/26/21   Elenore Paddy, NP  gabapentin (NEURONTIN) 300 MG capsule TAKE 2 CAPSULES(600 MG) BY MOUTH TWICE DAILY 05/20/22   Georgina Quint, MD  glucose blood (ONETOUCH VERIO) test strip USE TO CHECK BLOOD SUGAR UP TO THREE TIMES DAILY 05/05/22    Georgina Quint, MD  hydrochlorothiazide (MICROZIDE) 12.5 MG capsule TAKE 1 CAPSULE(12.5 MG) BY MOUTH DAILY 07/08/22   Georgina Quint, MD  insulin degludec (TRESIBA FLEXTOUCH) 100 UNIT/ML FlexTouch Pen Inject 10 Units into the skin daily. 04/11/22   Georgina Quint, MD  Insulin Pen Needle 32G X 6 MM MISC Attach pen needle to Flexpen to check blood glucose. 04/13/22   Georgina Quint, MD  Lancets [facility] ULTRASOFT) lancets Use as instructed 04/05/22   Georgina Quint, MD  lisinopril (ZESTRIL) 10 MG tablet TAKE 1 TABLET(10 MG) BY MOUTH DAILY 06/30/22   Georgina Quint, MD  metFORMIN (GLUCOPHAGE-XR) 500 MG 24 hr tablet TAKE 2 TABLETS(1000 MG) BY MOUTH IN THE MORNING AND AT BEDTIME 03/02/22   Hansika Leaming, Eilleen Kempf, MD  metoprolol tartrate (LOPRESSOR) 100 MG tablet TAKE 1 TABLET(100 MG) BY MOUTH TWICE DAILY 05/31/22   Georgina Quint, MD    Allergies  Allergen Reactions   Penicillins Rash    Has patient had a PCN reaction causing immediate rash, facial/tongue/throat swelling, SOB or lightheadedness with hypotension: Yes Has patient had a PCN reaction causing severe rash involving mucus membranes or skin necrosis: No Has patient had a PCN reaction that required hospitalization No Has patient had a PCN reaction occurring within the last 10 years: No If all of the above answers are "NO", then may proceed with Cephalosporins.  Occurred in childhood.     Patient Active Problem List   Diagnosis Date Noted   Generalized anxiety disorder 03/02/2021   Family history of coronary artery disease in father 10/23/2013   Tobacco abuse 10/17/2013   Neuropathy, diabetic (HCC) 01/31/2012   Hypertension associated with diabetes (HCC)    Dyslipidemia associated with type 2 diabetes mellitus (HCC)    Hyperlipidemia     Past Medical History:  Diagnosis Date   Anxiety    Arthritis    hands   CAD (coronary artery disease)    a. cath 10/23/13: nonobstructive CAD, nl LV  function, rec risk factor modification   Chronic kidney disease    h/o kidney stone - passed stone   Diabetes mellitus    type 2   History of pneumonia    Hyperlipidemia    Hypertension    Liver mass    Sinus bradycardia    a. as low as 36 bpm; b. multiple pauses 2.1-2.3 seconds   Ulcer    Umbilical hernia     Past Surgical History:  Procedure Laterality Date   BACK SURGERY     CERVICAL SPINE SURGERY     COLONOSCOPY  2015   Dr Loreta Ave 3 TAs, tics, hems   INGUINAL HERNIA REPAIR Bilateral 09/16/2019   Procedure: LAPAROSCOPIC BILATERAL INGUINAL HERNIA;  Surgeon: Luretha Murphy, MD;  Location: [facility] ORS;  Service: General;  Laterality: Bilateral;   KNEE SURGERY Right 1970   LEFT HEART CATHETERIZATION WITH CORONARY ANGIOGRAM N/A 10/23/2013   Procedure: LEFT HEART CATHETERIZATION WITH CORONARY ANGIOGRAM;  Surgeon: Peter M Swaz[place]and, MD;  Location[facility] CATH LAB;  Service: Cardiovascular;  Laterality: N/A;   MICROLARYNGOSCOPY N/A 09/23/2015   Procedure: MICROLARYNGOSCOPY REMOVAL OF LESION;  Surgeon: Serena Colonel, MD;  Location: [facility] OR;  Service: ENT;  Laterality: N/A;   NM MYOCAR PERF WALL MOTION  08/2010   bruce myoview - normal perfusion in all regions, EF 66%, EKG negative for ischemia, no wall motion abnormalities, normal/low risk study                                        ROTATOR CUFF REPAIR Bilateral 1991   x2   UMBILICAL HERNIA REPAIR  1988    Social History   Socioeconomic History   Marital status: Married    Spouse name: Toniann Fail   Number of children: 2   Years of education: 10th    Highest education level: Not on file  Occupational History   Occupation: self-employed (Production designer, theatre/television/film)  Tobacco Use   Smoking status: Former    Current packs/day: 0.00    Average packs/day: 2.5 packs/day for 18.0 years (45.0 ttl pk-yrs)    Types: Cigarettes    Start date: 07/15/1968    Quit date: 07/16/1986    Years since quitting: 36.1   Smokeless tobacco: Never  Vaping Use   Vaping status: Never  Used  Substance and Sexual Activity   Alcohol use: Not Currently   Drug use: Not Currently    Types: Marijuana    Comment: Last use May 2021   Sexual activity: Yes  Other Topics Concern   Not on file  Social History Narrative   Lives at home w/ his wife   Right-handed   Daily caffeine   Social Determinants of Health   Financial Resource Strain: Not on file  Food Insecurity: Not on file  Transportation Needs:  Not on file  Physical Activity: Not on file  Stress: Not on file  Social Connections: Not on file  Intimate Partner Violence: Not on file    Family History  Problem Relation Age of Onset   Heart attack Father    Hypertension Father    Heart disease Father    Diabetes Sister        HTN   Diabetes Brother        HTN, heart problems   Hypertension Other    Diabetes Paternal Grandmother    Heart disease Paternal Grandfather    Colon cancer Neg Hx    Rectal cancer Neg Hx    Stomach cancer Neg Hx      Review of Systems  Constitutional: Negative.  Negative for chills and fever.  HENT: Negative.  Negative for congestion and sore throat.   Respiratory: Negative.  Negative for cough and shortness of breath.   Cardiovascular: Negative.  Negative for chest pain and palpitations.  Gastrointestinal:  Negative for abdominal pain, nausea and vomiting.  Genitourinary: Negative.  Negative for dysuria.  Skin: Negative.  Negative for rash.  Neurological: Negative.  Negative for dizziness and headaches.   Today's Vitals   09/01/22 0806  BP: 126/84  Pulse: (!) 55  Temp: 98 F (36.7 C)  TempSrc: Oral  SpO2: 98%  Weight: 157 lb 4 oz (71.3 kg)  Height: 5\' 10"  (1.778 m)   Body mass index is 22.56 kg/m.    Physical Exam Vitals reviewed.  Constitutional:      Appearance: Normal appearance.  HENT:     Head: Normocephalic.  Eyes:     Extraocular Movements: Extraocular movements intact.     Pupils: Pupils are equal, round, and reactive to light.  Cardiovascular:      Rate and Rhythm: Normal rate and regular rhythm.     Pulses: Normal pulses.     Heart sounds: Normal heart sounds.  Pulmonary:     Effort: Pulmonary effort is normal.     Breath sounds: Normal breath sounds.  Abdominal:     Palpations: Abdomen is soft.     Tenderness: There is no abdominal tenderness.  Skin:    General: Skin is warm and dry.     Capillary Refill: Capillary refill takes less than 2 seconds.  Neurological:     General: No focal deficit present.     Mental Status: He is alert and oriented to person, place, and time.  Psychiatric:        Mood and Affect: Mood normal.        Behavior: Behavior normal.      ASSESSMENT & PLAN: A total of 45 minutes was spent with the patient and counseling/coordination of care regarding preparing for this office, review of most recent office visit notes, review of most recent endocrinologist office visit notes, cardiovascular risks associated with hypertension and diabetes, review of all medications, review of most recent blood work results, education on nutrition, prognosis, documentation, and need for follow-up  Problem List Items Addressed This Visit       Cardiovascular and Mediastinum   Hypertension associated with diabetes (HCC) - Primary    Well-controlled hypertension Not taking lisinopril Continues 12.5 mg of hydrochlorothiazide daily and metoprolol titrate 100 mg twice a day Well-controlled diabetes with hemoglobin A1c of 6.7 from 07/29/2022 Continue Tresiba insulin 10 units daily, Jardiance 25 mg daily and metformin 750 mg twice a day Cardiovascular risks associated with hypertension and diabetes discussed Diet and nutrition discussed  Follow-up in 3 months        Endocrine   Dyslipidemia associated with type 2 diabetes mellitus (HCC)    Chronic stable condition Continue atorvastatin 80 mg daily      Neuropathy, diabetic (HCC)    Had gabapentin reduced to 400 mg twice a day      Patient Instructions  Diabetes  [place] and Nutrition, Adult When you have diabetes, or diabetes mellitus, it is very important to have healthy eating habits because your blood sugar (glucose) levels are greatly affected by what you eat and drink. Eating healthy foods in the right amounts, at about the same times every day, can help you: Manage your blood glucose. Lower your risk of heart disease. Improve your blood pressure. Reach or maintain a healthy weight. What can affect my meal plan? Every person with diabetes is different, and each person has different needs for a meal plan. Your health care provider may recommend that you work with a dietitian to make a meal plan that is best for you. Your meal plan may vary depending on factors such as: The calories you need. The medicines you take. Your weight. Your blood glucose, blood pressure, and cholesterol levels. Your activity level. Other health conditions you have, such as heart or kidney disease. How do carbohydrates affect me? Carbohydrates, also called carbs, affect your blood glucose level more than any other type of food. Eating carbs raises the amount of glucose in your blood. It is important to know how many carbs you can safely have in each meal. This is different for every person. Your dietitian can help you calculate how many carbs you should have at each meal and for each snack. How does alcohol affect me? Alcohol can cause a decrease in blood glucose (hypoglycemia), especially if you use insulin or take certain diabetes medicines by mouth. Hypoglycemia can be a life-threatening condition. Symptoms of hypoglycemia, such as sleepiness, dizziness, and confusion, are similar to symptoms of having too much alcohol. Do not drink alcohol if: Your health care provider tells you not to drink. You are pregnant, may be pregnant, or are planning to become pregnant. If you drink alcohol: Limit how much you have to: 0-1 drink a day for women. 0-2 drinks a day for  men. Know how much alcohol is in your drink. In the [place] one drink equals one 12 oz bottle of beer (355 mL), one 5 oz glass of wine (148 mL), or one 1 oz glass of hard liquor (44 mL). Keep yourself hydrated with water, diet soda, or unsweetened iced tea. Keep in mind that regular soda, juice, and other mixers may contain a lot of sugar and must be counted as carbs. What are tips for following this plan?  Reading food labels Start by checking the serving size on the Nutrition Facts label of packaged foods and drinks. The number of calories and the amount of carbs, fats, and other nutrients listed on the label are based on one serving of the item. Many items contain more than one serving per package. Check the total grams (g) of carbs in one serving. Check the number of grams of saturated fats and trans fats in one serving. Choose foods that have a low amount or none of these fats. Check the number of milligrams (mg) of salt (sodium) in one serving. Most people should limit total sodium intake to less than 2,300 mg per day. Always check the nutrition information of foods labeled as "low-fat" or "nonfat." These  foods may be higher in added sugar or refined carbs and should be avoided. Talk to your dietitian to identify your daily goals for nutrients listed on the label. Shopping Avoid buying canned, pre-made, or processed foods. These foods tend to be high in fat, sodium, and added sugar. Shop around the outside edge of the grocery store. This is where you will most often find fresh fruits and vegetables, bulk grains, fresh meats, and fresh dairy products. Cooking Use low-heat cooking methods, such as baking, instead of high-heat cooking methods, such as deep frying. Cook using healthy oils, such as olive, canola, or sunflower oil. Avoid cooking with butter, cream, or high-fat meats. Meal planning Eat meals and snacks regularly, preferably at the same times every day. Avoid going long periods of  time without eating. Eat foods that are high in fiber, such as fresh fruits, vegetables, beans, and whole grains. Eat 4-6 oz (112-168 g) of lean protein each day, such as lean meat, chicken, fish, eggs, or tofu. One ounce (oz) (28 g) of lean protein is equal to: 1 oz (28 g) of meat, chicken, or fish. 1 egg.  cup (62 g) of tofu. Eat some foods each day that contain healthy fats, such as avocado, nuts, seeds, and fish. What foods should I eat? Fruits Berries. Apples. Oranges. Peaches. Apricots. Plums. Grapes. Mangoes. Papayas. Pomegranates. Kiwi. Cherries. Vegetables Leafy greens, including lettuce, spinach, kale, chard, collard greens, mustard greens, and cabbage. Beets. Cauliflower. Broccoli. Carrots. Green beans. Tomatoes. Peppers. Onions. Cucumbers. Brussels sprouts. Grains Whole grains, such as whole-wheat or whole-grain bread, crackers, tortillas, cereal, and pasta. Unsweetened oatmeal. Quinoa. Brown or wild rice. Meats and other proteins Seafood. Poultry without skin. Lean cuts of poultry and beef. Tofu. Nuts. Seeds. Dairy Low-fat or fat-free dairy products such as milk, yogurt, and cheese. The items listed above may not be a complete list of foods and beverages you can eat and drink. Contact a dietitian for more information. What foods should I avoid? Fruits Fruits canned with syrup. Vegetables Canned vegetables. Frozen vegetables with butter or cream sauce. Grains Refined white flour and flour products such as bread, pasta, snack foods, and cereals. Avoid all processed foods. Meats and other proteins Fatty cuts of meat. Poultry with skin. Breaded or fried meats. Processed meat. Avoid saturated fats. Dairy Full-fat yogurt, cheese, or milk. Beverages Sweetened drinks, such as soda or iced tea. The items listed above may not be a complete list of foods and beverages you should avoid. Contact a dietitian for more information. Questions to ask a health care provider Do I need to  meet with a certified diabetes care and education specialist? Do I need to meet with a dietitian? What number can I call if I have questions? When are the best times to check my blood glucose? Where to find more information: American Diabetes Association: diabetes.org Academy of Nutrition and Dietetics: eatright.[employer] of Diabetes and Digestive and Kidney Diseases: StageSync.si Association of Diabetes Care & Education Specialists: diabeteseducator.org Summary It is important to have healthy eating habits because your blood sugar (glucose) levels are greatly affected by what you eat and drink. It is important to use alcohol carefully. A healthy meal plan will help you manage your blood glucose and lower your risk of heart disease. Your health care provider may recommend that you work with a dietitian to make a meal plan that is best for you. This information is not intended to replace advice given to you by your health care provider.  Make sure you discuss any questions you have with your health care provider. Document Revised: 09/04/2019 Document Reviewed: 09/04/2019 Elsevier Patient Education  2024 Elsevier Inc.      Edwina Barth, MD [facility]

## 2022-09-01 NOTE — Assessment & Plan Note (Addendum)
Well-controlled hypertension Not taking lisinopril Continues 12.5 mg of hydrochlorothiazide daily and metoprolol titrate 100 mg twice a day Well-controlled diabetes with hemoglobin A1c of 6.7 from 07/29/2022 Continue Tresiba insulin 10 units daily, Jardiance 25 mg daily and metformin 750 mg twice a day Cardiovascular risks associated with hypertension and diabetes discussed Diet and nutrition discussed Follow-up in 3 months

## 2022-09-01 NOTE — Patient Instructions (Signed)

## 2022-09-01 NOTE — Assessment & Plan Note (Signed)
Had gabapentin reduced to 400 mg twice a day

## 2022-10-12 ENCOUNTER — Ambulatory Visit (INDEPENDENT_AMBULATORY_CARE_PROVIDER_SITE_OTHER): Payer: Managed Care, Other (non HMO)

## 2022-10-12 ENCOUNTER — Ambulatory Visit: Payer: Managed Care, Other (non HMO) | Admitting: Family Medicine

## 2022-10-12 ENCOUNTER — Other Ambulatory Visit: Payer: Self-pay | Admitting: Family Medicine

## 2022-10-12 ENCOUNTER — Encounter: Payer: Self-pay | Admitting: Family Medicine

## 2022-10-12 VITALS — BP 122/86 | HR 60 | Temp 97.6°F | Ht 70.0 in | Wt 164.0 lb

## 2022-10-12 DIAGNOSIS — W19XXXA Unspecified fall, initial encounter: Secondary | ICD-10-CM | POA: Diagnosis not present

## 2022-10-12 DIAGNOSIS — S6991XA Unspecified injury of right wrist, hand and finger(s), initial encounter: Secondary | ICD-10-CM | POA: Diagnosis not present

## 2022-10-12 DIAGNOSIS — S60551A Superficial foreign body of right hand, initial encounter: Secondary | ICD-10-CM

## 2022-10-12 NOTE — Patient Instructions (Addendum)
Please go downstairs for an x-Ronen of your right hand.  Continue using an ice pack, elevating your hand and taking Tylenol or ibuprofen.  We will be in touch with your results and with recommendations.

## 2022-10-12 NOTE — Progress Notes (Signed)
I am referring him to a hand specialist. They should reach out to him to schedule. His X Herrick shows a probable metallic foreign body in his hand and arthritis.

## 2022-10-12 NOTE — Progress Notes (Signed)
Subjective:     Patient ID: Seth Pope, male    DOB: [birth year], 72 y.o.   MRN: [id number]  Chief Complaint  Patient presents with   Hand Pain    fell on Monday morning, landed on right shoulder and caught himself with hand and now middle finger hurting, anytime he goes to move finger the joint shoots a sharp pain    Hand Pain  Pertinent negatives include no chest pain or tingling.    Discussed the use of AI scribe software for clinical note transcription with the patient, who gave verbal consent to proceed.  History of Present Illness         Right   Complains of right hand pain, specifically middle finger joint since fall 2 days ago.  States he tripped while working outside.  Denies hitting his head.  Denies LOC.  Denies neck or back pain.  He does have some shoulder soreness bilaterally.  States Tdap is up-to-date.     Health Maintenance Due  Topic Date Due   FOOT EXAM  02/26/2021   Zoster Vaccines- Shingrix (2 of 2) 04/27/2021   Colonoscopy  08/07/2022   Diabetic kidney evaluation - eGFR measurement  08/31/2022   Diabetic kidney evaluation - Urine ACR  08/31/2022   HEMOGLOBIN A1C  08/31/2022    Past Medical History:  Diagnosis Date   Anxiety    Arthritis    hands   CAD (coronary artery disease)    a. cath 10/23/13: nonobstructive CAD, nl LV function, rec risk factor modification   Chronic kidney disease    h/o kidney stone - passed stone   Diabetes mellitus    type 2   History of pneumonia    Hyperlipidemia    Hypertension    Liver mass    Sinus bradycardia    a. as low as 36 bpm; b. multiple pauses 2.1-2.3 seconds   Ulcer    Umbilical hernia     Past Surgical History:  Procedure Laterality Date   BACK SURGERY     CERVICAL SPINE SURGERY     COLONOSCOPY  2015   Dr Loreta Ave 3 TAs, tics, hems   INGUINAL HERNIA REPAIR Bilateral 09/16/2019   Procedure: LAPAROSCOPIC BILATERAL INGUINAL HERNIA;  Surgeon: Luretha Murphy, MD;  Location: [facility];  Service:  General;  Laterality: Bilateral;   KNEE SURGERY Right 1970   LEFT HEART CATHETERIZATION WITH CORONARY ANGIOGRAM N/A 10/23/2013   Procedure: LEFT HEART CATHETERIZATION WITH CORONARY ANGIOGRAM;  Surgeon: Peter M Swaziland, MD;  Location[facility] CATH LAB;  Service: Cardiovascular;  Laterality: N/A;   MICROLARYNGOSCOPY N/A 09/23/2015   Procedure: MICROLARYNGOSCOPY REMOVAL OF LESION;  Surgeon: Serena Colonel, MD;  Location: [facility] OR;  Service: ENT;  Laterality: N/A;   NM MYOCAR PERF WALL MOTION  08/2010   bruce myoview - normal perfusion in all regions, EF 66%, EKG negative for ischemia, no wall motion abnormalities, normal/low risk study                                        ROTATOR CUFF REPAIR Bilateral 1991   x2   UMBILICAL HERNIA REPAIR  1988    Family History  Problem Relation Age of Onset   Heart attack Father    Hypertension Father    Heart disease Father    Diabetes Sister        HTN   Diabetes Brother  HTN, heart problems   Hypertension Other    Diabetes Paternal Grandmother    Heart disease Paternal Grandfather    Colon cancer Neg Hx    Rectal cancer Neg Hx    Stomach cancer Neg Hx     Social History   Socioeconomic History   Marital status: Married    Spouse name: Toniann Fail   Number of children: 2   Years of education: 10th    Highest education level: Not on file  Occupational History   Occupation: self-employed (Production designer, theatre/television/film)  Tobacco Use   Smoking status: Former    Current packs/day: 0.00    Average packs/day: 2.5 packs/day for 18.0 years (45.0 ttl pk-yrs)    Types: Cigarettes    Start date: 07/15/1968    Quit date: 07/16/1986    Years since quitting: 36.2   Smokeless tobacco: Never  Vaping Use   Vaping status: Never Used  Substance and Sexual Activity   Alcohol use: Not Currently   Drug use: Not Currently    Types: Marijuana    Comment: Last use May 2021   Sexual activity: Yes  Other Topics Concern   Not on file  Social History Narrative   Lives at home w/ his  wife   Right-handed   Daily caffeine   Social Determinants of Health   Financial Resource Strain: Not on file  Food Insecurity: Not on file  Transportation Needs: Not on file  Physical Activity: Not on file  Stress: Not on file  Social Connections: Not on file  Intimate Partner Violence: Not on file    Outpatient Medications Prior to Visit  Medication Sig Dispense Refill   ALPRAZolam (XANAX XR) 1 MG 24 hr tablet Take 1 mg by mouth daily.     ALPRAZolam (XANAX) 0.25 MG tablet Take 0.25 mg by mouth 2 (two) times daily as needed for anxiety.   0   atorvastatin (LIPITOR) 80 MG tablet TAKE 1 TABLET(80 MG) BY MOUTH DAILY 90 tablet 3   cyclobenzaprine (FLEXERIL) 10 MG tablet Take 1 tablet (10 mg total) by mouth at bedtime. Take as needed. 30 tablet 1   desipramine (NORPRAMIN) 50 MG tablet Take 150 mg by mouth at bedtime.      empagliflozin (JARDIANCE) 25 MG TABS tablet Take 1 tablet (25 mg total) by mouth daily before breakfast. 90 tablet 3   fluticasone (FLONASE) 50 MCG/ACT nasal spray Place 2 sprays into both nostrils daily. 16 g 6   gabapentin (NEURONTIN) 300 MG capsule TAKE 2 CAPSULES(600 MG) BY MOUTH TWICE DAILY 360 capsule 1   glucose blood (ONETOUCH VERIO) test strip USE TO CHECK BLOOD SUGAR UP TO THREE TIMES DAILY 100 strip 7   hydrochlorothiazide (MICROZIDE) 12.5 MG capsule TAKE 1 CAPSULE(12.5 MG) BY MOUTH DAILY 90 capsule 1   insulin degludec (TRESIBA FLEXTOUCH) 100 UNIT/ML FlexTouch Pen Inject 10 Units into the skin daily. 3 mL 7   Insulin Pen Needle 32G X 6 MM MISC Attach pen needle to Flexpen to check blood glucose. 50 each 3   Lancets (ONETOUCH ULTRASOFT) lancets Use as instructed 100 each 12   lisinopril (ZESTRIL) 10 MG tablet TAKE 1 TABLET(10 MG) BY MOUTH DAILY 90 tablet 1   metFORMIN (GLUCOPHAGE-XR) 500 MG 24 hr tablet TAKE 2 TABLETS(1000 MG) BY MOUTH IN THE MORNING AND AT BEDTIME 360 tablet 1   metoprolol tartrate (LOPRESSOR) 100 MG tablet TAKE 1 TABLET(100 MG) BY MOUTH  TWICE DAILY 180 tablet 1   No facility-administered medications prior to  visit.    Allergies  Allergen Reactions   Penicillins Rash    Has patient had a PCN reaction causing immediate rash, facial/tongue/throat swelling, SOB or lightheadedness with hypotension: Yes Has patient had a PCN reaction causing severe rash involving mucus membranes or skin necrosis: No Has patient had a PCN reaction that required hospitalization No Has patient had a PCN reaction occurring within the last 10 years: No If all of the above answers are "NO", then may proceed with Cephalosporins. Occurred in childhood.     Review of Systems  Constitutional:  Negative for chills and fever.  Respiratory:  Negative for shortness of breath.   Cardiovascular:  Negative for chest pain and palpitations.  Gastrointestinal:  Negative for abdominal pain, nausea and vomiting.  Musculoskeletal:  Positive for falls and joint pain. Negative for back pain and neck pain.  Neurological:  Negative for dizziness, tingling, speech change, focal weakness and headaches.       Objective:    Physical Exam Constitutional:      General: He is not in acute distress.    Appearance: He is not ill-appearing.  HENT:     Head: Atraumatic.     Mouth/Throat:     Mouth: Mucous membranes are moist.  Eyes:     Extraocular Movements: Extraocular movements intact.     Conjunctiva/sclera: Conjunctivae normal.  Cardiovascular:     Rate and Rhythm: Normal rate.  Pulmonary:     Effort: Pulmonary effort is normal.  Musculoskeletal:     Right wrist: Normal.     Left wrist: Normal.     Right hand: Swelling and bony tenderness present. Decreased range of motion. Decreased strength of finger abduction. Normal sensation. Normal capillary refill. Normal pulse.     Left hand: Normal.     Cervical back: Normal range of motion and neck supple.     Comments: Good sensation, capillary refill and pulse. Right 3rd MCP joint with swelling, limited ROM  and TTP   Skin:    General: Skin is warm and dry.  Neurological:     General: No focal deficit present.     Mental Status: He is alert and oriented to person, place, and time.     Cranial Nerves: No cranial nerve deficit.     Motor: No weakness.     Coordination: Coordination normal.     Gait: Gait normal.  Psychiatric:        Mood and Affect: Mood normal.        Behavior: Behavior normal.        Thought Content: Thought content normal.      BP 122/86 (BP Location: Left Arm, Patient Position: Sitting, Cuff Size: Large)   Pulse 60   Temp 97.6 F (36.4 C) (Temporal)   Ht 5\' 10"  (1.778 m)   Wt 164 lb (74.4 kg)   SpO2 98%   BMI 23.53 kg/m  Wt Readings from Last 3 Encounters:  10/12/22 164 lb (74.4 kg)  09/01/22 157 lb 4 oz (71.3 kg)  04/11/22 167 lb 6 oz (75.9 kg)       Assessment & Plan:   Problem List Items Addressed This Visit   None Visit Diagnoses     Hand injury, right, initial encounter    -  Primary   Relevant Orders   DG Hand Complete Right   Fall, initial encounter       Relevant Orders   DG Hand Complete Right      Mechanical fall 2  days ago.  Tripped while working outside.  No red flag symptoms.  Right hand injury.  Pain controlled with Tylenol and ibuprofen.  Stat x-Noemi ordered.  Discussed RICE.  He will most likely need a splint pending x-[place] results.  Reviewed immunizations and Tdap up-to-date.   I am having Seth Pope maintain his desipramine, ALPRAZolam, ALPRAZolam, cyclobenzaprine, fluticasone, metFORMIN, atorvastatin, onetouch ultrasoft, empagliflozin, Tresiba FlexTouch, Insulin Pen Needle, OneTouch Verio, gabapentin, metoprolol tartrate, lisinopril, and hydrochlorothiazide.  No orders of the defined types were placed in this encounter.

## 2022-10-14 ENCOUNTER — Ambulatory Visit (INDEPENDENT_AMBULATORY_CARE_PROVIDER_SITE_OTHER): Payer: Managed Care, Other (non HMO) | Admitting: Orthopedic Surgery

## 2022-10-14 DIAGNOSIS — S63652A Sprain of metacarpophalangeal joint of right middle finger, initial encounter: Secondary | ICD-10-CM

## 2022-10-14 DIAGNOSIS — S63639A Sprain of interphalangeal joint of unspecified finger, initial encounter: Secondary | ICD-10-CM

## 2022-10-14 NOTE — Progress Notes (Signed)
Seth Pope - 72 y.o. male MRN [id number]  Date of birth: 1950-09-04  Office Visit Note: Visit Date: 10/14/2022 PCP: Georgina Quint, MD Referred by: Avanell Shackleton, NP-C  Subjective: No chief complaint on file.  HPI: Seth Pope is a pleasant 72 y.o. male who presents today for ongoing pain and swelling at the right hand long finger MP and PIP joint after recent fall.  Was wearing a ring on the long finger at the time of injury.  Was seen in the primary care setting, underwent x-rays and was given follow-up with hand surgery.  Currently, is having ongoing pain and swelling particular with range of motion or grip of the right hand.  Pertinent ROS were reviewed with the patient and found to be negative unless otherwise specified above in HPI.   Visit Reason: right hand Hand dominance: right Occupation: retired Diabetic: Yes/ 6.8 Heart/Lung History: none Blood Thinners: none  Prior Testing/EMG: xrays 10/12/22 Injections (Date): none Treatments: none Prior Surgery: none  Assessment & Plan: Visit Diagnoses: No diagnosis found.  Plan: Extensive discussion was had with the patient today regarding his right hand injury.  I reviewed his prior x-rays with him today.  There is an incidental finding of a potential for metallic object in the index finger, which is not of concern today, this is likely chronic given that it is asymptomatic.  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  He does not demonstrate any instability of the long finger with collateral stress testing at the PIP or MP, extensor and flexor tendons are gliding appropriately without notable subluxation on the extensor side.  For the time being, I would like him to trial buddy taping of the index and long finger in order to preserve motion and give stability to the long finger.  When  placed into this position today, he states that his symptoms did improve.  He can continue with buddy taping for the next 4 to 6 weeks as needed with encouragement for range of motion.  I will plan on seeing him back in roughly 6 weeks to track his progress, he can cancel or reschedule should his symptoms improve.  We did discuss his significant underlying arthritis prickly of the long finger MCP as well as potential treatment options in the future in the form of injections or potential arthroplasty.  He expressed understanding, will follow-up in the future as needed should his symptoms continue to worsen.  Follow-up: Return in about 6 weeks (around 11/25/2022).   Meds & Orders: No orders of the defined types were placed in this encounter.  No orders of the defined types were placed in this encounter.    Procedures: No procedures performed      Clinical History: No specialty comments available.  He reports that he quit smoking about 36 years ago. His smoking use included cigarettes. He started smoking about 54 years ago. He has a 45 pack-year smoking history. He has never used smokeless tobacco.  Recent Labs    03/02/22 0907  HGBA1C 6.8*    Objective:   Vital Signs: There were no vitals taken for this visit.  Physical Exam  Gen: Well-appearing, in no acute distress; non-toxic CV: Regular Rate. Well-perfused. Warm.  Resp: Breathing unlabored on room air; no wheezing. Psych: Fluid speech in conversation; appropriate affect; normal  thought process  Ortho Exam Right hand: - Notable swelling and tenderness at the long finger MP and PIP, stable to collateral stress testing at both joints - Appropriate tendon excursion of the extensor apparatus in both wrist flexion and extension of the long finger, no significant subluxation appreciated - Able to perform composite fist with passive motion - Hands well perfused, normal color and capillary refill  Imaging: No results found.  Past  Medical/Family/Surgical/Social History: Medications & Allergies reviewed per EMR, new medications updated. Patient Active Problem List   Diagnosis Date Noted   Generalized anxiety disorder 03/02/2021   Family history of coronary artery disease in father 10/23/2013   Tobacco abuse 10/17/2013   Neuropathy, diabetic (HCC) 01/31/2012   Hypertension associated with diabetes (HCC)    Dyslipidemia associated with type 2 diabetes mellitus (HCC)    Hyperlipidemia    Past Medical History:  Diagnosis Date   Anxiety    Arthritis    hands   CAD (coronary artery disease)    a. cath 10/23/13: nonobstructive CAD, nl LV function, rec risk factor modification   Chronic kidney disease    h/o kidney stone - passed stone   Diabetes mellitus    type 2   History of pneumonia    Hyperlipidemia    Hypertension    Liver mass    Sinus bradycardia    a. as low as 36 bpm; b. multiple pauses 2.1-2.3 seconds   Ulcer    Umbilical hernia    Family History  Problem Relation Age of Onset   Heart attack Father    Hypertension Father    Heart disease Father    Diabetes Sister        HTN   Diabetes Brother        HTN, heart problems   Hypertension Other    Diabetes Paternal Grandmother    Heart disease Paternal Grandfather    Colon cancer Neg Hx    Rectal cancer Neg Hx    Stomach cancer Neg Hx    Past Surgical History:  Procedure Laterality Date   BACK SURGERY     CERVICAL SPINE SURGERY     COLONOSCOPY  2015   Dr Loreta Ave 3 TAs, tics, hems   INGUINAL HERNIA REPAIR Bilateral 09/16/2019   Procedure: LAPAROSCOPIC BILATERAL INGUINAL HERNIA;  Surgeon: Luretha Murphy, MD;  Location: [facility];  Service: General;  Laterality: Bilateral;   KNEE SURGERY Right 1970   LEFT HEART CATHETERIZATION WITH CORONARY ANGIOGRAM N/A 10/23/2013   Procedure: LEFT HEART CATHETERIZATION WITH CORONARY ANGIOGRAM;  Surgeon: Peter M Swaziland, MD;  Location: [facility] CATH LAB;  Service: Cardiovascular;  Laterality: N/A;   MICROLARYNGOSCOPY N/A  09/23/2015   Procedure: MICROLARYNGOSCOPY REMOVAL OF LESION;  Surgeon: Serena Colonel, MD;  Location: [facility] OR;  Service: ENT;  Laterality: N/A;   NM MYOCAR PERF WALL MOTION  08/2010   bruce myoview - normal perfusion in all regions, EF 66%, EKG negative for ischemia, no wall motion abnormalities, normal/low risk study                                        ROTATOR CUFF REPAIR Bilateral 1991   x2   UMBILICAL HERNIA REPAIR  1988   Social History   Occupational History   Occupation: self-employed (Production designer, theatre/television/film)  Tobacco Use   Smoking status: Former    Current packs/day: 0.00    Average packs/day:  2.5 packs/day for 18.0 years (45.0 ttl pk-yrs)    Types: Cigarettes    Start date: 07/15/1968    Quit date: 07/16/1986    Years since quitting: 36.2   Smokeless tobacco: Never  Vaping Use   Vaping status: Never Used  Substance and Sexual Activity   Alcohol use: Not Currently   Drug use: Not Currently    Types: Marijuana    Comment: Last use May 2021   Sexual activity: Yes    Keshan Reha Fara Boros) Denese Killings, M.D. [facility] 8:35 AM

## 2022-10-28 ENCOUNTER — Other Ambulatory Visit: Payer: Self-pay | Admitting: Emergency Medicine

## 2022-10-28 DIAGNOSIS — E1165 Type 2 diabetes mellitus with hyperglycemia: Secondary | ICD-10-CM

## 2022-11-15 ENCOUNTER — Other Ambulatory Visit: Payer: Self-pay | Admitting: Emergency Medicine

## 2022-11-15 DIAGNOSIS — E1159 Type 2 diabetes mellitus with other circulatory complications: Secondary | ICD-10-CM

## 2022-11-27 ENCOUNTER — Other Ambulatory Visit: Payer: Self-pay | Admitting: Emergency Medicine

## 2022-11-27 DIAGNOSIS — I10 Essential (primary) hypertension: Secondary | ICD-10-CM

## 2022-12-10 ENCOUNTER — Other Ambulatory Visit: Payer: Self-pay | Admitting: Emergency Medicine

## 2022-12-10 DIAGNOSIS — I10 Essential (primary) hypertension: Secondary | ICD-10-CM

## 2023-02-13 ENCOUNTER — Ambulatory Visit: Payer: Self-pay | Admitting: Emergency Medicine

## 2023-02-13 NOTE — Telephone Encounter (Signed)
Patient stated that he tested positive yesterday and symptoms are headache and fatigued and weakness and SOB

## 2023-02-13 NOTE — Telephone Encounter (Signed)
Copied from CRM [id number]. Topic: Clinical - Red Word Triage >> Feb 13, 2023  7:41 AM Adele Barthel wrote:  Red Word that prompted transfer to Nurse Triage: Pt tested positive for COVID yesterday, has a very severe headache. Has taken Paklovid in the past and has found beneficial. He spoke with someone yesterday who said he would be contacted back today.

## 2023-02-13 NOTE — Telephone Encounter (Signed)
Pt tested positive for COVID and requesting medication. PCP out of office

## 2023-02-13 NOTE — Telephone Encounter (Signed)
Copied from CRM [id number]. Topic: Clinical - Red Word Triage >> Feb 13, 2023  7:41 AM Seth Pope wrote: Red Word that prompted transfer to Nurse Triage: Pt tested positive for COVID yesterday, has a very severe headache. Has taken Paklovid in the past and has found beneficial. He spoke with someone yesterday who said he would be contacted back today.  Chief Complaint: COVID tested positive yesterday Symptoms: Headache, fatigue, tightness in chest, little sore throat, chills but no fever that I am aware of Frequency: symptoms started yesterday Pertinent Negatives: Patient denies SOB or chest pain Disposition: [] ED /[] Urgent Care (no appt availability in office) / [] Appointment(In office/virtual)/ []  [facility]/ [x] Home Care/ [] Refused Recommended Disposition /[] [facility]/ []  Follow-up with PCP Additional Notes: pt stated he spoke with someone yesterday that took all his information regarding testing positive and was informed that medication would be called in for him - however no medication was called in.  pt would like to request Paxlovoid to help with recovery and due to age   Reason for Disposition  [1] PERSISTING SYMPTOMS OF COVID-19 AND [2] NEW symptom AND [3] that sounds mild  [1] COVID-19 diagnosed by positive lab test (e.g., PCR, rapid self-test kit) AND [2] NO symptoms (e.g., cough, fever, others)  Answer Assessment - Initial Assessment Questions 1. COVID-19 DIAGNOSIS: "How do you know that you have COVID?" (e.g., positive lab test or self-test, diagnosed by doctor or NP/PA, symptoms after exposure).     Home test yesterday - results were positive 2. COVID-19 EXPOSURE: "Was there any known exposure to COVID before the symptoms began?" CDC Definition of close contact: within 6 feet (2 meters) for a total of 15 minutes or more over a 24-hour period.      Daughter tested positive yesterday - then patient took test  3. ONSET: "When did the COVID-19 symptoms start?"       yesterday 4. WORST SYMPTOM: "What is your worst symptom?" (e.g., cough, fever, shortness of breath, muscle aches)     littleTightness in chest, cough  Non-productive 5. COUGH: "Do you have a cough?" If Yes, ask: "How bad is the cough?"       Yes - not bad 6. FEVER: "Do you have a fever?" If Yes, ask: "What is your temperature, how was it measured, and when did it start?"     N/a 7. RESPIRATORY STATUS: "Describe your breathing?" (e.g., normal; shortness of breath, wheezing, unable to speak)      no 8. BETTER-SAME-WORSE: "Are you getting better, staying the same or getting worse compared to yesterday?"  If getting worse, ask, "In what way?"     Using tylenol and ibuprofen for headache: helps some  9. OTHER SYMPTOMS: "Do you have any other symptoms?"  (e.g., chills, fatigue, headache, loss of smell or taste, muscle pain, sore throat)    Headache, fatigue, tightness in chest, little sore throat, chills but no fever that I am aware of 10. HIGH RISK DISEASE: "Do you have any chronic medical problems?" (e.g., asthma, heart or lung disease, weak immune system, obesity, etc.)       DM, HTN 11. VACCINE: "Have you had the COVID-19 vaccine?" If Yes, ask: "Which one, how many shots, when did you get it?"       yes 12. PREGNANCY: "Is there any chance you are pregnant?" "When was your last menstrual period?"       no 13. O2 SATURATION MONITOR:  "Do you use an oxygen saturation monitor (  pulse oximeter) at home?" If Yes, ask "What is your reading (oxygen level) today?" "What is your usual oxygen saturation reading?" (e.g., 95%)       N/a  Protocols used: Coronavirus (COVID-19) Diagnosed or Suspected-A-[facility], Coronavirus (COVID-19) Persisting Symptoms Follow-up Call-A-[facility]

## 2023-02-13 NOTE — Telephone Encounter (Signed)
I don't see any recent renal function from last year we would need this to prescribe paxlovid and I also do not see date of symptom onset (needs to be within 5 days of first symptom onset to prescribe). Okay for visit today or tomorrow if in window.

## 2023-02-14 ENCOUNTER — Encounter: Payer: Self-pay | Admitting: Nurse Practitioner

## 2023-02-14 ENCOUNTER — Ambulatory Visit (INDEPENDENT_AMBULATORY_CARE_PROVIDER_SITE_OTHER): Payer: Managed Care, Other (non HMO) | Admitting: Nurse Practitioner

## 2023-02-14 VITALS — BP 140/72 | HR 77 | Temp 97.6°F | Wt 157.6 lb

## 2023-02-14 DIAGNOSIS — I152 Hypertension secondary to endocrine disorders: Secondary | ICD-10-CM | POA: Diagnosis not present

## 2023-02-14 DIAGNOSIS — E1159 Type 2 diabetes mellitus with other circulatory complications: Secondary | ICD-10-CM

## 2023-02-14 DIAGNOSIS — Z794 Long term (current) use of insulin: Secondary | ICD-10-CM | POA: Diagnosis not present

## 2023-02-14 DIAGNOSIS — U071 COVID-19: Secondary | ICD-10-CM

## 2023-02-14 MED ORDER — NIRMATRELVIR/RITONAVIR (PAXLOVID)TABLET: 3.0000 | ORAL_TABLET | Freq: Two times a day (BID) | ORAL | 0 refills | Status: AC

## 2023-02-14 NOTE — Telephone Encounter (Signed)
Patient has been scheduled to to been at another [facility] primary

## 2023-02-14 NOTE — Progress Notes (Signed)
 Acute Office Visit  Subjective:    Patient ID: Seth Pope, male    DOB: 12-20-1950, 71 y.o.   MRN: [id number]  Chief Complaint  Patient presents with   Acute Visit    PT C/O of Fatigue, headache since Sunday Took OTC medication Tylenol , Denies fever, Tested Positive at home on Sunday   URI  This is a new problem. The current episode started in the past 7 days. The problem has been unchanged. There has been no fever. Associated symptoms include congestion, coughing, headaches and joint pain. Pertinent negatives include no abdominal pain, chest pain, diarrhea, dysuria, ear pain, joint swelling, nausea, neck pain, plugged ear sensation, rash, rhinorrhea, sinus pain, sneezing, sore throat, swollen glands, vomiting or wheezing. He has tried acetaminophen  and NSAIDs for the symptoms. The treatment provided moderate relief.  Had 2 positive COVID test at home  Renal function completed by Dr. Odella Jacobson 01/2023: eGFr 64ml/min, Creatinine 0.89, BUN 16.  Outpatient Medications Prior to Visit  Medication Sig   ALPRAZolam  (XANAX  XR) 1 MG 24 hr tablet Take 1 mg by mouth daily.   ALPRAZolam  (XANAX ) 0.25 MG tablet Take 0.25 mg by mouth 2 (two) times daily as needed for anxiety.    atorvastatin  (LIPITOR ) 80 MG tablet TAKE 1 TABLET(80 MG) BY MOUTH DAILY   cyclobenzaprine  (FLEXERIL ) 10 MG tablet Take 1 tablet (10 mg total) by mouth at bedtime. Take as needed.   desipramine  (NORPRAMIN ) 50 MG tablet Take 150 mg by mouth at bedtime.    empagliflozin  (JARDIANCE ) 25 MG TABS tablet Take 1 tablet (25 mg total) by mouth daily before breakfast.   fluticasone  (FLONASE ) 50 MCG/ACT nasal spray Place 2 sprays into both nostrils daily.   gabapentin  (NEURONTIN ) 300 MG capsule TAKE 2 CAPSULES(600 MG) BY MOUTH TWICE DAILY   glucose blood (ONETOUCH VERIO) test strip USE TO CHECK BLOOD SUGAR UP TO THREE TIMES DAILY   hydrochlorothiazide  (MICROZIDE ) 12.5 MG capsule TAKE 1 CAPSULE(12.5 MG) BY MOUTH DAILY   Insulin  Pen  Needle 32G X 6 MM MISC Attach pen needle to Flexpen to check blood glucose.   Lancets (ONETOUCH ULTRASOFT) lancets Use as instructed   metFORMIN  (GLUCOPHAGE -XR) 500 MG 24 hr tablet TAKE 2 TABLETS(1000 MG) BY MOUTH IN THE MORNING AND AT BEDTIME   metoprolol  tartrate (LOPRESSOR ) 100 MG tablet TAKE 1 TABLET(100 MG) BY MOUTH TWICE DAILY   TRESIBA  FLEXTOUCH 100 UNIT/ML FlexTouch Pen ADMINISTER 10 UNITS UNDER THE SKIN DAILY   [DISCONTINUED] lisinopril  (ZESTRIL ) 10 MG tablet TAKE 1 TABLET(10 MG) BY MOUTH DAILY   No facility-administered medications prior to visit.    Reviewed past medical and social history.  Review of Systems  HENT:  Positive for congestion. Negative for ear pain, rhinorrhea, sinus pain, sneezing and sore throat.   Respiratory:  Positive for cough. Negative for wheezing.   Cardiovascular:  Negative for chest pain.  Gastrointestinal:  Negative for abdominal pain, diarrhea, nausea and vomiting.  Genitourinary:  Negative for dysuria.  Musculoskeletal:  Positive for joint pain. Negative for neck pain.  Skin:  Negative for rash.  Neurological:  Positive for headaches.   Per HPI     Objective:    Physical Exam Constitutional:      General: He is not in acute distress. HENT:     Nose: No nasal tenderness or mucosal edema.     Right Nostril: No occlusion.     Left Nostril: No occlusion.     Right Turbinates: Not enlarged, swollen or pale.  Left Turbinates: Not enlarged, swollen or pale.     Right Sinus: No maxillary sinus tenderness or frontal sinus tenderness.     Left Sinus: No maxillary sinus tenderness or frontal sinus tenderness.     Mouth/Throat:     Pharynx: Uvula midline.     Tonsils: No tonsillar exudate or tonsillar abscesses.  Eyes:     Extraocular Movements: Extraocular movements intact.     Conjunctiva/sclera: Conjunctivae normal.  Cardiovascular:     Rate and Rhythm: Normal rate and regular rhythm.     Pulses: Normal pulses.     Heart sounds: Normal  heart sounds.  Pulmonary:     Effort: Pulmonary effort is normal.     Breath sounds: Normal breath sounds.  Musculoskeletal:     Cervical back: Normal range of motion and neck supple.  Lymphadenopathy:     Cervical: No cervical adenopathy.  Neurological:     Mental Status: He is alert and oriented to person, place, and time.    BP (!) 140/72 (BP Location: Left Arm, Patient Position: Sitting, Cuff Size: Large) Comment: recheck reading done manual  Pulse 77   Temp 97.6 F (36.4 C)   Wt 157 lb 9.6 oz (71.5 kg)   SpO2 98%   BMI 22.61 kg/m    No results found for any visits on 02/14/23.     Assessment & Plan:   Problem List Items Addressed This Visit     Hypertension associated with diabetes [facility])   Per patient: lisinopril  was discontinued by endocrinology 2months ago. BP Readings from Last 3 Encounters:  02/14/23 (!) 140/72  10/12/22 122/86  09/01/22 126/84    Advised to Monitor BP at home in AM F/up with pcp if BP remains >140/80.      Other Visit Diagnoses       COVID-19    -  Primary   Relevant Medications   nirmatrelvir /ritonavir  (PAXLOVID ) 20 x 150 MG & 10 x 100MG  TABS      Meds ordered this encounter  Medications   nirmatrelvir /ritonavir  (PAXLOVID ) 20 x 150 MG & 10 x 100MG  TABS    Sig: Take 3 tablets by mouth 2 (two) times daily for 5 days. (Take nirmatrelvir  150 mg two tablets twice daily for 5 days and ritonavir  100 mg one tablet twice daily for 5 days) Patient GFR is 92    Dispense:  30 tablet    Refill:  0    Supervising Provider:   BERNETA ELSIE SAYRE [[id number]]   Return if symptoms worsen or fail to improve.  Roselie Mood, NP

## 2023-02-14 NOTE — Telephone Encounter (Signed)
 See message, due to no recent renal function we cannot prescribe paxlovid. I need date of symptom onset not date of positive test to see if he is truly in window. Needs acute visit today with anyone if in window.

## 2023-02-14 NOTE — Patient Instructions (Addendum)
 Monitor BP at home in AM F/up with pcp if BP remains >140/80. Maintain adequate oral hydration Avoid decongestants if you have high blood pressure.  Ok to use Coricidin HBP for chest and sinus congestion Use mucinex DM or Robitussin  or delsym for cough without sinus congestion  You can use plain Tylenol  or Advil  for fever, chills and achyness. Use cool mist humidifier at bedtime to help with nasal congestion and cough.  Cold/cough medications may have tylenol  or ibuprofen  or guaifenesin or dextromethophan in them, so be careful not to take beyond the recommended dose for each of these medications.  COVID-19 COVID-19 is an infection caused by a virus called SARS-CoV-2. This type of virus is called a coronavirus. People with COVID-19 may: Have little to no symptoms. Have mild to moderate symptoms that affect their lungs and breathing. Get very sick. What are the causes? COVID-19 is caused by a virus. This virus may be in the air as droplets or on surfaces. It can spread from an infected person when they cough, sneeze, speak, sing, or breathe. You may become infected if: You breathe in the infected droplets in the air. You touch an object that has the virus on it. What increases the risk? You are at risk of getting COVID-19 if you have been around someone with the infection. You may be more likely to get very sick if: You are 45 years old or older. You have certain medical conditions, such as: Heart disease. Diabetes. Chronic respiratory disease. Cancer. Pregnancy. You are immunocompromised. This means your body cannot fight infections easily. You have a disability or trouble moving, meaning you're immobile. What are the signs or symptoms? People may have different symptoms from COVID-19. The symptoms can also be mild to severe. They often show up in 5-6 days after being infected. But they can take up to 14 days to appear. Common symptoms are: Cough. Feeling tired. New loss of  taste or smell. Fever. Less common symptoms are: Sore throat. Headache. Body or muscle aches. Diarrhea. A skin rash or odd-colored fingers or toes. Red or irritated eyes. Sometimes, COVID-19 does not cause symptoms. How is this diagnosed? COVID-19 can be diagnosed with tests done in the lab or at home. Fluid from your nose, mouth, or lungs will be used to check for the virus. How is this treated? Treatment for COVID-19 depends on how sick you are. Mild symptoms can be treated at home with rest, fluids, and over-the-counter medicines. Severe symptoms may be treated in a hospital intensive care unit (ICU). If you have symptoms and are at risk of getting very sick, you may be given a medicine that fights viruses. This medicine is called an antiviral. How is this prevented? To protect yourself from COVID-19: Know your risk factors. Get vaccinated. If your body cannot fight infections easily, talk to your provider about treatment to help prevent COVID-19. Stay at least 1 meter away from others. Wear a well-fitted mask when: You can't stay at a distance from people. You're in a place with poor air flow. Try to be in open spaces with good air flow when in public. Wash your hands often or use an alcohol-based hand sanitizer. Cover your nose and mouth when coughing and sneezing. If you think you have COVID-19 or have been around someone who has it, stay home and be by yourself for 5-10 days. Where to find more information Centers for Disease Control and Prevention (CDC): tonerpromos.no World Health Organization [facility]): visitdestination.com.br Get help right  away if: You have trouble breathing or get short of breath. You have pain or pressure in your chest. You cannot speak or move any part of your body. You are confused. Your symptoms get worse. These symptoms may be an emergency. Get help right away. Call 911. Do not wait to see if the symptoms will go away. Do not drive yourself to the hospital. This  information is not intended to replace advice given to you by your health care provider. Make sure you discuss any questions you have with your health care provider. Document Revised: 02/08/2022 Document Reviewed: 10/15/2021 Elsevier Patient Education  2024 [employer].

## 2023-02-14 NOTE — Assessment & Plan Note (Signed)
 Per patient: lisinopril was discontinued by endocrinology 2months ago. BP Readings from Last 3 Encounters:  02/14/23 (!) 140/72  10/12/22 122/86  09/01/22 126/84    Advised to Monitor BP at home in AM F/up with pcp if BP remains >140/80.

## 2023-02-16 ENCOUNTER — Encounter: Payer: Self-pay | Admitting: Nurse Practitioner

## 2023-02-16 NOTE — Telephone Encounter (Signed)
 Copied from CRM [id number]. Topic: Clinical - Medical Advice >> Feb 16, 2023 10:06 AM Seth Pope wrote: Reason for CRM: Patient is requesting a callback regarding how he is doing. Patient states he has COVID and wants to update clinic he previously seen. He also has questions regarding COVID

## 2023-02-16 NOTE — Telephone Encounter (Signed)
 Pt just wanted to thank Helene Shoe, and the office for everything that you have done for him, listening to him, and taking care of him during his visit. Pt states he could not go on with his day without calling and thanking you all.

## 2023-03-06 ENCOUNTER — Ambulatory Visit (INDEPENDENT_AMBULATORY_CARE_PROVIDER_SITE_OTHER): Payer: Managed Care, Other (non HMO) | Admitting: Emergency Medicine

## 2023-03-06 ENCOUNTER — Encounter: Payer: Self-pay | Admitting: Emergency Medicine

## 2023-03-06 ENCOUNTER — Ambulatory Visit (INDEPENDENT_AMBULATORY_CARE_PROVIDER_SITE_OTHER): Payer: Managed Care, Other (non HMO)

## 2023-03-06 VITALS — BP 118/72 | HR 53 | Temp 97.7°F | Ht 70.0 in | Wt 164.0 lb

## 2023-03-06 DIAGNOSIS — E1159 Type 2 diabetes mellitus with other circulatory complications: Secondary | ICD-10-CM | POA: Diagnosis not present

## 2023-03-06 DIAGNOSIS — Z7984 Long term (current) use of oral hypoglycemic drugs: Secondary | ICD-10-CM

## 2023-03-06 DIAGNOSIS — F411 Generalized anxiety disorder: Secondary | ICD-10-CM | POA: Diagnosis not present

## 2023-03-06 DIAGNOSIS — I152 Hypertension secondary to endocrine disorders: Secondary | ICD-10-CM | POA: Diagnosis not present

## 2023-03-06 DIAGNOSIS — E1142 Type 2 diabetes mellitus with diabetic polyneuropathy: Secondary | ICD-10-CM | POA: Diagnosis not present

## 2023-03-06 DIAGNOSIS — E785 Hyperlipidemia, unspecified: Secondary | ICD-10-CM

## 2023-03-06 DIAGNOSIS — E1169 Type 2 diabetes mellitus with other specified complication: Secondary | ICD-10-CM

## 2023-03-06 DIAGNOSIS — M25552 Pain in left hip: Secondary | ICD-10-CM | POA: Insufficient documentation

## 2023-03-06 LAB — CBC WITH DIFFERENTIAL/PLATELET
Basophils Absolute: 0 10*3/uL (ref 0.0–0.1)
Basophils Relative: 0.7 % (ref 0.0–3.0)
Eosinophils Absolute: 0.2 10*3/uL (ref 0.0–0.7)
Eosinophils Relative: 3.7 % (ref 0.0–5.0)
HCT: 49.6 % (ref 39.0–52.0)
Hemoglobin: 16.4 g/dL (ref 13.0–17.0)
Lymphocytes Relative: 26.6 % (ref 12.0–46.0)
Lymphs Abs: 1.5 10*3/uL (ref 0.7–4.0)
MCHC: 33.1 g/dL (ref 30.0–36.0)
MCV: 90.8 fL (ref 78.0–100.0)
Monocytes Absolute: 0.7 10*3/uL (ref 0.1–1.0)
Monocytes Relative: 11.7 % (ref 3.0–12.0)
Neutro Abs: 3.3 10*3/uL (ref 1.4–7.7)
Neutrophils Relative %: 57.3 % (ref 43.0–77.0)
Platelets: 310 10*3/uL (ref 150.0–400.0)
RBC: 5.47 Mil/uL (ref 4.22–5.81)
RDW: 13.6 % (ref 11.5–15.5)
WBC: 5.8 10*3/uL (ref 4.0–10.5)

## 2023-03-06 LAB — COMPREHENSIVE METABOLIC PANEL
ALT: 25 U/L (ref 0–53)
AST: 19 U/L (ref 0–37)
Albumin: 4.1 g/dL (ref 3.5–5.2)
Alkaline Phosphatase: 93 U/L (ref 39–117)
BUN: 17 mg/dL (ref 6–23)
CO2: 30 meq/L (ref 19–32)
Calcium: 8.9 mg/dL (ref 8.4–10.5)
Chloride: 101 meq/L (ref 96–112)
Creatinine, Ser: 0.82 mg/dL (ref 0.40–1.50)
GFR: 89.03 mL/min (ref >60.00)
Glucose, Bld: 139 mg/dL — ABNORMAL HIGH (ref 70–99)
Potassium: 4 meq/L (ref 3.5–5.1)
Sodium: 138 meq/L (ref 135–145)
Total Bilirubin: 0.6 mg/dL (ref 0.2–1.2)
Total Protein: 6.7 g/dL (ref 6.0–8.3)

## 2023-03-06 LAB — MICROALBUMIN / CREATININE URINE RATIO
Creatinine,U: 73.8 mg/dL
Microalb Creat Ratio: 0.9 mg/g (ref 0.0–30.0)
Microalb, Ur: <0.7 mg/dL (ref 0.0–1.9)

## 2023-03-06 LAB — LIPID PANEL
Cholesterol: 115 mg/dL (ref 0–200)
HDL: 48.3 mg/dL (ref >39.00)
LDL Cholesterol: 49 mg/dL (ref 0–99)
NonHDL: 66.86
Total CHOL/HDL Ratio: 2
Triglycerides: 88 mg/dL (ref 0.0–149.0)
VLDL: 17.6 mg/dL (ref 0.0–40.0)

## 2023-03-06 LAB — POCT GLYCOSYLATED HEMOGLOBIN (HGB A1C): Hemoglobin A1C: 7.1 % — AB (ref 4.0–5.6)

## 2023-03-06 NOTE — Assessment & Plan Note (Signed)
BP Readings from Last 3 Encounters:  03/06/23 118/72  02/14/23 (!) 140/72  10/12/22 122/86  Well-controlled hypertension Continue hydrochlorothiazide 12.5 mg daily and metoprolol tartrate 100 mg twice a day Well-controlled diabetes with hemoglobin A1c of 7.1 Continues daily Tresiba 10 units, metformin 1000 mg twice a day, and Jardiance 25 mg daily Cardiovascular risks associated with hypertension and diabetes discussed Diet and nutrition discussed Blood work and urine done today Follow-up in 6 months

## 2023-03-06 NOTE — Assessment & Plan Note (Signed)
Fairly recent onset and affecting quality of life Short walks help X-Wahid done today.  Report reviewed Pain management discussed Recommend orthopedic evaluation Referral to sports medicine placed today.

## 2023-03-06 NOTE — Assessment & Plan Note (Signed)
Stable and well-controlled.  Follows up with psychiatry on a regular basis.  Their office is handling his anxiety medications.

## 2023-03-06 NOTE — Assessment & Plan Note (Signed)
Chronic stable condition Continue atorvastatin 80 mg daily. 

## 2023-03-06 NOTE — Assessment & Plan Note (Signed)
Had gabapentin reduced to 400 mg twice a day

## 2023-03-06 NOTE — Progress Notes (Signed)
Seth Pope 73 y.o.   Chief Complaint  Patient presents with   Follow-up    6 month f/u.    HISTORY OF PRESENT ILLNESS: This is a 73 y.o. male here for 65-month follow-up of chronic medical conditions including diabetes, hypertension, and dyslipidemia. Complaining of left hip pain for couple of months Recently had COVID about 3 weeks ago.  Took Paxlovid with good results.  Much better today. No other complaints or medical concerns today.   HPI   Prior to Admission medications   Medication Sig Start Date End Date Taking? Authorizing Provider  ALPRAZolam (XANAX XR) 1 MG 24 hr tablet Take 1 mg by mouth daily.   Yes [provider]  ALPRAZolam (XANAX) 0.25 MG tablet Take 0.25 mg by mouth 2 (two) times daily as needed for anxiety.  03/16/16  Yes [provider]  atorvastatin (LIPITOR) 80 MG tablet TAKE 1 TABLET(80 MG) BY MOUTH DAILY 04/01/22  Yes Charmain Diosdado, Eilleen Kempf, MD  cyclobenzaprine (FLEXERIL) 10 MG tablet Take 1 tablet (10 mg total) by mouth at bedtime. Take as needed. 03/09/21  Yes Natanael Saladin, Eilleen Kempf, MD  desipramine (NORPRAMIN) 50 MG tablet Take 150 mg by mouth at bedtime.    Yes [provider]  empagliflozin (JARDIANCE) 25 MG TABS tablet Take 1 tablet (25 mg total) by mouth daily before breakfast. 04/11/22  Yes Erhardt Dada, Eilleen Kempf, MD  fluticasone [facility]) 50 MCG/ACT nasal spray Place 2 sprays into both nostrils daily. 11/26/21  Yes Elenore Paddy, NP  gabapentin (NEURONTIN) 300 MG capsule TAKE 2 CAPSULES(600 MG) BY MOUTH TWICE DAILY 05/20/22  Yes Divina Neale, [place], MD  glucose blood (ONETOUCH VERIO) test strip USE TO CHECK BLOOD SUGAR UP TO THREE TIMES DAILY 05/05/22  Yes Chadrick Sprinkle, Eilleen Kempf, MD  hydrochlorothiazide (MICROZIDE) 12.5 MG capsule TAKE 1 CAPSULE(12.5 MG) BY MOUTH DAILY 12/11/22  Yes Mikenzi Raysor, Eilleen Kempf, MD  Insulin Pen Needle 32G X 6 MM MISC Attach pen needle to Flexpen to check blood glucose. 04/13/22  Yes Hazem Kenner, Eilleen Kempf, MD   Lancets [facility] ULTRASOFT) lancets Use as instructed 04/05/22  Yes Allesandra Huebsch, Eilleen Kempf, MD  metFORMIN (GLUCOPHAGE-XR) 500 MG 24 hr tablet TAKE 2 TABLETS(1000 MG) BY MOUTH IN THE MORNING AND AT BEDTIME 10/28/22  Yes Haylea Schlichting, Eilleen Kempf, MD  metoprolol tartrate (LOPRESSOR) 100 MG tablet TAKE 1 TABLET(100 MG) BY MOUTH TWICE DAILY 11/27/22  Yes Lyndal Alamillo, Eilleen Kempf, MD  TRESIBA FLEXTOUCH 100 UNIT/ML FlexTouch Pen ADMINISTER 10 UNITS UNDER THE SKIN DAILY 11/15/22  Yes Georgina Quint, MD    Allergies  Allergen Reactions   Penicillins Rash    Has patient had a PCN reaction causing immediate rash, facial/tongue/throat swelling, SOB or lightheadedness with hypotension: Yes Has patient had a PCN reaction causing severe rash involving mucus membranes or skin necrosis: No Has patient had a PCN reaction that required hospitalization No Has patient had a PCN reaction occurring within the last 10 years: No If all of the above answers are "NO", then may proceed with Cephalosporins. Occurred in childhood.     Patient Active Problem List   Diagnosis Date Noted   Generalized anxiety disorder 03/02/2021   Family history of coronary artery disease in father 10/23/2013   Tobacco abuse 10/17/2013   Neuropathy, diabetic (HCC) 01/31/2012   Hypertension associated with diabetes (HCC)    Dyslipidemia associated with type 2 diabetes mellitus (HCC)    Hyperlipidemia     Past Medical History:  Diagnosis Date   Anxiety  Arthritis    hands   CAD (coronary artery disease)    a. cath 10/23/13: nonobstructive CAD, nl LV function, rec risk factor modification   Chronic kidney disease    h/o kidney stone - passed stone   Diabetes mellitus    type 2   History of pneumonia    Hyperlipidemia    Hypertension    Liver mass    Sinus bradycardia    a. as low as 36 bpm; b. multiple pauses 2.1-2.3 seconds   Ulcer    Umbilical hernia     Past Surgical History:  Procedure Laterality Date   BACK  SURGERY     CERVICAL SPINE SURGERY     COLONOSCOPY  2015   Dr Loreta Ave 3 TAs, tics, hems   INGUINAL HERNIA REPAIR Bilateral 09/16/2019   Procedure: LAPAROSCOPIC BILATERAL INGUINAL HERNIA;  Surgeon: Luretha Murphy, MD;  Location: [facility] ORS;  Service: General;  Laterality: Bilateral;   KNEE SURGERY Right 1970   LEFT HEART CATHETERIZATION WITH CORONARY ANGIOGRAM N/A 10/23/2013   Procedure: LEFT HEART CATHETERIZATION WITH CORONARY ANGIOGRAM;  Surgeon: Peter M Swazil[place], MD;  Location: [facility] CATH LAB;  Service: Cardiovascular;  Laterality: N/A;   MICROLARYNGOSCOPY N/A 09/23/2015   Procedure: MICROLARYNGOSCOPY REMOVAL OF LESION;  Surgeon: Serena Colonel, MD;  Location: [facility] OR;  Service: ENT;  Laterality: N/A;   NM MYOCAR PERF WALL MOTION  08/2010   bruce myoview - normal perfusion in all regions, EF 66%, EKG negative for ischemia, no wall motion abnormalities, normal/low risk study                                        ROTATOR CUFF REPAIR Bilateral 1991   x2   UMBILICAL HERNIA REPAIR  1988    Social History   Socioeconomic History   Marital status: Married    Spouse name: Toniann Fail   Number of children: 2   Years of education: 10th    Highest education level: 11th grade  Occupational History   Occupation: self-employed (Production designer, theatre/television/film)  Tobacco Use   Smoking status: Former    Current packs/day: 0.00    Average packs/day: 2.5 packs/day for 18.0 years (45.0 ttl pk-yrs)    Types: Cigarettes    Start date: 07/15/1968    Quit date: 07/16/1986    Years since quitting: 36.6   Smokeless tobacco: Never  Vaping Use   Vaping status: Never Used  Substance and Sexual Activity   Alcohol use: Not Currently   Drug use: Not Currently    Types: Marijuana    Comment: Last use May 2021   Sexual activity: Yes  Other Topics Concern   Not on file  Social History Narrative   Lives at home w/ his wife   Right-handed   Daily caffeine   Social Drivers of Health   Financial Resource Strain: Low Risk  (03/05/2023)    Overall Financial Resource Strain (CARDIA)    Difficulty of Paying Living Expenses: Not hard at all  Food Insecurity: No Food Insecurity (03/05/2023)   Hunger Vital Sign    Worried About Running Out of Food in the Last Year: Never true    Ran Out of Food in the Last Year: Never true  Transportation Needs: No Transportation Needs (03/05/2023)   PRAPARE - Administrator, Civil Service (Medical): No    Lack of Transportation (Non-Medical): No  Physical Activity: Insufficiently  Active (03/05/2023)   Exercise Vital Sign    Days of Exercise per Week: 2 days    Minutes of Exercise per Session: 30 min  Stress: No Stress Concern Present (03/05/2023)   [employer] of Occupational Health - Occupational Stress Questionnaire    Feeling of Stress : Only a little  Social Connections: Moderately Integrated (03/05/2023)   Social Connection and Isolation Panel [NHANES]    Frequency of Communication with Friends and Family: More than three times a week    Frequency of Social Gatherings with Friends and Family: Once a week    Attends Religious Services: More than 4 times per year    Active Member of [employer] or Organizations: No    Attends Engineer, structural: Not on file    Marital Status: Married  Catering manager Violence: Not on file    Family History  Problem Relation Age of Onset   Heart attack Father    Hypertension Father    Heart disease Father    Diabetes Sister        HTN   Diabetes Brother        HTN, heart problems   Hypertension Other    Diabetes Paternal Grandmother    Heart disease Paternal Grandfather    Colon cancer Neg Hx    Rectal cancer Neg Hx    Stomach cancer Neg Hx      Review of Systems  Constitutional: Negative.  Negative for chills and fever.  HENT: Negative.  Negative for congestion and sore throat.   Respiratory: Negative.  Negative for cough and shortness of breath.   Cardiovascular: Negative.  Negative for chest pain and palpitations.   Gastrointestinal:  Negative for nausea and vomiting.  Genitourinary: Negative.  Negative for dysuria.  Musculoskeletal:  Positive for joint pain (Left hip).  Skin: Negative.  Negative for rash.  Neurological: Negative.  Negative for dizziness and headaches.  All other systems reviewed and are negative.   Today's Vitals   03/06/23 0759  BP: 118/72  Pulse: (!) 53  Temp: 97.7 F (36.5 C)  TempSrc: Oral  SpO2: 100%  Weight: 164 lb (74.4 kg)  Height: 5\' 10"  (1.778 m)   Body mass index is 23.53 kg/m.   Physical Exam Vitals reviewed.  Constitutional:      Appearance: Normal appearance.  HENT:     Head: Normocephalic.     Mouth/Throat:     Mouth: Mucous membranes are moist.     Pharynx: Oropharynx is clear.  Eyes:     Extraocular Movements: Extraocular movements intact.     Pupils: Pupils are equal, round, and reactive to light.  Cardiovascular:     Rate and Rhythm: Normal rate and regular rhythm.     Pulses: Normal pulses.     Heart sounds: Normal heart sounds.  Pulmonary:     Effort: Pulmonary effort is normal.     Breath sounds: Normal breath sounds.  Abdominal:     Palpations: Abdomen is soft.     Tenderness: There is no abdominal tenderness.  Musculoskeletal:     Cervical back: No tenderness.     Comments: Left hip: Painful range of motion  Skin:    General: Skin is warm and dry.  Neurological:     Mental Status: He is alert and oriented to person, place, and time.  Psychiatric:        Mood and Affect: Mood normal.        Behavior: Behavior normal.   Results  for orders placed or performed in visit on 03/06/23 (from the past 24 hours)  POCT glycosylated hemoglobin (Hb A1C)     Status: Abnormal   Collection Time: 03/06/23  8:27 AM  Result Value Ref Range   Hemoglobin A1C 7.1 (A) 4.0 - 5.6 %   HbA1c POC (<> result, manual entry)     HbA1c, POC (prediabetic range)     HbA1c, POC (controlled diabetic range)     DG HIP UNILAT W OR W/O PELVIS 2-3 VIEWS  LEFT Result Date: 03/06/2023 CLINICAL DATA:  Left hip pain for 2-3 months.  No known injury. EXAM: DG HIP (WITH OR WITHOUT PELVIS) 2-3V LEFT COMPARISON:  Abdominopelvic CT 08/22/2019. FINDINGS: The mineralization and alignment are normal. There is no evidence of acute fracture or dislocation. No evidence of femoral head osteonecrosis. Minimal degenerative changes of the hips and sacroiliac joints bilaterally. Patient is status post lower lumbar fusion and ventral hernia repair. There are scattered vascular calcifications. IMPRESSION: No acute osseous findings or significant arthropathic changes. Minimal degenerative changes of the hips and sacroiliac joints. Electronically Signed   By: Carey Bullocks M.D.   On: 03/06/2023 08:58     ASSESSMENT & PLAN: A total of 45 minutes was spent with the patient and counseling/coordination of care regarding preparing for this visit, review of most recent office visit notes, review of multiple chronic medical conditions and their management, cardiovascular risks associated with hypertension diabetes, review of all medications, review of most recent bloodwork results including interpretation of today's hemoglobin A1c, review of health maintenance items, education on nutrition, prognosis, documentation, and need for follow up.   Problem List Items Addressed This Visit       Cardiovascular and Mediastinum   Hypertension associated with diabetes (HCC) - Primary   BP Readings from Last 3 Encounters:  03/06/23 118/72  02/14/23 (!) 140/72  10/12/22 122/86  Well-controlled hypertension Continue hydrochlorothiazide 12.5 mg daily and metoprolol tartrate 100 mg twice a day Well-controlled diabetes with hemoglobin A1c of 7.1 Continues daily Tresiba 10 units, metformin 1000 mg twice a day, and Jardiance 25 mg daily Cardiovascular risks associated with hypertension and diabetes discussed Diet and nutrition discussed Blood work and urine done today Follow-up in 6  months       Relevant Orders   POCT glycosylated hemoglobin (Hb A1C)   Urine Microalbumin w/creat. ratio   CBC with Differential/Platelet   Comprehensive metabolic panel   Lipid panel     Endocrine   Dyslipidemia associated with type 2 diabetes mellitus (HCC)   Chronic stable condition Continue atorvastatin 80 mg daily      Relevant Orders   POCT glycosylated hemoglobin (Hb A1C)   Urine Microalbumin w/creat. ratio   CBC with Differential/Platelet   Comprehensive metabolic panel   Lipid panel   Neuropathy, diabetic (HCC)   Had gabapentin reduced to 400 mg twice a day         Other   Generalized anxiety disorder   Stable and well-controlled. Follows up with psychiatry on a regular basis. Their office is handling his anxiety medications.       Left hip pain   Fairly recent onset and affecting quality of life Short walks help X-Aslan done today.  Report reviewed Pain management discussed Recommend orthopedic evaluation Referral to sports medicine placed today.      Relevant Orders   Ambulatory referral to Sports Medicine   DG HIP UNILAT W OR W/O PELVIS 2-3 VIEWS LEFT   Patient Instructions  Health  Maintenance After Age 80 After age 71, you are at a higher risk for certain long-term diseases and infections as well as injuries from falls. Falls are a major cause of broken bones and head injuries in people who are older than age 51. Getting regular preventive care can help to keep you healthy and well. Preventive care includes getting regular testing and making lifestyle changes as recommended by your health care provider. Talk with your health care provider about: Which screenings and tests you should have. A screening is a test that checks for a disease when you have no symptoms. A diet and exercise plan that is right for you. What should I know about screenings and tests to prevent falls? Screening and testing are the best ways to find a health problem early. Early  diagnosis and treatment give you the best chance of managing medical conditions that are common after age 82. Certain conditions and lifestyle choices may make you more likely to have a fall. Your health care provider may recommend: Regular vision checks. Poor vision and conditions such as cataracts can make you more likely to have a fall. If you wear glasses, make sure to get your prescription updated if your vision changes. Medicine review. Work with your health care provider to regularly review all of the medicines you are taking, including over-the-counter medicines. Ask your health care provider about any side effects that may make you more likely to have a fall. Tell your health care provider if any medicines that you take make you feel dizzy or sleepy. Strength and balance checks. Your health care provider may recommend certain tests to check your strength and balance while standing, walking, or changing positions. Foot health exam. Foot pain and numbness, as well as not wearing proper footwear, can make you more likely to have a fall. Screenings, including: Osteoporosis screening. Osteoporosis is a condition that causes the bones to get weaker and break more easily. Blood pressure screening. Blood pressure changes and medicines to control blood pressure can make you feel dizzy. Depression screening. You may be more likely to have a fall if you have a fear of falling, feel depressed, or feel unable to do activities that you used to do. Alcohol use screening. Using too much alcohol can affect your balance and may make you more likely to have a fall. Follow these instructions at home: Lifestyle Do not drink alcohol if: Your health care provider tells you not to drink. If you drink alcohol: Limit how much you have to: 0-1 drink a day for women. 0-2 drinks a day for men. Know how much alcohol is in your drink. In the [place] one drink equals one 12 oz bottle of beer (355 mL), one 5 oz glass of  wine (148 mL), or one 1 oz glass of hard liquor (44 mL). Do not use any products that contain nicotine or tobacco. These products include cigarettes, chewing tobacco, and vaping devices, such as e-cigarettes. If you need help quitting, ask your health care provider. Activity  Follow a regular exercise program to stay fit. This will help you maintain your balance. Ask your health care provider what types of exercise are appropriate for you. If you need a cane or walker, use it as recommended by your health care provider. Wear supportive shoes that have nonskid soles. Safety  Remove any tripping hazards, such as rugs, cords, and clutter. Install safety equipment such as grab bars in bathrooms and safety rails on stairs. Keep rooms and walkways  well-lit. General instructions Talk with your health care provider about your risks for falling. Tell your health care provider if: You fall. Be sure to tell your health care provider about all falls, even ones that seem minor. You feel dizzy, tiredness (fatigue), or off-balance. Take over-the-counter and prescription medicines only as told by your health care provider. These include supplements. Eat a healthy diet and maintain a healthy weight. A healthy diet includes low-fat dairy products, low-fat (lean) meats, and fiber from whole grains, beans, and lots of fruits and vegetables. Stay current with your vaccines. Schedule regular health, dental, and eye exams. Summary Having a healthy lifestyle and getting preventive care can help to protect your health and wellness after age 45. Screening and testing are the best way to find a health problem early and help you avoid having a fall. Early diagnosis and treatment give you the best chance for managing medical conditions that are more common for people who are older than age 25. Falls are a major cause of broken bones and head injuries in people who are older than age 59. Take precautions to prevent a fall  at home. Work with your health care provider to learn what changes you can make to improve your health and wellness and to prevent falls. This information is not intended to replace advice given to you by your health care provider. Make sure you discuss any questions you have with your health care provider. Document Revised: 06/22/2020 Document Reviewed: 06/22/2020 Elsevier Patient Education  2024 Elsevier Inc.     Edwina Barth, MD [facility]

## 2023-03-06 NOTE — Patient Instructions (Signed)
Health Maintenance After Age 71 After age 71, you are at a higher risk for certain long-term diseases and infections as well as injuries from falls. Falls are a major cause of broken bones and head injuries in people who are older than age 71. Getting regular preventive care can help to keep you healthy and well. Preventive care includes getting regular testing and making lifestyle changes as recommended by your health care provider. Talk with your health care provider about: Which screenings and tests you should have. A screening is a test that checks for a disease when you have no symptoms. A diet and exercise plan that is right for you. What should I know about screenings and tests to prevent falls? Screening and testing are the best ways to find a health problem early. Early diagnosis and treatment give you the best chance of managing medical conditions that are common after age 71. Certain conditions and lifestyle choices may make you more likely to have a fall. Your health care provider may recommend: Regular vision checks. Poor vision and conditions such as cataracts can make you more likely to have a fall. If you wear glasses, make sure to get your prescription updated if your vision changes. Medicine review. Work with your health care provider to regularly review all of the medicines you are taking, including over-the-counter medicines. Ask your health care provider about any side effects that may make you more likely to have a fall. Tell your health care provider if any medicines that you take make you feel dizzy or sleepy. Strength and balance checks. Your health care provider may recommend certain tests to check your strength and balance while standing, walking, or changing positions. Foot health exam. Foot pain and numbness, as well as not wearing proper footwear, can make you more likely to have a fall. Screenings, including: Osteoporosis screening. Osteoporosis is a condition that causes  the bones to get weaker and break more easily. Blood pressure screening. Blood pressure changes and medicines to control blood pressure can make you feel dizzy. Depression screening. You may be more likely to have a fall if you have a fear of falling, feel depressed, or feel unable to do activities that you used to do. Alcohol use screening. Using too much alcohol can affect your balance and may make you more likely to have a fall. Follow these instructions at home: Lifestyle Do not drink alcohol if: Your health care provider tells you not to drink. If you drink alcohol: Limit how much you have to: 0-1 drink a day for women. 0-2 drinks a day for men. Know how much alcohol is in your drink. In the [place] one drink equals one 12 oz bottle of beer (355 mL), one 5 oz glass of wine (148 mL), or one 1 oz glass of hard liquor (44 mL). Do not use any products that contain nicotine or tobacco. These products include cigarettes, chewing tobacco, and vaping devices, such as e-cigarettes. If you need help quitting, ask your health care provider. Activity  Follow a regular exercise program to stay fit. This will help you maintain your balance. Ask your health care provider what types of exercise are appropriate for you. If you need a cane or walker, use it as recommended by your health care provider. Wear supportive shoes that have nonskid soles. Safety  Remove any tripping hazards, such as rugs, cords, and clutter. Install safety equipment such as grab bars in bathrooms and safety rails on stairs. Keep rooms and walkways   well-lit. General instructions Talk with your health care provider about your risks for falling. Tell your health care provider if: You fall. Be sure to tell your health care provider about all falls, even ones that seem minor. You feel dizzy, tiredness (fatigue), or off-balance. Take over-the-counter and prescription medicines only as told by your health care provider. These include  supplements. Eat a healthy diet and maintain a healthy weight. A healthy diet includes low-fat dairy products, low-fat (lean) meats, and fiber from whole grains, beans, and lots of fruits and vegetables. Stay current with your vaccines. Schedule regular health, dental, and eye exams. Summary Having a healthy lifestyle and getting preventive care can help to protect your health and wellness after age 71. Screening and testing are the best way to find a health problem early and help you avoid having a fall. Early diagnosis and treatment give you the best chance for managing medical conditions that are more common for people who are older than age 71. Falls are a major cause of broken bones and head injuries in people who are older than age 71. Take precautions to prevent a fall at home. Work with your health care provider to learn what changes you can make to improve your health and wellness and to prevent falls. This information is not intended to replace advice given to you by your health care provider. Make sure you discuss any questions you have with your health care provider. Document Revised: 06/22/2020 Document Reviewed: 06/22/2020 Elsevier Patient Education  2024 Elsevier Inc.  

## 2023-03-07 NOTE — Progress Notes (Unsigned)
Rubin Payor, PhD, LAT, ATC acting as a scribe for Clementeen Graham, MD.  Jeanann Lewandowsky is a 73 y.o. male who presents to [facility] at [facility] today for L hip pain x 3 months, progressively worsening. Pt locates pain to the lateral aspect of the L hip and then will get a shooting pain into the proximal anterior thigh. He also notes slight pain in the R hip. He notes a hx of lumbar fusion and has a reign cage in his low back performed by Dr. Danielle Dess.  Radiates: yes  Aggravates: increased activity Treatments tried: IBU, Tylenol  Dx imaging: 03/06/23 L hip XR  03/08/17 L-spine XR  Pertinent review of systems: No fevers or chills  Relevant historical information: Hypertension and diabetes History lumbar fusion L-spine in the early 2000's or late 90s.  Exam:  BP 136/82   Pulse 61   Ht 5\' 10"  (1.778 m)   Wt 163 lb (73.9 kg)   SpO2 96%   BMI 23.39 kg/m  General: Well Developed, well nourished, and in no acute distress.   MSK: Left hip normal-appearing Tender palpation greater trochanter.  Normal hip motion.  Some pain with flexion and rotation. Strength is reduced to hip abduction and external rotation 4/5 both producing pain.    Lab and Radiology Results  DG HIP UNILAT W OR W/O PELVIS 2-3 VIEWS LEFT Result Date: 03/06/2023 CLINICAL DATA:  Left hip pain for 2-3 months.  No known injury. EXAM: DG HIP (WITH OR WITHOUT PELVIS) 2-3V LEFT COMPARISON:  Abdominopelvic CT 08/22/2019. FINDINGS: The mineralization and alignment are normal. There is no evidence of acute fracture or dislocation. No evidence of femoral head osteonecrosis. Minimal degenerative changes of the hips and sacroiliac joints bilaterally. Patient is status post lower lumbar fusion and ventral hernia repair. There are scattered vascular calcifications. IMPRESSION: No acute osseous findings or significant arthropathic changes. Minimal degenerative changes of the hips and sacroiliac joints. Electronically  Signed   By: Carey Bullocks M.D.   On: 03/06/2023 08:58  I, Clementeen Graham, personally (independently) visualized and performed the interpretation of the images attached in this note.   X-Sevag images lumbar spine obtained today personally and independently interpreted. Well-appearing lumbar fusion hardware from fusion L4-L5.  No acute fractures.  Degenerative changes present predominantly at L2-3. Await formal radiology review   Assessment and Plan: 74 y.o. male with chronic left lateral hip pain with some pain referred to or radiating to the left anterior thigh.  Pain is mostly due to hip abductor tendinopathy and greater trochanteric bursitis..  This would account for the lateral hip pain and weakness.  This will improve with physical therapy.  Referral placed today.  He does have some anterior thigh pain that is due either to the mild hip arthritis seen on x-[place] or possibly lumbar radiculopathy at L2 or L3 which is a possibility based on his x-[place] lumbar spine today.  Plan for physical therapy and recheck in about 6 weeks to 8 weeks.  If not improved consider injection or further advanced imaging. Cyclobenzaprine refilled.  PDMP not reviewed this encounter. Orders Placed This Encounter  Procedures   [place] LIMITED JOINT SPACE STRUCTURES LOW LEFT(NO LINKED CHARGES)    Reason for Exam (SYMPTOM  OR DIAGNOSIS REQUIRED):   left hip pain    Preferred imaging location?:   [facility] Lumbar Spine 2-3 Views    Standing Status:   Future    Number of Occurrences:  1    Expiration Date:   04/08/2023    Reason for Exam (SYMPTOM  OR DIAGNOSIS REQUIRED):   lumbar radiculopathy    Preferred imaging location?:   [facility]   Ambulatory referral to Physical Therapy    Referral Priority:   Routine    Referral Type:   Physical Medicine    Referral Reason:   Specialty Services Required    Requested Specialty:   Physical Therapy    Number of Visits Requested:   1   Meds  ordered this encounter  Medications   cyclobenzaprine (FLEXERIL) 10 MG tablet    Sig: Take 1 tablet (10 mg total) by mouth at bedtime. Take as needed.    Dispense:  30 tablet    Refill:  1     Discussed warning signs or symptoms. Please see discharge instructions. Patient expresses understanding.   The above documentation has been reviewed and is accurate and complete Clementeen Graham, M.D.

## 2023-03-08 ENCOUNTER — Encounter: Payer: Self-pay | Admitting: Rehabilitative and Restorative Service Providers"

## 2023-03-08 ENCOUNTER — Ambulatory Visit (INDEPENDENT_AMBULATORY_CARE_PROVIDER_SITE_OTHER): Payer: Managed Care, Other (non HMO)

## 2023-03-08 ENCOUNTER — Other Ambulatory Visit: Payer: Self-pay

## 2023-03-08 ENCOUNTER — Ambulatory Visit
Payer: Managed Care, Other (non HMO) | Attending: Family Medicine | Admitting: Rehabilitative and Restorative Service Providers"

## 2023-03-08 ENCOUNTER — Ambulatory Visit (INDEPENDENT_AMBULATORY_CARE_PROVIDER_SITE_OTHER): Payer: Managed Care, Other (non HMO) | Admitting: Family Medicine

## 2023-03-08 VITALS — BP 136/82 | HR 61 | Ht 70.0 in | Wt 163.0 lb

## 2023-03-08 DIAGNOSIS — M25552 Pain in left hip: Secondary | ICD-10-CM

## 2023-03-08 DIAGNOSIS — R2689 Other abnormalities of gait and mobility: Secondary | ICD-10-CM | POA: Insufficient documentation

## 2023-03-08 DIAGNOSIS — R252 Cramp and spasm: Secondary | ICD-10-CM | POA: Insufficient documentation

## 2023-03-08 DIAGNOSIS — M6281 Muscle weakness (generalized): Secondary | ICD-10-CM | POA: Diagnosis present

## 2023-03-08 DIAGNOSIS — M5416 Radiculopathy, lumbar region: Secondary | ICD-10-CM | POA: Diagnosis not present

## 2023-03-08 MED ORDER — CYCLOBENZAPRINE HCL 10 MG PO TABS: 10.0000 mg | ORAL_TABLET | Freq: Every day | ORAL | 1 refills | Status: AC

## 2023-03-08 NOTE — Patient Instructions (Addendum)
Thank you for coming in today.   Please get an Xray today before you leave   I've referred you to Physical Therapy.  Let us know if you don't hear from them in one week.   I've refilled your cyclobenzaprine  Check back in 6 weeks

## 2023-03-08 NOTE — Therapy (Signed)
OUTPATIENT PHYSICAL THERAPY LOWER EXTREMITY EVALUATION   Patient Name: Seth Pope MRN: [id number] DOB:[birth year], 73 y.o., male Today's Date: 03/08/2023  END OF SESSION:  PT End of Session - 03/08/23 0946     Visit Number 1    Date for PT Re-Evaluation 05/05/23    Authorization Type [employer]    PT Start Time [phone]    PT Stop Time 1010    PT Time Calculation (min) 33 min    Activity Tolerance Patient tolerated treatment well    Behavior During Therapy [facility] for tasks assessed/performed             Past Medical History:  Diagnosis Date   Anxiety    Arthritis    hands   CAD (coronary artery disease)    a. cath 10/23/13: nonobstructive CAD, nl LV function, rec risk factor modification   Chronic kidney disease    h/o kidney stone - passed stone   Diabetes mellitus    type 2   History of pneumonia    Hyperlipidemia    Hypertension    Liver mass    Sinus bradycardia    a. as low as 36 bpm; b. multiple pauses 2.1-2.3 seconds   Ulcer    Umbilical hernia    Past Surgical History:  Procedure Laterality Date   BACK SURGERY     CERVICAL SPINE SURGERY     COLONOSCOPY  2015   Dr Loreta Ave 3 TAs, tics, hems   INGUINAL HERNIA REPAIR Bilateral 09/16/2019   Procedure: LAPAROSCOPIC BILATERAL INGUINAL HERNIA;  Surgeon: Luretha Murphy, MD;  Location: [facility] ORS;  Service: General;  Laterality: Bilateral;   KNEE SURGERY Right 1970   LEFT HEART CATHETERIZATION WITH CORONARY ANGIOGRAM N/A 10/23/2013   Procedure: LEFT HEART CATHETERIZATION WITH CORONARY ANGIOGRAM;  Surgeon: Peter M Swaziland, MD;  Location: [facility] CATH LAB;  Service: Cardiovascular;  Laterality: N/A;   MICROLARYNGOSCOPY N/A 09/23/2015   Procedure: MICROLARYNGOSCOPY REMOVAL OF LESION;  Surgeon: Serena Colonel, MD;  Location: [facility] OR;  Service: ENT;  Laterality: N/A;   NM MYOCAR PERF WALL MOTION  08/2010   bruce myoview - normal perfusion in all regions, EF 66%, EKG negative for ischemia, no wall motion abnormalities, normal/low risk study                                         ROTATOR CUFF REPAIR Bilateral 1991   x2   UMBILICAL HERNIA REPAIR  1988   Patient Active Problem List   Diagnosis Date Noted   Left hip pain 03/06/2023   Generalized anxiety disorder 03/02/2021   Family history of coronary artery disease in father 10/23/2013   Tobacco abuse 10/17/2013   Neuropathy, diabetic (HCC) 01/31/2012   Hypertension associated with diabetes (HCC)    Dyslipidemia associated with type 2 diabetes mellitus (HCC)    Hyperlipidemia     PCP: Georgina Quint, MD  REFERRING PROVIDER: Rodolph Bong, MD  REFERRING DIAG: [phone] (ICD-10-CM) - Left hip pain  THERAPY DIAG:  Pain in left hip - Plan: PT plan of care cert/re-cert  Muscle weakness (generalized) - Plan: PT plan of care cert/re-cert  Other abnormalities of gait and mobility - Plan: PT plan of care cert/re-cert  Cramp and spasm - Plan: PT plan of care cert/re-cert  Rationale for Evaluation and Treatment: Rehabilitation  ONSET DATE: October 2024  SUBJECTIVE:   SUBJECTIVE STATEMENT: Pain started about  3 months ago.  States that he ignored the pain at first, but it started getting worse, so he went to the MD for further assessment.  Patient went to Dr Denyse Amass this morning and he was referred to PT.  Patient states that he is hoping to avoid surgery.  PERTINENT HISTORY: OA, Anxiety, Diabetes, CAD, HTN, Hx of lumbar fusion with reign cage in low back by Dr Danielle Dess PAIN:  Are you having pain? Yes: NPRS scale: 7-8/10 this morning, but currently 3-4/10 Pain location: Left hip Pain description: aching, sore Aggravating factors: worse in the morning, sometimes wakes him up hurting Relieving factors: sometimes rest and sometimes cold pack, but uncertain  PRECAUTIONS: None  RED FLAGS: None   WEIGHT BEARING RESTRICTIONS: No  FALLS:  Has patient fallen in last 6 months? 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  NEXT MD VISIT: Dr Denyse Amass on 04/19/23  OBJECTIVE:  Note: Objective measures were completed at Evaluation unless otherwise noted.  DIAGNOSTIC FINDINGS:  Left Hip Radiograph on 03/06/23: IMPRESSION: No acute osseous findings or significant arthropathic changes. Minimal degenerative changes of the hips and sacroiliac joints.  PATIENT SURVEYS:  Eval:  LEFS  30 / 80 = 37.5 %  COGNITION: Overall cognitive status: Within functional limits for tasks assessed     SENSATION: Patient denies numbness or tingling  MUSCLE LENGTH: Hamstrings: Tightness bilaterally, but L>R  POSTURE: rounded shoulders  PALPATION: Tender to palpation on left hip  LOWER EXTREMITY ROM:  Eval:  WFL, but some pain with left LE ROM  LOWER EXTREMITY MMT:  Eval: Left hip strength grossly 4/5  FUNCTIONAL TESTS:  Eval: 5 times sit to stand: 17.16 sec Single Leg Stance: Right 13.14  sec, Left 12.19 sec  GAIT: Distance walked: >500 ft Assistive device utilized: None Level of assistance: Complete Independence Comments: Patient states that he gets some increased pain with walking, but overall, feels better when he walks                                                                                                                                TODAY'S TREATMENT  DATE: 03/08/2023  Reviewed HEP Seated hamstring stretch x20 sec bilat Seated scapular retraction x10   PATIENT EDUCATION:  Education details: Issued HEP Person educated: Patient Education method: Explanation, Demonstration, and Handouts Education comprehension: verbalized understanding and returned demonstration  HOME EXERCISE  PROGRAM: Access Code: [email or web address] URL: [email or web address] Date: 03/08/2023 Prepared by: Reather Laurence  Exercises - Seated Scapular Retraction  - 1 x daily - 7 x weekly - 2 sets - 10 reps - Seated Hamstring  Stretch  - 1 x daily - 7 x weekly - 2 reps - 20 sec hold - Seated Sciatic Tensioner  - 1 x daily - 7 x weekly - 2 sets - 10 reps - Sit to Stand Without Arm Support  - 1 x daily - 7 x weekly - 2 sets - 10 reps  ASSESSMENT:  CLINICAL IMPRESSION: Patient is a 73 y.o. male who was seen today for physical therapy evaluation and treatment for left hip pain. Patient's PLOF is independent and to complete all desired tasks and walking without increased pain.  Patient states that he has been having increased pain in his left hip in the past 3 months.  Patient presents with increased left hip pain, muscle weakness, decreased balance, abnormal posture, decreased flexibility, and difficulty performing functional tasks.  Patient would benefit from skilled PT to progress towards goal related activities.  OBJECTIVE IMPAIRMENTS: decreased balance, difficulty walking, decreased strength, increased muscle spasms, impaired flexibility, postural dysfunction, and pain.   ACTIVITY LIMITATIONS: lifting, bending, standing, squatting, and stairs  PARTICIPATION LIMITATIONS: cleaning, community activity, and yard work  PERSONAL FACTORS: Time since onset of injury/illness/exacerbation and 1-2 comorbidities: OA, Lumbar Fusion with reign cage  are also affecting patient's functional outcome.   REHAB POTENTIAL: Good  CLINICAL DECISION MAKING: Stable/uncomplicated  EVALUATION COMPLEXITY: Low   GOALS: Goals reviewed with patient? Yes  SHORT TERM GOALS: Target date: 03/24/2023 Patient will be independent with initial HEP. Baseline: Goal status: INITIAL  2.  Patient will participate in a 6 minute walk test to establish a baseline for distance and pain. Baseline:  Goal status: INITIAL   LONG  TERM GOALS: Target date: 05/05/2023  Patient will be independent with advanced HEP to allow for self-progression after discharge. Baseline:  Goal status: INITIAL  2.  Patient will be able to return to typical walking program and normal pace without increased pain. Baseline: Patient reports increased pain with walking with son and his dog Goal status: INITIAL  3.  Patient will increase Lower Extremity Functional Scale score to at least 55% to demonstrate improved functional mobility. Baseline: 37.5% Goal status: INITIAL  4.  Patient will increase bilateral single leg stance to greater than 30 seconds to decrease risk of falling. Baseline:  Goal status: INITIAL  5.  Patient will increase left hip strength to [facility] to allow him to be able to perform yard work and carpentry without difficulty. Baseline:  Goal status: INITIAL   PLAN:  PT FREQUENCY: 2x/week  PT DURATION: 8 weeks  PLANNED INTERVENTIONS: 97164- PT Re-evaluation, 97110-Therapeutic exercises, 97530- Therapeutic activity, [id number]- Neuromuscular re-education, 97535- Self Care, 85277- Manual therapy, [phone]- Gait training, [phone]- Canalith repositioning, [id number]- Aquatic Therapy, 97014- Electrical stimulation (unattended), [id number]- Electrical stimulation (manual), [id number]- Vasopneumatic device, [id number]- Ultrasound, [id number]- Traction (mechanical), [id number]- Ionotophoresis 4mg /ml Dexamethasone, Patient/Family education, Balance training, Stair training, Taping, Dry Needling, Joint mobilization, Joint manipulation, Spinal manipulation, Spinal mobilization, Vestibular training, Cryotherapy, and Moist heat  PLAN FOR NEXT SESSION: Assess and progress HEP as indicated, strengthening, flexibility, manual/dry needling as indicated    Reather Laurence, PT, DPT 03/08/23, 10:55 AM  [facility] [address], [address] [place], [place] [address] Phone # [phone] Fax [phone]

## 2023-03-10 ENCOUNTER — Encounter: Payer: Self-pay | Admitting: Physical Therapy

## 2023-03-10 ENCOUNTER — Ambulatory Visit: Payer: Managed Care, Other (non HMO) | Admitting: Physical Therapy

## 2023-03-10 DIAGNOSIS — M6281 Muscle weakness (generalized): Secondary | ICD-10-CM

## 2023-03-10 DIAGNOSIS — R2689 Other abnormalities of gait and mobility: Secondary | ICD-10-CM

## 2023-03-10 DIAGNOSIS — M25552 Pain in left hip: Secondary | ICD-10-CM

## 2023-03-10 DIAGNOSIS — R252 Cramp and spasm: Secondary | ICD-10-CM

## 2023-03-10 NOTE — Therapy (Signed)
OUTPATIENT PHYSICAL THERAPY LOWER EXTREMITY TREATMENT   Patient Name: Seth Pope MRN: [id number] DOB:[birth year], 73 y.o., male Today's Date: 03/10/2023  END OF SESSION:  PT End of Session - 03/10/23 0852     Visit Number 2    Date for PT Re-Evaluation 05/05/23    Authorization Type [employer]    PT Start Time 0757    PT Stop Time 0844    PT Time Calculation (min) 47 min    Activity Tolerance Patient tolerated treatment well    Behavior During Therapy [facility] for tasks assessed/performed              Past Medical History:  Diagnosis Date   Anxiety    Arthritis    hands   CAD (coronary artery disease)    a. cath 10/23/13: nonobstructive CAD, nl LV function, rec risk factor modification   Chronic kidney disease    h/o kidney stone - passed stone   Diabetes mellitus    type 2   History of pneumonia    Hyperlipidemia    Hypertension    Liver mass    Sinus bradycardia    a. as low as 36 bpm; b. multiple pauses 2.1-2.3 seconds   Ulcer    Umbilical hernia    Past Surgical History:  Procedure Laterality Date   BACK SURGERY     CERVICAL SPINE SURGERY     COLONOSCOPY  2015   Dr Loreta Ave 3 TAs, tics, hems   INGUINAL HERNIA REPAIR Bilateral 09/16/2019   Procedure: LAPAROSCOPIC BILATERAL INGUINAL HERNIA;  Surgeon: Luretha Murphy, MD;  Location: [facility];  Service: General;  Laterality: Bilateral;   KNEE SURGERY Right 1970   LEFT HEART CATHETERIZATION WITH CORONARY ANGIOGRAM N/A 10/23/2013   Procedure: LEFT HEART CATHETERIZATION WITH CORONARY ANGIOGRAM;  Surgeon: Peter M Swaziland, MD;  Location: [facility] CATH LAB;  Service: Cardiovascular;  Laterality: N/A;   MICROLARYNGOSCOPY N/A 09/23/2015   Procedure: MICROLARYNGOSCOPY REMOVAL OF LESION;  Surgeon: Serena Colonel, MD;  Location: [facility] OR;  Service: ENT;  Laterality: N/A;   NM MYOCAR PERF WALL MOTION  08/2010   bruce myoview - normal perfusion in all regions, EF 66%, EKG negative for ischemia, no wall motion abnormalities, normal/low risk study                                         ROTATOR CUFF REPAIR Bilateral 1991   x2   UMBILICAL HERNIA REPAIR  1988   Patient Active Problem List   Diagnosis Date Noted   Left hip pain 03/06/2023   Generalized anxiety disorder 03/02/2021   Family history of coronary artery disease in father 10/23/2013   Tobacco abuse 10/17/2013   Neuropathy, diabetic (HCC) 01/31/2012   Hypertension associated with diabetes (HCC)    Dyslipidemia associated with type 2 diabetes mellitus (HCC)    Hyperlipidemia     PCP: Georgina Quint, MD  REFERRING PROVIDER: Rodolph Bong, MD  REFERRING DIAG: [phone] (ICD-10-CM) - Left hip pain  THERAPY DIAG:  Pain in left hip  Muscle weakness (generalized)  Other abnormalities of gait and mobility  Cramp and spasm  Rationale for Evaluation and Treatment: Rehabilitation  ONSET DATE: October 2024  SUBJECTIVE:   SUBJECTIVE STATEMENT: Patient reports he is doing okay today. He went on a walk yesterday after doing his exercises he had some increased pain at night. But once he woke up in  the morning and walked for a bit it was okay.  From Eval: Pain started about 3 months ago.  States that he ignored the pain at first, but it started getting worse, so he went to the MD for further assessment.  Patient went to Dr Denyse Amass this morning and he was referred to PT.  Patient states that he is hoping to avoid surgery.  PERTINENT HISTORY: OA, Anxiety, Diabetes, CAD, HTN, Hx of lumbar fusion with reign cage in low back by Dr Danielle Dess  PAIN: 03/10/2023 Are you having pain? Yes: NPRS scale: 4/10 Pain location: Left hip Pain description: aching, sore Aggravating factors: worse in the morning, sometimes wakes him up hurting Relieving factors: sometimes rest and sometimes cold pack, but uncertain  PRECAUTIONS: None  RED FLAGS: None   WEIGHT BEARING RESTRICTIONS: No  FALLS:  Has patient fallen in last 6 months? 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  NEXT MD VISIT: Dr Denyse Amass on 04/19/23  OBJECTIVE:  Note: Objective measures were completed at Evaluation unless otherwise noted.  DIAGNOSTIC FINDINGS:  Left Hip Radiograph on 03/06/23: IMPRESSION: No acute osseous findings or significant arthropathic changes. Minimal degenerative changes of the hips and sacroiliac joints.  PATIENT SURVEYS:  Eval:  LEFS  30 / 80 = 37.5 %  COGNITION: Overall cognitive status: Within functional limits for tasks assessed     SENSATION: Patient denies numbness or tingling  MUSCLE LENGTH: Hamstrings: Tightness bilaterally, but L>R  POSTURE: rounded shoulders  PALPATION: Tender to palpation on left hip  LOWER EXTREMITY ROM:  Eval:  WFL, but some pain with left LE ROM  LOWER EXTREMITY MMT:  Eval: Left hip strength grossly 4/5  FUNCTIONAL TESTS:  Eval: 5 times sit to stand: 17.16 sec Single Leg Stance: Right 13.14  sec, Left 12.19 sec  GAIT: Distance walked: >500 ft Assistive device utilized: None Level of assistance: Complete Independence Comments: Patient states that he gets some increased pain with walking, but overall, feels better when he walks                                                                                                                                TODAY'S TREATMENT  DATE: 03/10/2023  NuStep Level 2 3 mins- PT present to discuss status (increased hip some) Seated scapular retraction x 10 Seated hamstring stretch 2 x 30 sec left  Sciatic nerve tensioner x 10 Sit to stand  x 10 Seated piriformis stretch (Rt leg extended) 2 x 30sec Supine flexion stretch 2 x 30 sec  B  Hip Internal/ External stretch  Supine glute cross  body stretch 2 x 30 sec Lt  Hip flexion with unilateral 5# KB hold Side stepping with blue loop x 4     DATE: 03/08/2023  Reviewed HEP Seated hamstring stretch x20 sec bilat Seated scapular retraction x10   PATIENT EDUCATION:  Education details: Issued HEP Person educated: Patient Education method: Explanation, Demonstration, and Handouts Education comprehension: verbalized understanding and returned demonstration  HOME EXERCISE PROGRAM: Access Code: [email or web address] URL: [email or web address] Date: 03/10/2023 Prepared by: Claude Manges  Exercises - Seated Scapular Retraction  - 1 x daily - 7 x weekly - 2 sets - 10 reps - Seated Hamstring Stretch  - 1 x daily - 7 x weekly - 2 reps - 20 sec hold - Seated Sciatic Tensioner  - 1 x daily - 7 x weekly - 2 sets - 10 reps - Sit to Stand Without Arm Support  - 1 x daily - 7 x weekly - 2 sets - 10 reps - Hip Flexion Stretch  - 1 x daily - 7 x weekly - 2 sets - 30 hold - Supine Hip Internal and External Rotation  - 1 x daily - 7 x weekly - 1 sets - 10 reps - Seated Piriformis Stretch with Trunk Bend  - 1 x daily - 7 x weekly - 2 sets - 30 hold - Supine Gluteus Stretch  - 1 x daily - 7 x weekly - 2 sets - 30 hold  ASSESSMENT:  CLINICAL IMPRESSION: Lattie presents to therapy with mild Lt hip pain. He has been compliant with HEP and required minimal verbal cues for form correction. Updated patient's HEP to include stretches he can perform in the morning before he gets out of bed. Educated patient on a exercise regimen he can perform with stretches, strengthening, and walking, Patient required verbal and visual cues for correct exercise performance. Benford is motivated and wants to get better. Patient will benefit from skilled PT to address the below impairments and improve overall function.   OBJECTIVE IMPAIRMENTS: decreased balance, difficulty walking, decreased strength, increased  muscle spasms, impaired flexibility, postural dysfunction, and pain.   ACTIVITY LIMITATIONS: lifting, bending, standing, squatting, and stairs  PARTICIPATION LIMITATIONS: cleaning, community activity, and yard work  PERSONAL FACTORS: Time since onset of injury/illness/exacerbation and 1-2 comorbidities: OA, Lumbar Fusion with reign cage  are also affecting patient's functional outcome.   REHAB POTENTIAL: Good  CLINICAL DECISION MAKING: Stable/uncomplicated  EVALUATION COMPLEXITY: Low   GOALS: Goals reviewed with patient? Yes  SHORT TERM GOALS: Target date: 03/24/2023 Patient will be independent with initial HEP. Baseline: Goal status: INITIAL  2.  Patient will participate in a 6 minute walk test to establish a baseline for distance and pain. Baseline:  Goal status: INITIAL   LONG TERM GOALS: Target date: 05/05/2023  Patient will be independent with advanced HEP to allow for self-progression after discharge. Baseline:  Goal status: INITIAL  2.  Patient will be able to return to typical walking program and normal pace without increased pain. Baseline: Patient reports increased pain with walking with son and his dog Goal status: INITIAL  3.  Patient will increase Lower Extremity Functional Scale score to at least 55% to demonstrate improved functional mobility. Baseline: 37.5% Goal status: INITIAL  4.  Patient will increase bilateral single leg stance to greater than 30 seconds to decrease risk of falling. Baseline:  Goal status: INITIAL  5.  Patient will increase left hip strength to [facility] to allow him to be able to perform yard work and carpentry  without difficulty. Baseline:  Goal status: INITIAL   PLAN:  PT FREQUENCY: 2x/week  PT DURATION: 8 weeks  PLANNED INTERVENTIONS: 97164- PT Re-evaluation, 97110-Therapeutic exercises, 97530- Therapeutic activity, [id number]- Neuromuscular re-education, 97535- Self Care, [id number]- Manual therapy, [phone]- Gait training, [phone]- Canalith  repositioning, [id number]- Aquatic Therapy, 97014- Electrical stimulation (unattended), [id number]- Electrical stimulation (manual), [id number]- Vasopneumatic device, [id number]- Ultrasound, [id number]- Traction (mechanical), [id number]- Ionotophoresis 4mg /ml Dexamethasone, Patient/Family education, Balance training, Stair training, Taping, Dry Needling, Joint mobilization, Joint manipulation, Spinal manipulation, Spinal mobilization, Vestibular training, Cryotherapy, and Moist heat  PLAN FOR NEXT SESSION: Assess new HEP; continue hip flexibility & strengthening ; manual/dry needling as indicated     Claude Manges, PT 03/10/23 8:52 AM [facility] [address] [place], [place] [address] Phone # [phone] Fax [phone]

## 2023-03-13 ENCOUNTER — Encounter: Payer: Self-pay | Admitting: Family Medicine

## 2023-03-13 NOTE — Progress Notes (Signed)
Low back x-Anyelo shows a fusion at L4-5 that looks okay to radiology.  You do have arthritis above the level of the fusion at L2-3 and a little bit at L5-S1 below the level of the fusion.

## 2023-03-14 ENCOUNTER — Encounter: Payer: Self-pay | Admitting: Physical Therapy

## 2023-03-14 ENCOUNTER — Ambulatory Visit: Payer: Managed Care, Other (non HMO) | Admitting: Physical Therapy

## 2023-03-14 DIAGNOSIS — M25552 Pain in left hip: Secondary | ICD-10-CM

## 2023-03-14 DIAGNOSIS — R252 Cramp and spasm: Secondary | ICD-10-CM

## 2023-03-14 DIAGNOSIS — R2689 Other abnormalities of gait and mobility: Secondary | ICD-10-CM

## 2023-03-14 DIAGNOSIS — M6281 Muscle weakness (generalized): Secondary | ICD-10-CM

## 2023-03-14 NOTE — Therapy (Signed)
OUTPATIENT PHYSICAL THERAPY LOWER EXTREMITY TREATMENT   Patient Name: Seth Pope MRN: [id number] DOB:1950/09/13, 73 y.o., male Today's Date: 03/14/2023  END OF SESSION:  PT End of Session - 03/14/23 0844     Visit Number 3    Date for PT Re-Evaluation 05/05/23    Authorization Type [employer]    PT Start Time 0758    PT Stop Time 0843    PT Time Calculation (min) 45 min    Activity Tolerance Patient tolerated treatment well    Behavior During Therapy [facility] for tasks assessed/performed               Past Medical History:  Diagnosis Date   Anxiety    Arthritis    hands   CAD (coronary artery disease)    a. cath 10/23/13: nonobstructive CAD, nl LV function, rec risk factor modification   Chronic kidney disease    h/o kidney stone - passed stone   Diabetes mellitus    type 2   History of pneumonia    Hyperlipidemia    Hypertension    Liver mass    Sinus bradycardia    a. as low as 36 bpm; b. multiple pauses 2.1-2.3 seconds   Ulcer    Umbilical hernia    Past Surgical History:  Procedure Laterality Date   BACK SURGERY     CERVICAL SPINE SURGERY     COLONOSCOPY  2015   Dr Loreta Ave 3 TAs, tics, hems   INGUINAL HERNIA REPAIR Bilateral 09/16/2019   Procedure: LAPAROSCOPIC BILATERAL INGUINAL HERNIA;  Surgeon: Luretha Murphy, MD;  Location: [facility];  Service: General;  Laterality: Bilateral;   KNEE SURGERY Right 1970   LEFT HEART CATHETERIZATION WITH CORONARY ANGIOGRAM N/A 10/23/2013   Procedure: LEFT HEART CATHETERIZATION WITH CORONARY ANGIOGRAM;  Surgeon: Peter M Swaziland, MD;  Location: [facility] CATH LAB;  Service: Cardiovascular;  Laterality: N/A;   MICROLARYNGOSCOPY N/A 09/23/2015   Procedure: MICROLARYNGOSCOPY REMOVAL OF LESION;  Surgeon: Serena Colonel, MD;  Location: [facility] OR;  Service: ENT;  Laterality: N/A;   NM MYOCAR PERF WALL MOTION  08/2010   bruce myoview - normal perfusion in all regions, EF 66%, EKG negative for ischemia, no wall motion abnormalities, normal/low risk study                                         ROTATOR CUFF REPAIR Bilateral 1991   x2   UMBILICAL HERNIA REPAIR  1988   Patient Active Problem List   Diagnosis Date Noted   Left hip pain 03/06/2023   Generalized anxiety disorder 03/02/2021   Family history of coronary artery disease in father 10/23/2013   Tobacco abuse 10/17/2013   Neuropathy, diabetic (HCC) 01/31/2012   Hypertension associated with diabetes (HCC)    Dyslipidemia associated with type 2 diabetes mellitus (HCC)    Hyperlipidemia     PCP: Georgina Quint, MD  REFERRING PROVIDER: Rodolph Bong, MD  REFERRING DIAG: [phone] (ICD-10-CM) - Left hip pain  THERAPY DIAG:  Pain in left hip  Muscle weakness (generalized)  Other abnormalities of gait and mobility  Cramp and spasm  Rationale for Evaluation and Treatment: Rehabilitation  ONSET DATE: October 2024  SUBJECTIVE:   SUBJECTIVE STATEMENT: Patient reports his hip is semi- okay. He woke up this morning at 4 am with increased pain. He has been doing his exercises and he had increased soreness  after doing all of them.   From Eval: Pain started about 3 months ago.  States that he ignored the pain at first, but it started getting worse, so he went to the MD for further assessment.  Patient went to Dr Denyse Amass this morning and he was referred to PT.  Patient states that he is hoping to avoid surgery.  PERTINENT HISTORY: OA, Anxiety, Diabetes, CAD, HTN, Hx of lumbar fusion with reign cage in low back by Dr Danielle Dess  PAIN: 03/14/2023 Are you having pain? Yes: NPRS scale: 4/10 Pain location: Left hip Pain description: aching, sore Aggravating factors: worse in the morning, sometimes wakes him up hurting Relieving factors: sometimes rest and sometimes cold pack, but uncertain  PRECAUTIONS: None  RED FLAGS: None   WEIGHT BEARING RESTRICTIONS: No  FALLS:  Has patient fallen in last 6 months? 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  NEXT MD VISIT: Dr Denyse Amass on 04/19/23  OBJECTIVE:  Note: Objective measures were completed at Evaluation unless otherwise noted.  DIAGNOSTIC FINDINGS:  Left Hip Radiograph on 03/06/23: IMPRESSION: No acute osseous findings or significant arthropathic changes. Minimal degenerative changes of the hips and sacroiliac joints.  PATIENT SURVEYS:  Eval:  LEFS  30 / 80 = 37.5 %  COGNITION: Overall cognitive status: Within functional limits for tasks assessed     SENSATION: Patient denies numbness or tingling  MUSCLE LENGTH: Hamstrings: Tightness bilaterally, but L>R  POSTURE: rounded shoulders  PALPATION: Tender to palpation on left hip  LOWER EXTREMITY ROM:  Eval:  WFL, but some pain with left LE ROM  LOWER EXTREMITY MMT:  Eval: Left hip strength grossly 4/5  FUNCTIONAL TESTS:  Eval: 5 times sit to stand: 17.16 sec Single Leg Stance: Right 13.14  sec, Left 12.19 sec  GAIT: Distance walked: >500 ft Assistive device utilized: None Level of assistance: Complete Independence Comments: Patient states that he gets some increased pain with walking, but overall, feels better when he walks                                                                                                                                TODAY'S TREATMENT  DATE: 03/14/2023  NuStep Level 5 7 mins- PT present to discuss status  Hamstring stretch on 2nd step Lt 2 x 30 sec Hip flexor stretch on Lt 2 x 30 sec Sit to stand  x 10 hold 7# DB Side stepping with red TB loop x 4  Hip extension with red TB loop 2 x 10 bilateral  Step ups on stair holding 5# DB x 20 each  leg Supine SLR 2 x 10 bilateral  Bridges 2 x 10   DATE: 03/10/2023  NuStep Level 2 3 mins- PT present to discuss status (increased hip pain some) Seated scapular retraction x 10 Seated hamstring stretch 2 x 30 sec left  Sciatic nerve tensioner x 10 Sit to stand  x 10 Seated piriformis stretch (Rt leg extended) 2 x 30sec Supine flexion stretch 2 x 30 sec B  Hip Internal/ External stretch  Supine glute cross body stretch 2 x 30 sec Lt  Hip flexion with unilateral 5# KB hold Side stepping with blue loop x 4     DATE: 03/08/2023  Reviewed HEP Seated hamstring stretch x20 sec bilat Seated scapular retraction x10   PATIENT EDUCATION:  Education details: Issued HEP Person educated: Patient Education method: Explanation, Demonstration, and Handouts Education comprehension: verbalized understanding and returned demonstration  HOME EXERCISE PROGRAM: Access Code: [email or web address] URL: [email or web address] Date: 03/14/2023 Prepared by: Claude Manges  Exercises - Seated Scapular Retraction  - 1 x daily - 7 x weekly - 2 sets - 10 reps - Seated Hamstring Stretch  - 1 x daily - 7 x weekly - 2 reps - 20 sec hold - Seated Sciatic Tensioner  - 1 x daily - 7 x weekly - 2 sets - 10 reps - Sit to Stand Without Arm Support  - 1 x daily - 7 x weekly - 2 sets - 10 reps - Hip Flexion Stretch  - 1 x daily - 7 x weekly - 2 sets - 30 hold - Supine Hip Internal and External Rotation  - 1 x daily - 7 x weekly - 1 sets - 10 reps - Seated Piriformis Stretch with Trunk Bend  - 1 x daily - 7 x weekly - 2 sets - 30 hold - Supine Gluteus Stretch  - 1 x daily - 7 x weekly - 2 sets - 30 hold - Side Stepping with Resistance at Thighs  - 1 x daily - 7 x weekly - 3 sets - Hip Extension with Resistance Loop  - 1 x daily - 7 x weekly - 2 sets - 10 reps - Small Range Straight Leg Raise  - 1 x daily - 7 x weekly - 2 sets - 10 reps - Supine Bridge  - 1 x daily - 7 x weekly - 2 sets - 10  reps  ASSESSMENT:  CLINICAL IMPRESSION: Lavonte presents to therapy with mild Lt hip pain. He has been compliant with HEP. Educated patient on energy conservation and correct exercise intensity. Updated HEP to include progressions of hip strengthening exercises. Discussed decreasing therapy frequency to once a week since patient has a high co pay and he is adherent to HEP. Patient verbalized agreement. Patient required verbal and visual cues for correct exercise performance. Patient will benefit from skilled PT to address the below impairments and improve overall function.    OBJECTIVE IMPAIRMENTS: decreased balance, difficulty walking, decreased strength, increased muscle spasms, impaired flexibility, postural dysfunction, and pain.   ACTIVITY LIMITATIONS: lifting, bending, standing, squatting, and stairs  PARTICIPATION LIMITATIONS: cleaning, community activity, and yard work  PERSONAL FACTORS: Time since onset of injury/illness/exacerbation and 1-2 comorbidities: OA, Lumbar Fusion with reign cage  are also affecting patient's functional outcome.   REHAB POTENTIAL: Good  CLINICAL DECISION MAKING: Stable/uncomplicated  EVALUATION COMPLEXITY: Low   GOALS: Goals reviewed with patient? Yes  SHORT TERM GOALS: Target date: 03/24/2023 Patient will be independent with initial HEP. Baseline: Goal status: MET 03/14/2023  2.  Patient will participate in a 6 minute walk test to establish a baseline for distance and pain. Baseline:  Goal status: INITIAL   LONG TERM GOALS: Target date: 05/05/2023  Patient will be independent with advanced HEP to allow for self-progression after discharge. Baseline:  Goal status: INITIAL  2.  Patient will be able to return to typical walking program and normal pace without increased pain. Baseline: Patient reports increased pain with walking with son and his dog Goal status: INITIAL  3.  Patient will increase Lower Extremity Functional Scale score to at  least 55% to demonstrate improved functional mobility. Baseline: 37.5% Goal status: INITIAL  4.  Patient will increase bilateral single leg stance to greater than 30 seconds to decrease risk of falling. Baseline:  Goal status: INITIAL  5.  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 Baseline:  Goal status: INITIAL   PLAN:  PT FREQUENCY: 2x/week  PT DURATION: 8 weeks  PLANNED INTERVENTIONS: 97164- PT Re-evaluation, 97110-Therapeutic exercises, 97530- Therapeutic activity, [id number]- Neuromuscular re-education, 97535- Self Care, 16109- Manual therapy, [id number]- Gait training, [phone]- Canalith repositioning, [id number]- Aquatic Therapy, 97014- Electrical stimulation (unattended), [phone]- Electrical stimulation (manual), [id number]- Vasopneumatic device, [id number]- Ultrasound, [id number]- Traction (mechanical), [id number]- Ionotophoresis 4mg /ml Dexamethasone, Patient/Family education, Balance training, Stair training, Taping, Dry Needling, Joint mobilization, Joint manipulation, Spinal manipulation, Spinal mobilization, Vestibular training, Cryotherapy, and Moist heat  PLAN FOR NEXT SESSION: decrease to once a week after two weeks if patient is doing well ; Assess new HEP; continue hip flexibility & strengthening ; manual/dry needling as indicated     Claude Manges, PT 03/14/23 8:44 AM [facility] [address] [place], [place] [address] Phone # [phone] Fax [phone]

## 2023-03-16 ENCOUNTER — Ambulatory Visit: Payer: Managed Care, Other (non HMO) | Admitting: Physical Therapy

## 2023-03-21 ENCOUNTER — Ambulatory Visit: Payer: Managed Care, Other (non HMO) | Attending: Family Medicine | Admitting: Physical Therapy

## 2023-03-21 ENCOUNTER — Encounter: Payer: Self-pay | Admitting: Physical Therapy

## 2023-03-21 DIAGNOSIS — R252 Cramp and spasm: Secondary | ICD-10-CM | POA: Insufficient documentation

## 2023-03-21 DIAGNOSIS — M25552 Pain in left hip: Secondary | ICD-10-CM | POA: Diagnosis present

## 2023-03-21 DIAGNOSIS — R2689 Other abnormalities of gait and mobility: Secondary | ICD-10-CM | POA: Diagnosis present

## 2023-03-21 DIAGNOSIS — M6281 Muscle weakness (generalized): Secondary | ICD-10-CM | POA: Insufficient documentation

## 2023-03-21 NOTE — Therapy (Signed)
 OUTPATIENT PHYSICAL THERAPY LOWER EXTREMITY TREATMENT   Patient Name: Seth Pope MRN: [id number] DOB:Aug 01, 1950, 73 y.o., male Today's Date: 03/21/2023  END OF SESSION:  PT End of Session - 03/21/23 0939     Visit Number 4    Date for PT Re-Evaluation 05/05/23    Authorization Type [employer]    PT Start Time 0848    PT Stop Time 0934    PT Time Calculation (min) 46 min    Activity Tolerance Patient tolerated treatment well    Behavior During Therapy [facility] for tasks assessed/performed                Past Medical History:  Diagnosis Date   Anxiety    Arthritis    hands   CAD (coronary artery disease)    a. cath 10/23/13: nonobstructive CAD, nl LV function, rec risk factor modification   Chronic kidney disease    h/o kidney stone - passed stone   Diabetes mellitus    type 2   History of pneumonia    Hyperlipidemia    Hypertension    Liver mass    Sinus bradycardia    a. as low as 36 bpm; b. multiple pauses 2.1-2.3 seconds   Ulcer    Umbilical hernia    Past Surgical History:  Procedure Laterality Date   BACK SURGERY     CERVICAL SPINE SURGERY     COLONOSCOPY  2015   Dr Kristie 3 TAs, tics, hems   INGUINAL HERNIA REPAIR Bilateral 09/16/2019   Procedure: LAPAROSCOPIC BILATERAL INGUINAL HERNIA;  Surgeon: Gladis Cough, MD;  Location: [facility];  Service: General;  Laterality: Bilateral;   KNEE SURGERY Right 1970   LEFT HEART CATHETERIZATION WITH CORONARY ANGIOGRAM N/A 10/23/2013   Procedure: LEFT HEART CATHETERIZATION WITH CORONARY ANGIOGRAM;  Surgeon: Peter M Jordan, MD;  Location: [facility] CATH LAB;  Service: Cardiovascular;  Laterality: N/A;   MICROLARYNGOSCOPY N/A 09/23/2015   Procedure: MICROLARYNGOSCOPY REMOVAL OF LESION;  Surgeon: Ida Loader, MD;  Location: [facility] OR;  Service: ENT;  Laterality: N/A;   NM MYOCAR PERF WALL MOTION  08/2010   bruce myoview - normal perfusion in all regions, EF 66%, EKG negative for ischemia, no wall motion abnormalities, normal/low risk study                                         ROTATOR CUFF REPAIR Bilateral 1991   x2   UMBILICAL HERNIA REPAIR  1988   Patient Active Problem List   Diagnosis Date Noted   Left hip pain 03/06/2023   Generalized anxiety disorder 03/02/2021   Family history of coronary artery disease in father 10/23/2013   Tobacco abuse 10/17/2013   Neuropathy, diabetic (HCC) 01/31/2012   Hypertension associated with diabetes (HCC)    Dyslipidemia associated with type 2 diabetes mellitus (HCC)    Hyperlipidemia     PCP: Purcell Emil Schanz, MD  REFERRING PROVIDER: Joane Artist RAMAN, MD  REFERRING DIAG: [phone] (ICD-10-CM) - Left hip pain  THERAPY DIAG:  Pain in left hip  Muscle weakness (generalized)  Other abnormalities of gait and mobility  Cramp and spasm  Rationale for Evaluation and Treatment: Rehabilitation  ONSET DATE: October 2024  SUBJECTIVE:   SUBJECTIVE STATEMENT: Patient reports he did a lot of walking this weekend at the race in [place]. His hip did not bother him as much as he thought it  would. He woke up at 4am yesterday & today with pain. But overall his hip is doing good.  From Eval: Pain started about 3 months ago.  States that he ignored the pain at first, but it started getting worse, so he went to the MD for further assessment.  Patient went to Dr Joane this morning and he was referred to PT.  Patient states that he is hoping to avoid surgery.  PERTINENT HISTORY: OA, Anxiety, Diabetes, CAD, HTN, Hx of lumbar fusion with reign cage in low back by Dr Colon  PAIN: 03/21/2023 Are you having pain? Yes: NPRS scale: 2-3/10 Pain location: Left hip Pain description: aching, sore Aggravating factors: worse in the morning, sometimes wakes him up hurting Relieving factors: sometimes rest and sometimes cold pack, but uncertain  PRECAUTIONS: None  RED FLAGS: None   WEIGHT BEARING RESTRICTIONS: No  FALLS:  Has patient fallen in last 6 months? 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  NEXT MD VISIT: Dr Joane on 04/19/23  OBJECTIVE:  Note: Objective measures were completed at Evaluation unless otherwise noted.  DIAGNOSTIC FINDINGS:  Left Hip Radiograph on 03/06/23: IMPRESSION: No acute osseous findings or significant arthropathic changes. Minimal degenerative changes of the hips and sacroiliac joints.  PATIENT SURVEYS:  Eval:  LEFS  30 / 80 = 37.5 %  COGNITION: Overall cognitive status: Within functional limits for tasks assessed     SENSATION: Patient denies numbness or tingling  MUSCLE LENGTH: Hamstrings: Tightness bilaterally, but L>R  POSTURE: rounded shoulders  PALPATION: Tender to palpation on left hip  LOWER EXTREMITY ROM:  Eval:  WFL, but some pain with left LE ROM  LOWER EXTREMITY MMT:  Eval: Left hip strength grossly 4/5  FUNCTIONAL TESTS:  Eval: 5 times sit to stand: 17.16 sec Single Leg Stance: Right 13.14  sec, Left 12.19 sec  GAIT: Distance walked: >500 ft Assistive device utilized: None Level of assistance: Complete Independence Comments: Patient states that he gets some increased pain with walking, but overall, feels better when he walks                                                                                                                                TODAY'S TREATMENT  DATE: 03/21/2023  NuStep Level 3 9 mins- PT present to discuss status  Standing hip extension with yellow loop 2 x 10 bilateral  Hip flexion with unilateral 5# KB hold x 20 Supine SLR 2 x 10 bilateral  Bridges 2 x 10 Sit to stand holding 5# db x 10 Leg Press 60# 2 x 10 Hip  matrix  (hip abduction) 25# x 10 bilateral    DATE: 03/14/2023  NuStep Level 5 7 mins- PT present to discuss status  Hamstring stretch on 2nd step Lt 2 x 30 sec Hip flexor stretch on Lt 2 x 30 sec Sit to stand  x 10 hold 7# DB Side stepping with red TB loop x 4  Hip extension with red TB loop 2 x 10 bilateral  Step ups on stair holding 5# DB x 20 each leg Supine SLR 2 x 10 bilateral  Bridges 2 x 10   DATE: 03/10/2023  NuStep Level 2 3 mins- PT present to discuss status (increased hip pain some) Seated scapular retraction x 10 Seated hamstring stretch 2 x 30 sec left  Sciatic nerve tensioner x 10 Sit to stand  x 10 Seated piriformis stretch (Rt leg extended) 2 x 30sec Supine flexion stretch 2 x 30 sec B  Hip Internal/ External stretch  Supine glute cross body stretch 2 x 30 sec Lt  Hip flexion with unilateral 5# KB hold Side stepping with blue loop x 4     PATIENT EDUCATION:  Education details: Issued HEP Person educated: Patient Education method: Explanation, Facilities Manager, and Handouts Education comprehension: verbalized understanding and returned demonstration  HOME EXERCISE PROGRAM: Access Code: [email or web address] URL: [email or web address] Date: 03/14/2023 Prepared by: Kristeen Sar  Exercises - Seated Scapular Retraction  - 1 x daily - 7 x weekly - 2 sets - 10 reps - Seated Hamstring Stretch  - 1 x daily - 7 x weekly - 2 reps - 20 sec hold - Seated Sciatic Tensioner  - 1 x daily - 7 x weekly - 2 sets - 10 reps - Sit to Stand Without Arm Support  - 1 x daily - 7 x weekly - 2 sets - 10 reps - Hip Flexion Stretch  - 1 x daily - 7 x weekly - 2 sets - 30 hold - Supine Hip Internal and External Rotation  - 1 x daily - 7 x weekly - 1 sets - 10 reps - Seated Piriformis Stretch with Trunk Bend  - 1 x daily - 7 x weekly - 2 sets - 30 hold - Supine Gluteus Stretch  - 1 x daily - 7 x weekly - 2 sets - 30 hold - Side Stepping with Resistance at Thighs  - 1 x daily - 7 x weekly - 3  sets - Hip Extension with Resistance Loop  - 1 x daily - 7 x weekly - 2 sets - 10 reps - Small Range Straight Leg Raise  - 1 x daily - 7 x weekly - 2 sets - 10 reps - Supine Bridge  - 1 x daily - 7 x weekly - 2 sets - 10 reps  ASSESSMENT:  CLINICAL IMPRESSION: Pardeep presents to therapy with minimal hip pain. He did a lot of walking over the weekend and he did not experience intense hip pain as he was expecting. Overall, he has been tolerating physical therapy well. He verbalize feeling 40% better since starting therapy. He is motivated and diligent with his HEP. We decreased his frequency to once a week since he is doing well and compliant with HEP. Patient required verbal cues for upright posture and proper form for bridges.     OBJECTIVE IMPAIRMENTS: decreased balance, difficulty walking, decreased strength, increased muscle spasms, impaired flexibility, postural dysfunction, and pain.   ACTIVITY LIMITATIONS: lifting, bending, standing, squatting, and stairs  PARTICIPATION LIMITATIONS: cleaning, community activity, and yard work  PERSONAL FACTORS: Time since onset of injury/illness/exacerbation and 1-2 comorbidities: OA, Lumbar Fusion with reign cage  are also affecting patient's functional outcome.   REHAB POTENTIAL: Good  CLINICAL DECISION MAKING: Stable/uncomplicated  EVALUATION COMPLEXITY: Low   GOALS: Goals reviewed with patient? Yes  SHORT TERM GOALS: Target date: 03/24/2023 Patient will be independent with initial HEP. Baseline: Goal status: MET 03/14/2023  2.  Patient will participate in a 6 minute walk test to establish a baseline for distance and pain. Baseline:  Goal status: INITIAL   LONG TERM GOALS: Target date: 05/05/2023  Patient will be independent with advanced HEP to allow for self-progression after discharge. Baseline:  Goal status: INITIAL  2.  Patient will be able to return to typical walking program and normal pace without increased pain. Baseline:  Patient reports increased pain with walking with son and his dog Goal status: INITIAL  3.  Patient will increase Lower Extremity Functional Scale score to at least 55% to demonstrate improved functional mobility. Baseline: 37.5% Goal status: INITIAL  4.  Patient will increase bilateral single leg stance to greater than 30 seconds to decrease risk of falling. Baseline:  Goal status: INITIAL  5.  Patient will increase left hip strength to [facility] to allow him to be able to perform yard work and carpentry without difficulty. Baseline:  Goal status: INITIAL   PLAN:  PT FREQUENCY: 2x/week  PT DURATION: 8 weeks  PLANNED INTERVENTIONS: 97164- PT Re-evaluation, 97110-Therapeutic exercises, 97530- Therapeutic activity, 97112- Neuromuscular re-education, 97535- Self Care, 02859- Manual therapy, [phone]- Gait training, [phone]- Canalith repositioning, [id number]- Aquatic Therapy, 97014- Electrical stimulation (unattended), [id number]- Electrical stimulation (manual), [id number]- Vasopneumatic device, [id number]- Ultrasound, [id number]- Traction (mechanical), [id number]- Ionotophoresis 4mg /ml Dexamethasone , Patient/Family education, Balance training, Stair training, Taping, Dry Needling, Joint mobilization, Joint manipulation, Spinal manipulation, Spinal mobilization, Vestibular training, Cryotherapy, and Moist heat  PLAN FOR NEXT SESSION: 5th visit medical review? ; continue hip strengthening and flexibility; hip matrix; manual/ DN as indicated    Kristeen Sar, PT 03/21/23 9:39 AM [facility] [address] [place], [place] [address] Phone # [phone] Fax [phone]

## 2023-03-23 ENCOUNTER — Encounter: Payer: Managed Care, Other (non HMO) | Admitting: Physical Therapy

## 2023-03-28 ENCOUNTER — Encounter: Payer: Managed Care, Other (non HMO) | Admitting: Rehabilitative and Restorative Service Providers"

## 2023-03-30 ENCOUNTER — Encounter: Payer: Self-pay | Admitting: Rehabilitative and Restorative Service Providers"

## 2023-03-30 ENCOUNTER — Ambulatory Visit (INDEPENDENT_AMBULATORY_CARE_PROVIDER_SITE_OTHER): Payer: Managed Care, Other (non HMO)

## 2023-03-30 ENCOUNTER — Ambulatory Visit: Payer: Managed Care, Other (non HMO) | Admitting: Rehabilitative and Restorative Service Providers"

## 2023-03-30 DIAGNOSIS — R252 Cramp and spasm: Secondary | ICD-10-CM

## 2023-03-30 DIAGNOSIS — M6281 Muscle weakness (generalized): Secondary | ICD-10-CM

## 2023-03-30 DIAGNOSIS — M25552 Pain in left hip: Secondary | ICD-10-CM

## 2023-03-30 DIAGNOSIS — R2689 Other abnormalities of gait and mobility: Secondary | ICD-10-CM

## 2023-03-30 DIAGNOSIS — Z23 Encounter for immunization: Secondary | ICD-10-CM

## 2023-03-30 NOTE — Therapy (Signed)
OUTPATIENT PHYSICAL THERAPY TREATMENT NOTE   Patient Name: Seth Pope MRN: [id number] DOB:[birth year], 73 y.o., male Today's Date: 03/30/2023  END OF SESSION:  PT End of Session - 03/30/23 0938     Visit Number 5    Date for PT Re-Evaluation 05/05/23    Authorization Type [employer]    PT Start Time [phone]    PT Stop Time 1015    PT Time Calculation (min) 41 min    Activity Tolerance Patient tolerated treatment well    Behavior During Therapy [facility] for tasks assessed/performed                Past Medical History:  Diagnosis Date   Anxiety    Arthritis    hands   CAD (coronary artery disease)    a. cath 10/23/13: nonobstructive CAD, nl LV function, rec risk factor modification   Chronic kidney disease    h/o kidney stone - passed stone   Diabetes mellitus    type 2   History of pneumonia    Hyperlipidemia    Hypertension    Liver mass    Sinus bradycardia    a. as low as 36 bpm; b. multiple pauses 2.1-2.3 seconds   Ulcer    Umbilical hernia    Past Surgical History:  Procedure Laterality Date   BACK SURGERY     CERVICAL SPINE SURGERY     COLONOSCOPY  2015   Dr Loreta Ave 3 TAs, tics, hems   INGUINAL HERNIA REPAIR Bilateral 09/16/2019   Procedure: LAPAROSCOPIC BILATERAL INGUINAL HERNIA;  Surgeon: Luretha Murphy, MD;  Location: [facility];  Service: General;  Laterality: Bilateral;   KNEE SURGERY Right 1970   LEFT HEART CATHETERIZATION WITH CORONARY ANGIOGRAM N/A 10/23/2013   Procedure: LEFT HEART CATHETERIZATION WITH CORONARY ANGIOGRAM;  Surgeon: Peter M Swaziland, MD;  Location: [facility] CATH LAB;  Service: Cardiovascular;  Laterality: N/A;   MICROLARYNGOSCOPY N/A 09/23/2015   Procedure: MICROLARYNGOSCOPY REMOVAL OF LESION;  Surgeon: Serena Colonel, MD;  Location: [facility] OR;  Service: ENT;  Laterality: N/A;   NM MYOCAR PERF WALL MOTION  08/2010   bruce myoview - normal perfusion in all regions, EF 66%, EKG negative for ischemia, no wall motion abnormalities, normal/low risk study                                         ROTATOR CUFF REPAIR Bilateral 1991   x2   UMBILICAL HERNIA REPAIR  1988   Patient Active Problem List   Diagnosis Date Noted   Left hip pain 03/06/2023   Generalized anxiety disorder 03/02/2021   Family history of coronary artery disease in father 10/23/2013   Tobacco abuse 10/17/2013   Neuropathy, diabetic (HCC) 01/31/2012   Hypertension associated with diabetes (HCC)    Dyslipidemia associated with type 2 diabetes mellitus (HCC)    Hyperlipidemia     PCP: Georgina Quint, MD  REFERRING PROVIDER: Rodolph Bong, MD  REFERRING DIAG: [phone] (ICD-10-CM) - Left hip pain  THERAPY DIAG:  Pain in left hip  Muscle weakness (generalized)  Other abnormalities of gait and mobility  Cramp and spasm  Rationale for Evaluation and Treatment: Rehabilitation  ONSET DATE: October 2024  SUBJECTIVE:   SUBJECTIVE STATEMENT: Patient reports he is having less pain overall during the day, but still wakes up with 8/10 pain at about 4 AM.  PERTINENT HISTORY: OA, Anxiety, Diabetes, CAD,  HTN, Hx of lumbar fusion with reign cage in low back by Dr Danielle Dess  PAIN: 03/21/2023 Are you having pain? Yes: NPRS scale: 4/10 Pain location: Left hip Pain description: aching, sore Aggravating factors: worse in the morning, sometimes wakes him up hurting Relieving factors: sometimes rest and sometimes cold pack, but uncertain  PRECAUTIONS: None  RED FLAGS: None   WEIGHT BEARING RESTRICTIONS: No  FALLS:  Has patient fallen in last 6 months? 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  NEXT MD VISIT: Dr Denyse Amass on  04/19/23  OBJECTIVE:  Note: Objective measures were completed at Evaluation unless otherwise noted.  DIAGNOSTIC FINDINGS:  Left Hip Radiograph on 03/06/23: IMPRESSION: No acute osseous findings or significant arthropathic changes. Minimal degenerative changes of the hips and sacroiliac joints.  PATIENT SURVEYS:  Eval:  LEFS  30 / 80 = 37.5 % 03/30/2023:  Lower Extremity Functional Score: 43 / 80 = 53.8 %  COGNITION: Overall cognitive status: Within functional limits for tasks assessed     SENSATION: Patient denies numbness or tingling  MUSCLE LENGTH: Hamstrings: Tightness bilaterally, but L>R  POSTURE: rounded shoulders  PALPATION: Tender to palpation on left hip  LOWER EXTREMITY ROM:  Eval:  WFL, but some pain with left LE ROM  LOWER EXTREMITY MMT:  Eval: Left hip strength grossly 4/5  FUNCTIONAL TESTS:  Eval: 5 times sit to stand: 17.16 sec Single Leg Stance: Right 13.14  sec, Left 12.19 sec  03/30/2023: 5 times sit to stand:  13.02 sec 6 minute walk test:  1149 ft with pain increasing to 8/10 (pain started to increase at minute 2)  GAIT: Distance walked: >500 ft Assistive device utilized: None Level of assistance: Complete Independence Comments: Patient states that he gets some increased pain with walking, but overall, feels better when he walks                                                                                                                                TODAY'S TREATMENT   DATE: 03/30/2023 NuStep Level 4 x4 mins- PT present to discuss status  5 times sit to stand 6 minute walk test:  1149 ft with pain increasing to 8/10 (pain started to increase at minute 2) Sit to/from stand holding 5# kettlebell x10 Prone hip extension 2x10 bilat Prone hamstring curl 2x10 bilat Manual:  Addaday to left glut/piriformis, IT band, and hamstring to promote decreased pain and tissue mobility/elongation   DATE: 03/21/2023  NuStep Level 3 9 mins- PT present  to discuss status  Standing hip extension with yellow loop 2 x 10 bilateral  Hip flexion with unilateral 5# KB hold x 20 Supine SLR 2 x 10 bilateral  Bridges 2 x 10 Sit to stand holding 5# db x 10 Leg Press 60# 2 x 10 Hip matrix (hip abduction) 25# x 10 bilateral    DATE: 03/14/2023  NuStep Level 5 7 mins- PT present to discuss status  Hamstring stretch on 2nd step Lt 2 x 30 sec Hip flexor stretch on Lt 2 x 30 sec Sit to stand  x 10 hold 7# DB Side stepping with red TB loop x 4  Hip extension with red TB loop 2 x 10 bilateral  Step ups on stair holding 5# DB x 20 each leg Supine SLR 2 x 10 bilateral  Bridges 2 x 10    PATIENT EDUCATION:  Education details: Issued HEP Person educated: Patient Education method: Programmer, multimedia, Facilities manager, and Handouts Education comprehension: verbalized understanding and returned demonstration  HOME EXERCISE PROGRAM: Access Code: [email or web address] URL: [email or web address] Date: 03/14/2023 Prepared by: Claude Manges  Exercises - Seated Scapular Retraction  - 1 x daily - 7 x weekly - 2 sets - 10 reps - Seated Hamstring Stretch  - 1 x daily - 7 x weekly - 2 reps - 20 sec hold - Seated Sciatic Tensioner  - 1 x daily - 7 x weekly - 2 sets - 10 reps - Sit to Stand Without Arm Support  - 1 x daily - 7 x weekly - 2 sets - 10 reps - Hip Flexion Stretch  - 1 x daily - 7 x weekly - 2 sets - 30 hold - Supine Hip Internal and External Rotation  - 1 x daily - 7 x weekly - 1 sets - 10 reps - Seated Piriformis Stretch with Trunk Bend  - 1 x daily - 7 x weekly - 2 sets - 30 hold - Supine Gluteus Stretch  - 1 x daily - 7 x weekly - 2 sets - 30 hold - Side Stepping with Resistance at Thighs  - 1 x daily - 7 x weekly - 3 sets - Hip Extension with Resistance Loop  - 1 x daily - 7 x weekly - 2 sets - 10 reps - Small Range Straight Leg Raise  - 1 x daily - 7 x weekly - 2 sets - 10 reps - Supine Bridge  - 1 x daily - 7 x weekly - 2 sets - 10  reps  ASSESSMENT:  CLINICAL IMPRESSION: Mr Tally presents to skilled PT reporting that he is doing well at coming once a week and is being compliant with his HEP.  Patient with improved score noted on LEFS.  Patient able to complete a 6 minute walk test today, but noted that he started having increased pain around the 2 minute mark.  At the end of the 6 minute walk test, pain reports pain of 8/10.  Patient continues to progress with strengthening.  At end of session, added instrument assisted manual therapy with Addaday to assist with decreased pain and tissue mobility.  Following addaday manual therapy, patient reported significant decreased pain and that his hip did not feel as tight as it did before.  Patient continues to require skilled PT to progress towards goal related activities and decreased pain with functional tasks.    OBJECTIVE IMPAIRMENTS: decreased balance, difficulty walking, decreased strength, increased muscle spasms, impaired flexibility, postural dysfunction, and pain.   ACTIVITY LIMITATIONS: lifting, bending, standing, squatting, and stairs  PARTICIPATION LIMITATIONS: cleaning, community activity, and yard work  PERSONAL  FACTORS: Time since onset of injury/illness/exacerbation and 1-2 comorbidities: OA, Lumbar Fusion with reign cage  are also affecting patient's functional outcome.   REHAB POTENTIAL: Good  CLINICAL DECISION MAKING: Stable/uncomplicated  EVALUATION COMPLEXITY: Low   GOALS: Goals reviewed with patient? Yes  SHORT TERM GOALS: Target date: 03/24/2023 Patient will be independent with initial HEP. Baseline: Goal status: MET 03/14/2023  2.  Patient will participate in a 6 minute walk test to establish a baseline for distance and pain. Baseline:  Goal status: Met on 03/30/2023   LONG TERM GOALS: Target date: 05/05/2023  Patient will be independent with advanced HEP to allow for self-progression after discharge. Baseline:  Goal status: Ongoing  2.   Patient will be able to return to typical walking program and normal pace without increased pain. Baseline: Patient reports increased pain with walking with son and his dog Goal status: Ongoing  3.  Patient will increase Lower Extremity Functional Scale score to at least 55% to demonstrate improved functional mobility. Baseline: 37.5% Goal status: Met on 03/30/23  4.  Patient will increase bilateral single leg stance to greater than 30 seconds to decrease risk of falling. Baseline:  Goal status: Ongoing  5.  Patient will increase left hip strength to [facility] to allow him to be able to perform yard work and carpentry without difficulty. Baseline:  Goal status: Ongoing   PLAN:  PT FREQUENCY: 2x/week  PT DURATION: 8 weeks  PLANNED INTERVENTIONS: 97164- PT Re-evaluation, 97110-Therapeutic exercises, 97530- Therapeutic activity, [id number]- Neuromuscular re-education, 97535- Self Care, 16109- Manual therapy, [phone]- Gait training, [phone]- Canalith repositioning, [id number]- Aquatic Therapy, 97014- Electrical stimulation (unattended), [id number]- Electrical stimulation (manual), [id number]- Vasopneumatic device, [id number]- Ultrasound, [id number]- Traction (mechanical), [id number]- Ionotophoresis 4mg /ml Dexamethasone, Patient/Family education, Balance training, Stair training, Taping, Dry Needling, Joint mobilization, Joint manipulation, Spinal manipulation, Spinal mobilization, Vestibular training, Cryotherapy, and Moist heat  PLAN FOR NEXT SESSION: continue hip strengthening and flexibility; hip matrix; manual/ DN as indicated    Reather Laurence, PT, DPT 03/30/23, 10:51 AM  [facility] [address] [place], [place] [address] Phone # [phone] Fax [phone]

## 2023-03-30 NOTE — Progress Notes (Signed)
Patient presents in office today for a high dose flu vaccine today. Tolerated injection well.

## 2023-04-02 ENCOUNTER — Other Ambulatory Visit: Payer: Self-pay | Admitting: Emergency Medicine

## 2023-04-02 DIAGNOSIS — E1159 Type 2 diabetes mellitus with other circulatory complications: Secondary | ICD-10-CM

## 2023-04-04 ENCOUNTER — Ambulatory Visit: Payer: Managed Care, Other (non HMO) | Admitting: Rehabilitative and Restorative Service Providers"

## 2023-04-04 ENCOUNTER — Encounter: Payer: Self-pay | Admitting: Rehabilitative and Restorative Service Providers"

## 2023-04-04 DIAGNOSIS — R2689 Other abnormalities of gait and mobility: Secondary | ICD-10-CM

## 2023-04-04 DIAGNOSIS — M25552 Pain in left hip: Secondary | ICD-10-CM | POA: Diagnosis not present

## 2023-04-04 DIAGNOSIS — R252 Cramp and spasm: Secondary | ICD-10-CM

## 2023-04-04 DIAGNOSIS — M6281 Muscle weakness (generalized): Secondary | ICD-10-CM

## 2023-04-04 NOTE — Therapy (Signed)
 OUTPATIENT PHYSICAL THERAPY TREATMENT NOTE   Patient Name: Seth Pope MRN: [id number] DOB:09/23/1950, 73 y.o., male Today's Date: 04/04/2023  END OF SESSION:  PT End of Session - 04/04/23 1020     Visit Number 6    Date for PT Re-Evaluation 05/05/23    Authorization Type [employer]    PT Start Time 1016    PT Stop Time 1055    PT Time Calculation (min) 39 min    Activity Tolerance Patient tolerated treatment well    Behavior During Therapy WFL for tasks assessed/performed                Past Medical History:  Diagnosis Date   Anxiety    Arthritis    hands   CAD (coronary artery disease)    a. cath 10/23/13: nonobstructive CAD, nl LV function, rec risk factor modification   Chronic kidney disease    h/o kidney stone - passed stone   Diabetes mellitus    type 2   History of pneumonia    Hyperlipidemia    Hypertension    Liver mass    Sinus bradycardia    a. as low as 36 bpm; b. multiple pauses 2.1-2.3 seconds   Ulcer    Umbilical hernia    Past Surgical History:  Procedure Laterality Date   BACK SURGERY     CERVICAL SPINE SURGERY     COLONOSCOPY  2015   Dr Loreta Ave 3 TAs, tics, hems   INGUINAL HERNIA REPAIR Bilateral 09/16/2019   Procedure: LAPAROSCOPIC BILATERAL INGUINAL HERNIA;  Surgeon: Luretha Murphy, MD;  Location: [facility];  Service: General;  Laterality: Bilateral;   KNEE SURGERY Right 1970   LEFT HEART CATHETERIZATION WITH CORONARY ANGIOGRAM N/A 10/23/2013   Procedure: LEFT HEART CATHETERIZATION WITH CORONARY ANGIOGRAM;  Surgeon: Peter M Swaziland, MD;  Location: [facility] C[facility];  Service: Cardiovascular;  Laterality: N/A;   MICROLARYNGOSCOPY N/A 09/23/2015   Procedure: MICROLARYNGOSCOPY REMOVAL OF LESION;  Surgeon: Serena Colonel, MD;  Location: [facility];  Service: ENT;  Laterality: N/A;   NM MYOCAR PERF WALL MOTION  08/2010   bruce myoview - normal perfusion in all regions, EF 66%, EKG negative for ischemia, no wall motion abnormalities, normal/low risk study                                         ROTATOR CUFF REPAIR Bilateral 1991   x2   UMBILICAL HERNIA REPAIR  1988   Patient Active Problem List   Diagnosis Date Noted   Left hip pain 03/06/2023   Generalized anxiety disorder 03/02/2021   Family history of coronary artery disease in father 10/23/2013   Tobacco abuse 10/17/2013   Neuropathy, diabetic (HCC) 01/31/2012   Hypertension associated with diabetes (HCC)    Dyslipidemia associated with type 2 diabetes mellitus (HCC)    Hyperlipidemia     PCP: Georgina Quint, MD  REFERRING PROVIDER: Rodolph Bong, MD  REFERRING DIAG: [phone] (ICD-10-CM) - Left hip pain  THERAPY DIAG:  Pain in left hip  Muscle weakness (generalized)  Other abnormalities of gait and mobility  Cramp and spasm  Rationale for Evaluation and Treatment: Rehabilitation  ONSET DATE: October 2024  SUBJECTIVE:   SUBJECTIVE STATEMENT: Patient reports that he had a lot less pain after the manual work last time.  States that about 30 min - 1 hour after he left, he felt  his muscles relax a lot.  Patient states that he had increased pain last night (up to 8-9/10), but admitted that he had been doing some remodeling of his home and carried some doors and did some stairs and twisting.  PERTINENT HISTORY: OA, Anxiety, Diabetes, CAD, HTN, Hx of lumbar fusion with reign cage in low back by Dr Danielle Dess  PAIN: 03/21/2023 Are you having pain? Yes: NPRS scale: 5/10 Pain location: Left hip Pain description: aching, sore Aggravating factors: worse in the morning, sometimes wakes him up hurting Relieving factors: sometimes rest and sometimes cold pack, but uncertain  PRECAUTIONS: None  RED FLAGS: None   WEIGHT BEARING RESTRICTIONS: No  FALLS:  Has patient fallen in last 6 months? 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  NEXT MD VISIT: Dr Denyse Amass on 04/19/23  OBJECTIVE:  Note: Objective measures were completed at Evaluation unless otherwise noted.  DIAGNOSTIC FINDINGS:  Left Hip Radiograph on 03/06/23: IMPRESSION: No acute osseous findings or significant arthropathic changes. Minimal degenerative changes of the hips and sacroiliac joints.  PATIENT SURVEYS:  Eval:  LEFS  30 / 80 = 37.5 % 03/30/2023:  Lower Extremity Functional Score: 43 / 80 = 53.8 %  COGNITION: Overall cognitive status: Within functional limits for tasks assessed     SENSATION: Patient denies numbness or tingling  MUSCLE LENGTH: Hamstrings: Tightness bilaterally, but L>R  POSTURE: rounded shoulders  PALPATION: Tender to palpation on left hip  LOWER EXTREMITY ROM:  Eval:  WFL, but some pain with left LE ROM  LOWER EXTREMITY MMT:  Eval: Left hip strength grossly 4/5  FUNCTIONAL TESTS:  Eval: 5 times sit to stand: 17.16 sec Single Leg Stance: Right 13.14  sec, Left 12.19 sec  03/30/2023: 5 times sit to stand:  13.02 sec 6 minute walk test:  1149 ft with pain increasing to 8/10 (pain started to increase at minute 2)  GAIT: Distance walked: >500 ft Assistive device utilized: None Level of assistance: Complete Independence Comments: Patient states that he gets some increased pain with walking, but overall, feels better when he walks                                                                                                                                TODAY'S TREATMENT   DATE: 04/04/2023 NuStep Level 5 x6 mins- PT present to discuss status Leg Press (seat at 7) 70# 2x10 4 way resisted gait with 10# cable pulley x3 each direction Alt LE toe tap to 6" step x10, to 12" step x10 Sit to/from stand  holding 7# dumbbell x10 Standing unilateral  farmer's carry with 7# with high marching x10 with weight in each hand Sidelying clamshell x15 bilat Manual:  Addaday to left glut/piriformis, IT band, and hamstring to promote decreased pain and tissue mobility/elongation   DATE: 03/30/2023 NuStep Level 4 x4 mins- PT present to discuss status  5 times sit to stand 6 minute walk test:  1149 ft with pain increasing to 8/10 (pain started to increase at minute 2) Sit to/from stand holding 5# kettlebell x10 Prone hip extension 2x10 bilat Prone hamstring curl 2x10 bilat Manual:  Addaday to left glut/piriformis, IT band, and hamstring to promote decreased pain and tissue mobility/elongation   DATE: 03/21/2023  NuStep Level 3 9 mins- PT present to discuss status  Standing hip extension with yellow loop 2 x 10 bilateral  Hip flexion with unilateral 5# KB hold x 20 Supine SLR 2 x 10 bilateral  Bridges 2 x 10 Sit to stand holding 5# db x 10 Leg Press 60# 2 x 10 Hip matrix (hip abduction) 25# x 10 bilateral    PATIENT EDUCATION:  Education details: Issued HEP Person educated: Patient Education method: Explanation, Demonstration, and Handouts Education comprehension: verbalized understanding and returned demonstration  HOME EXERCISE PROGRAM: Access Code: [email or web address] URL: [email or web address] Date: 03/14/2023 Prepared by: Claude Manges  Exercises - Seated Scapular Retraction  - 1 x daily - 7 x weekly - 2 sets - 10 reps - Seated Hamstring Stretch  - 1 x daily - 7 x weekly - 2 reps - 20 sec hold - Seated Sciatic Tensioner  - 1 x daily - 7 x weekly - 2 sets - 10 reps - Sit to Stand Without Arm Support  - 1 x daily - 7 x weekly - 2 sets - 10 reps - Hip Flexion Stretch  - 1 x daily - 7 x weekly - 2 sets - 30 hold - Supine Hip Internal and External Rotation  - 1 x daily - 7 x weekly - 1 sets - 10 reps - Seated Piriformis Stretch with Trunk Bend  - 1 x daily - 7 x weekly - 2 sets - 30  hold - Supine Gluteus Stretch  - 1 x daily - 7 x weekly - 2 sets - 30 hold - Side Stepping with Resistance at Thighs  - 1 x daily - 7 x weekly - 3 sets - Hip Extension with Resistance Loop  - 1 x daily - 7 x weekly - 2 sets - 10 reps - Small Range Straight Leg Raise  - 1 x daily - 7 x weekly - 2 sets - 10 reps - Supine Bridge  - 1 x daily - 7 x weekly - 2 sets - 10 reps  ASSESSMENT:  CLINICAL IMPRESSION: Mr Swander presents to skilled PT reporting that he is still having increased pain when he does more activities.  Patient did report a relief of pain with manual therapy with Addaday, so ended session with that again.  Patient noted to have less tension with instrument assisted manual therapy today.  Patient continues to progress with goal related activities and overall improved functional strength.  Patient continues to require skilled PT to progress towards goal related activities.    OBJECTIVE IMPAIRMENTS: decreased balance, difficulty walking, decreased strength, increased muscle spasms, impaired flexibility, postural dysfunction, and pain.   ACTIVITY LIMITATIONS: lifting, bending, standing, squatting, and stairs  PARTICIPATION LIMITATIONS: cleaning, community activity, and yard work  PERSONAL FACTORS: Time since onset of injury/illness/exacerbation and 1-2 comorbidities: OA, Lumbar Fusion with reign cage  are also affecting patient's functional outcome.   REHAB POTENTIAL: Good  CLINICAL DECISION MAKING: Stable/uncomplicated  EVALUATION COMPLEXITY: Low   GOALS: Goals reviewed with patient? Yes  SHORT TERM GOALS: Target date: 03/24/2023 Patient will be independent with initial HEP. Baseline: Goal status: MET 03/14/2023  2.  Patient will participate in a 6 minute walk test to establish a baseline for distance and pain. Baseline:  Goal status: Met on 03/30/2023   LONG TERM GOALS: Target date: 05/05/2023  Patient will be independent with advanced HEP to allow for self-progression  after discharge. Baseline:  Goal status: Ongoing  2.  Patient will be able to return to typical walking program and normal pace without increased pain. Baseline: Patient reports increased pain with walking with son and his dog Goal status: Ongoing  3.  Patient will increase Lower Extremity Functional Scale score to at least 55% to demonstrate improved functional mobility. Baseline: 37.5% Goal status: Met on 03/30/23  4.  Patient will increase bilateral single leg stance to greater than 30 seconds to decrease risk of falling. Baseline:  Goal status: Ongoing  5.  Patient will increase left hip strength to [facility] to allow him to be able to perform yard work and carpentry without difficulty. Baseline:  Goal status: Ongoing   PLAN:  PT FREQUENCY: 2x/week  PT DURATION: 8 weeks  PLANNED INTERVENTIONS: 97164- PT Re-evaluation, 97110-Therapeutic exercises, 97530- Therapeutic activity, [id number]- Neuromuscular re-education, 97535- Self Care, 98119- Manual therapy, [id number]- Gait training, [phone]- Canalith repositioning, [id number]- Aquatic Therapy, 97014- Electrical stimulation (unattended), [id number]- Electrical stimulation (manual), [id number]- Vasopneumatic device, [id number]- Ultrasound, [id number]- Traction (mechanical), [id number]- Ionotophoresis 4mg /ml Dexamethasone, Patient/Family education, Balance training, Stair training, Taping, Dry Needling, Joint mobilization, Joint manipulation, Spinal manipulation, Spinal mobilization, Vestibular training, Cryotherapy, and Moist heat  PLAN FOR NEXT SESSION: continue hip strengthening and flexibility; hip matrix; manual/ DN as indicated    Reather Laurence, PT, DPT 04/04/23, 12:56 PM  [facility] [address], [address] [place], [place] [address] Phone # [phone] Fax [phone]

## 2023-04-06 ENCOUNTER — Encounter: Payer: Managed Care, Other (non HMO) | Admitting: Rehabilitative and Restorative Service Providers"

## 2023-04-12 ENCOUNTER — Ambulatory Visit: Payer: Managed Care, Other (non HMO) | Admitting: Rehabilitative and Restorative Service Providers"

## 2023-04-12 ENCOUNTER — Encounter: Payer: Self-pay | Admitting: Rehabilitative and Restorative Service Providers"

## 2023-04-12 DIAGNOSIS — M6281 Muscle weakness (generalized): Secondary | ICD-10-CM

## 2023-04-12 DIAGNOSIS — R2689 Other abnormalities of gait and mobility: Secondary | ICD-10-CM

## 2023-04-12 DIAGNOSIS — M25552 Pain in left hip: Secondary | ICD-10-CM

## 2023-04-12 DIAGNOSIS — R252 Cramp and spasm: Secondary | ICD-10-CM

## 2023-04-12 NOTE — Therapy (Signed)
 OUTPATIENT PHYSICAL THERAPY TREATMENT NOTE   Patient Name: Seth Pope MRN: [id number] DOB:02-18-1950, 73 y.o., male Today's Date: 04/12/2023  END OF SESSION:  PT End of Session - 04/12/23 1022     Visit Number 7    Date for PT Re-Evaluation 05/05/23    Authorization Type [employer]    PT Start Time 1019    PT Stop Time 1100    PT Time Calculation (min) 41 min    Activity Tolerance Patient tolerated treatment well    Behavior During Therapy WFL for tasks assessed/performed                Past Medical History:  Diagnosis Date   Anxiety    Arthritis    hands   CAD (coronary artery disease)    a. cath 10/23/13: nonobstructive CAD, nl LV function, rec risk factor modification   Chronic kidney disease    h/o kidney stone - passed stone   Diabetes mellitus    type 2   History of pneumonia    Hyperlipidemia    Hypertension    Liver mass    Sinus bradycardia    a. as low as 36 bpm; b. multiple pauses 2.1-2.3 seconds   Ulcer    Umbilical hernia    Past Surgical History:  Procedure Laterality Date   BACK SURGERY     CERVICAL SPINE SURGERY     COLONOSCOPY  2015   Dr Loreta Ave 3 TAs, tics, hems   INGUINAL HERNIA REPAIR Bilateral 09/16/2019   Procedure: LAPAROSCOPIC BILATERAL INGUINAL HERNIA;  Surgeon: Luretha Murphy, MD;  Location: [facility];  Service: General;  Laterality: Bilateral;   KNEE SURGERY Right 1970   LEFT HEART CATHETERIZATION WITH CORONARY ANGIOGRAM N/A 10/23/2013   Procedure: LEFT HEART CATHETERIZATION WITH CORONARY ANGIOGRAM;  Surgeon: Peter M Swaziland, MD;  Location: [facility] CATH LAB;  Service: Cardiovascular;  Laterality: N/A;   MICROLARYNGOSCOPY N/A 09/23/2015   Procedure: MICROLARYNGOSCOPY REMOVAL OF LESION;  Surgeon: Serena Colonel, MD;  Location: [facility] OR;  Service: ENT;  Laterality: N/A;   NM MYOCAR PERF WALL MOTION  08/2010   bruce myoview - normal perfusion in all regions, EF 66%, EKG negative for ischemia, no wall motion abnormalities, normal/low risk study                                         ROTATOR CUFF REPAIR Bilateral 1991   x2   UMBILICAL HERNIA REPAIR  1988   Patient Active Problem List   Diagnosis Date Noted   Left hip pain 03/06/2023   Generalized anxiety disorder 03/02/2021   Family history of coronary artery disease in father 10/23/2013   Tobacco abuse 10/17/2013   Neuropathy, diabetic (HCC) 01/31/2012   Hypertension associated with diabetes (HCC)    Dyslipidemia associated with type 2 diabetes mellitus (HCC)    Hyperlipidemia     PCP: Georgina Quint, MD  REFERRING PROVIDER: Rodolph Bong, MD  REFERRING DIAG: [phone] (ICD-10-CM) - Left hip pain  THERAPY DIAG:  Pain in left hip  Muscle weakness (generalized)  Other abnormalities of gait and mobility  Cramp and spasm  Rationale for Evaluation and Treatment: Rehabilitation  ONSET DATE: October 2024  SUBJECTIVE:   SUBJECTIVE STATEMENT: Patient states that he is having some pain, but admits that he has been doing more work.  PERTINENT HISTORY: OA, Anxiety, Diabetes, CAD, HTN, Hx of lumbar fusion  with reign cage in low back by Dr Danielle Dess  PAIN: 03/21/2023 Are you having pain? Yes: NPRS scale: 5-6/10 Pain location: Left hip Pain description: aching, sore Aggravating factors: worse in the morning, sometimes wakes him up hurting Relieving factors: sometimes rest and sometimes cold pack, but uncertain  PRECAUTIONS: None  RED FLAGS: None   WEIGHT BEARING RESTRICTIONS: No  FALLS:  Has patient fallen in last 6 months? 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  NEXT MD VISIT: Dr Denyse Amass on 04/19/23  OBJECTIVE:   Note: Objective measures were completed at Evaluation unless otherwise noted.  DIAGNOSTIC FINDINGS:  Left Hip Radiograph on 03/06/23: IMPRESSION: No acute osseous findings or significant arthropathic changes. Minimal degenerative changes of the hips and sacroiliac joints.  PATIENT SURVEYS:  Eval:  LEFS  30 / 80 = 37.5 % 03/30/2023:  Lower Extremity Functional Score: 43 / 80 = 53.8 %  COGNITION: Overall cognitive status: Within functional limits for tasks assessed     SENSATION: Patient denies numbness or tingling  MUSCLE LENGTH: Hamstrings: Tightness bilaterally, but L>R  POSTURE: rounded shoulders  PALPATION: Tender to palpation on left hip  LOWER EXTREMITY ROM:  Eval:  WFL, but some pain with left LE ROM  LOWER EXTREMITY MMT:  Eval: Left hip strength grossly 4/5  FUNCTIONAL TESTS:  Eval: 5 times sit to stand: 17.16 sec Single Leg Stance: Right 13.14  sec, Left 12.19 sec  03/30/2023: 5 times sit to stand:  13.02 sec 6 minute walk test:  1149 ft with pain increasing to 8/10 (pain started to increase at minute 2)  GAIT: Distance walked: >500 ft Assistive device utilized: None Level of assistance: Complete Independence Comments: Patient states that he gets some increased pain with walking, but overall, feels better when he walks                                                                                                                                TODAY'S TREATMENT   DATE: 04/12/2023 NuStep Level 5 x7 mins- PT present to discuss status Leg Press (seat at 7) 70# 2x10 Seated hamstring stretch 2x20 sec bilat Sit to/from stand holding 7# dumbbell x10 Seated modified dead lift with 7# dumbbell 2x10 Standing unilateral farmer's carry with 7# with high marching x10 with weight in each hand Sidelying clamshell 2x10 bilat Supine bent knee fall out 2x10 Supine straight leg raise 2x10 bilat Supine bridge 2x10   DATE: 04/04/2023 NuStep Level 5 x6 mins- PT  present to discuss status Leg Press (seat at 7) 70# 2x10 4 way resisted gait with 10# cable pulley  x3 each direction Alt LE toe tap to 6" step x10, to 12" step x10 Sit to/from stand holding 7# dumbbell x10 Standing unilateral farmer's carry with 7# with high marching x10 with weight in each hand Sidelying clamshell x15 bilat Manual:  Addaday to left glut/piriformis, IT band, and hamstring to promote decreased pain and tissue mobility/elongation   DATE: 03/30/2023 NuStep Level 4 x4 mins- PT present to discuss status  5 times sit to stand 6 minute walk test:  1149 ft with pain increasing to 8/10 (pain started to increase at minute 2) Sit to/from stand holding 5# kettlebell x10 Prone hip extension 2x10 bilat Prone hamstring curl 2x10 bilat Manual:  Addaday to left glut/piriformis, IT band, and hamstring to promote decreased pain and tissue mobility/elongation    PATIENT EDUCATION:  Education details: Issued HEP Person educated: Patient Education method: Explanation, Demonstration, and Handouts Education comprehension: verbalized understanding and returned demonstration  HOME EXERCISE PROGRAM: Access Code: [email or web address] URL: [email or web address] Date: 03/14/2023 Prepared by: Claude Manges  Exercises - Seated Scapular Retraction  - 1 x daily - 7 x weekly - 2 sets - 10 reps - Seated Hamstring Stretch  - 1 x daily - 7 x weekly - 2 reps - 20 sec hold - Seated Sciatic Tensioner  - 1 x daily - 7 x weekly - 2 sets - 10 reps - Sit to Stand Without Arm Support  - 1 x daily - 7 x weekly - 2 sets - 10 reps - Hip Flexion Stretch  - 1 x daily - 7 x weekly - 2 sets - 30 hold - Supine Hip Internal and External Rotation  - 1 x daily - 7 x weekly - 1 sets - 10 reps - Seated Piriformis Stretch with Trunk Bend  - 1 x daily - 7 x weekly - 2 sets - 30 hold - Supine Gluteus Stretch  - 1 x daily - 7 x weekly - 2 sets - 30 hold - Side Stepping with Resistance at Thighs  - 1 x daily - 7 x weekly  - 3 sets - Hip Extension with Resistance Loop  - 1 x daily - 7 x weekly - 2 sets - 10 reps - Small Range Straight Leg Raise  - 1 x daily - 7 x weekly - 2 sets - 10 reps - Supine Bridge  - 1 x daily - 7 x weekly - 2 sets - 10 reps  ASSESSMENT:  CLINICAL IMPRESSION: Mr Rafter presents to skilled PT reporting that he is doing more around his home, but still has some pain.  Patient states that he's trying to be more compliant with his HEP.  Patient with great participation throughout visit.  Requires slight tactile and verbal cuing with sidelying clamshells to improve body positioning to optimize glute med activation.  Reviewed some of the exercises for HEP in clinic today to be certain of patient safety and independence.  Patient continues to require skilled PT to progress towards goal related activities.    OBJECTIVE IMPAIRMENTS: decreased balance, difficulty walking, decreased strength, increased muscle spasms, impaired flexibility, postural dysfunction, and pain.   ACTIVITY LIMITATIONS: lifting, bending, standing, squatting, and stairs  PARTICIPATION LIMITATIONS: cleaning, community activity, and yard work  PERSONAL FACTORS: Time since onset of injury/illness/exacerbation and 1-2 comorbidities: OA, Lumbar Fusion with reign cage  are also affecting patient's functional outcome.   REHAB POTENTIAL: Good  CLINICAL DECISION MAKING: Stable/uncomplicated  EVALUATION COMPLEXITY: Low   GOALS: Goals reviewed with patient? Yes  SHORT TERM  GOALS: Target date: 03/24/2023 Patient will be independent with initial HEP. Baseline: Goal status: MET 03/14/2023  2.  Patient will participate in a 6 minute walk test to establish a baseline for distance and pain. Baseline:  Goal status: Met on 03/30/2023   LONG TERM GOALS: Target date: 05/05/2023  Patient will be independent with advanced HEP to allow for self-progression after discharge. Baseline:  Goal status: Ongoing  2.  Patient will be able to  return to typical walking program and normal pace without increased pain. Baseline: Patient reports increased pain with walking with son and his dog Goal status: Ongoing  3.  Patient will increase Lower Extremity Functional Scale score to at least 55% to demonstrate improved functional mobility. Baseline: 37.5% Goal status: Met on 03/30/23  4.  Patient will increase bilateral single leg stance to greater than 30 seconds to decrease risk of falling. Baseline:  Goal status: Ongoing  5.  Patient will increase left hip strength to [facility] to allow him to be able to perform yard work and carpentry without difficulty. Baseline:  Goal status: Ongoing   PLAN:  PT FREQUENCY: 2x/week  PT DURATION: 8 weeks  PLANNED INTERVENTIONS: 97164- PT Re-evaluation, 97110-Therapeutic exercises, 97530- Therapeutic activity, [id number]- Neuromuscular re-education, 97535- Self Care, 62130- Manual therapy, [phone]- Gait training, [phone]- Canalith repositioning, [id number]- Aquatic Therapy, 97014- Electrical stimulation (unattended), [id number]- Electrical stimulation (manual), [id number]- Vasopneumatic device, [id number]- Ultrasound, [id number]- Traction (mechanical), [id number]- Ionotophoresis 4mg /ml Dexamethasone, Patient/Family education, Balance training, Stair training, Taping, Dry Needling, Joint mobilization, Joint manipulation, Spinal manipulation, Spinal mobilization, Vestibular training, Cryotherapy, and Moist heat  PLAN FOR NEXT SESSION: continue hip strengthening and flexibility; hip matrix; manual/ DN as indicated, balance    Reather Laurence, PT, DPT 04/12/23, 11:01 AM  [facility] [address], [address] [place], [place] [address] Phone # [phone] Fax [phone]

## 2023-04-14 ENCOUNTER — Encounter: Payer: Managed Care, Other (non HMO) | Admitting: Rehabilitative and Restorative Service Providers"

## 2023-04-18 ENCOUNTER — Encounter: Payer: Managed Care, Other (non HMO) | Admitting: Rehabilitative and Restorative Service Providers"

## 2023-04-18 NOTE — Progress Notes (Unsigned)
   I, Rolland Bimler am a scribe for Dr. Denyse Amass.    Seth Pope is a 73 y.o. male who presents to [facility] today for f/u L hip pain. Pt was last seen by Dr. Denyse Amass on 03/08/23 and his and his cyclobenzaprine was refilled, and he was referred to PT, completing 7 visits.  Today, patient reports left hip today it is not bothering him. Having continuous off and on pain. Therapy has helped. Just started going once a week instead of twice. Doing exercises at home and goes walking. Pain wakes him up in the middle of the night around 4AM. The pain is more in his upper leg than in his hip. Weather changes causes pain in his knuckles and his hip.  He wonders if topical CBD could be helpful.  His wife uses it and has good benefit.  Dx imaging: 03/08/23 L-spine x-[place] 03/06/23 L hip XR  Pertinent review of systems: No fevers or chills  Relevant historical information: Hypertension and diabetes.   Exam:  BP 120/70   Pulse 61   Ht 5\' 10"  (1.778 m)   Wt 165 lb (74.8 kg)   SpO2 94%   BMI 23.68 kg/m  General: Well Developed, well nourished, and in no acute distress.   MSK: Left hip normal-appearing mildly tender to palpation just inferior to the greater trochanter.  Normal hip motion.  Hands bilaterally degenerative changes from MCPs to PIPs to DIPs.  No ulnar deviation.  Normal motion.      Assessment and Plan: 73 y.o. male with chronic left hip pain due to primarily to trochanteric bursitis/tendinitis.  Improving but not fully resolved with PT.  Continue home exercise and remaining limited PT.  If not improving consider steroid injection.  Will give it another month.  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  Recommend heat and Voltaren gel.  Okay to try topical CBD as well.  Recommend hand therapy exercises at home using modeling clay.  If needed consider formal OT referral for hand therapy.   PDMP not reviewed this encounter. No orders of the defined types were  placed in this encounter.  No orders of the defined types were placed in this encounter.    Discussed warning signs or symptoms. Please see discharge instructions. Patient expresses understanding.   The above documentation has been reviewed and is accurate and complete Clementeen Graham, M.D.

## 2023-04-19 ENCOUNTER — Ambulatory Visit: Payer: Managed Care, Other (non HMO) | Admitting: Family Medicine

## 2023-04-19 VITALS — BP 120/70 | HR 61 | Ht 70.0 in | Wt 165.0 lb

## 2023-04-19 DIAGNOSIS — M25552 Pain in left hip: Secondary | ICD-10-CM

## 2023-04-19 DIAGNOSIS — M79642 Pain in left hand: Secondary | ICD-10-CM | POA: Diagnosis not present

## 2023-04-19 DIAGNOSIS — M79641 Pain in right hand: Secondary | ICD-10-CM

## 2023-04-19 NOTE — Patient Instructions (Signed)
 Thank you for coming in today.   Please use Voltaren gel (Generic Diclofenac Gel) up to 4x daily for pain as needed.  This is available over-the-counter as both the name brand Voltaren gel and the generic diclofenac gel.   OK to use topical CBD cream.   Give it a bit of time. And if not good enough next step is an injection.   Let me know.

## 2023-04-20 ENCOUNTER — Ambulatory Visit
Payer: Managed Care, Other (non HMO) | Attending: Family Medicine | Admitting: Rehabilitative and Restorative Service Providers"

## 2023-04-20 ENCOUNTER — Encounter: Payer: Self-pay | Admitting: Rehabilitative and Restorative Service Providers"

## 2023-04-20 DIAGNOSIS — M25552 Pain in left hip: Secondary | ICD-10-CM | POA: Insufficient documentation

## 2023-04-20 DIAGNOSIS — R252 Cramp and spasm: Secondary | ICD-10-CM | POA: Diagnosis present

## 2023-04-20 DIAGNOSIS — R2689 Other abnormalities of gait and mobility: Secondary | ICD-10-CM | POA: Diagnosis present

## 2023-04-20 DIAGNOSIS — M6281 Muscle weakness (generalized): Secondary | ICD-10-CM | POA: Insufficient documentation

## 2023-04-20 NOTE — Therapy (Signed)
 OUTPATIENT PHYSICAL THERAPY TREATMENT NOTE   Patient Name: Seth Pope MRN: [id number] DOB:Jun 20, 1950, 73 y.o., male Today's Date: 04/20/2023  END OF SESSION:  PT End of Session - 04/20/23 1019     Visit Number 8    Date for PT Re-Evaluation 05/05/23    Authorization Type [employer]    PT Start Time 1017    PT Stop Time 1059    PT Time Calculation (min) 42 min    Activity Tolerance Patient tolerated treatment well    Behavior During Therapy WFL for tasks assessed/performed                Past Medical History:  Diagnosis Date   Anxiety    Arthritis    hands   CAD (coronary artery disease)    a. cath 10/23/13: nonobstructive CAD, nl LV function, rec risk factor modification   Chronic kidney disease    h/o kidney stone - passed stone   Diabetes mellitus    type 2   History of pneumonia    Hyperlipidemia    Hypertension    Liver mass    Sinus bradycardia    a. as low as 36 bpm; b. multiple pauses 2.1-2.3 seconds   Ulcer    Umbilical hernia    Past Surgical History:  Procedure Laterality Date   BACK SURGERY     CERVICAL SPINE SURGERY     COLONOSCOPY  2015   Dr Loreta Ave 3 TAs, tics, hems   INGUINAL HERNIA REPAIR Bilateral 09/16/2019   Procedure: LAPAROSCOPIC BILATERAL INGUINAL HERNIA;  Surgeon: Luretha Murphy, MD;  Location: [facility];  Service: General;  Laterality: Bilateral;   KNEE SURGERY Right 1970   LEFT HEART CATHETERIZATION WITH CORONARY ANGIOGRAM N/A 10/23/2013   Procedure: LEFT HEART CATHETERIZATION WITH CORONARY ANGIOGRAM;  Surgeon: Peter M Swaziland, MD;  Location: [facility] CATH LAB;  Service: Cardiovascular;  Laterality: N/A;   MICROLARYNGOSCOPY N/A 09/23/2015   Procedure: MICROLARYNGOSCOPY REMOVAL OF LESION;  Surgeon: Serena Colonel, MD;  Location: [facility] OR;  Service: ENT;  Laterality: N/A;   NM MYOCAR PERF WALL MOTION  08/2010   bruce myoview - normal perfusion in all regions, EF 66%, EKG negative for ischemia, no wall motion abnormalities, normal/low risk study                                         ROTATOR CUFF REPAIR Bilateral 1991   x2   UMBILICAL HERNIA REPAIR  1988   Patient Active Problem List   Diagnosis Date Noted   Left hip pain 03/06/2023   Generalized anxiety disorder 03/02/2021   Family history of coronary artery disease in father 10/23/2013   Tobacco abuse 10/17/2013   Neuropathy, diabetic (HCC) 01/31/2012   Hypertension associated with diabetes (HCC)    Dyslipidemia associated with type 2 diabetes mellitus (HCC)    Hyperlipidemia     PCP: Georgina Quint, MD  REFERRING PROVIDER: Rodolph Bong, MD  REFERRING DIAG: [phone] (ICD-10-CM) - Left hip pain  THERAPY DIAG:  Pain in left hip  Muscle weakness (generalized)  Other abnormalities of gait and mobility  Cramp and spasm  Rationale for Evaluation and Treatment: Rehabilitation  ONSET DATE: October 2024  SUBJECTIVE:   SUBJECTIVE STATEMENT: Patient states that he had a follow up with Dr Denyse Amass and he feels that his pain is still related to trochanteric bursitis and he wants him to continue  PT. Patient states that pain still wakes him up at night sometimes.  PERTINENT HISTORY: OA, Anxiety, Diabetes, CAD, HTN, Hx of lumbar fusion with reign cage in low back by Dr Danielle Dess  PAIN: 03/21/2023 Are you having pain? Yes: NPRS scale: 5/10 Pain location: Left hip Pain description: aching, sore Aggravating factors: worse in the morning, sometimes wakes him up hurting Relieving factors: sometimes rest and sometimes cold pack, but uncertain  PRECAUTIONS: None  RED FLAGS: None   WEIGHT BEARING RESTRICTIONS: No  FALLS:  Has patient fallen in last 6 months? 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  NEXT MD VISIT: Dr Denyse Amass on 04/19/23  OBJECTIVE:  Note: Objective measures were completed at Evaluation unless otherwise noted.  DIAGNOSTIC FINDINGS:  Left Hip Radiograph on 03/06/23: IMPRESSION: No acute osseous findings or significant arthropathic changes. Minimal degenerative changes of the hips and sacroiliac joints.  PATIENT SURVEYS:  Eval:  LEFS  30 / 80 = 37.5 % 03/30/2023:  Lower Extremity Functional Score: 43 / 80 = 53.8 %  COGNITION: Overall cognitive status: Within functional limits for tasks assessed     SENSATION: Patient denies numbness or tingling  MUSCLE LENGTH: Hamstrings: Tightness bilaterally, but L>R  POSTURE: rounded shoulders  PALPATION: Tender to palpation on left hip  LOWER EXTREMITY ROM:  Eval:  WFL, but some pain with left LE ROM  LOWER EXTREMITY MMT:  Eval: Left hip strength grossly 4/5  FUNCTIONAL TESTS:  Eval: 5 times sit to stand: 17.16 sec Single Leg Stance: Right 13.14  sec, Left 12.19 sec  03/30/2023: 5 times sit to stand:  13.02 sec 6 minute walk test:  1149 ft with pain increasing to 8/10 (pain started to increase at minute 2)  GAIT: Distance walked: >500 ft Assistive device utilized: None Level of assistance: Complete Independence Comments: Patient states that he gets some increased pain with walking, but overall, feels better when he walks                                                                                                                                TODAY'S TREATMENT   DATE: 04/20/2023 NuStep Level 5 x7 mins- PT present to discuss status Seated hamstring stretch 2x20 sec bilat Leg Press (seat at 7) 70# 2x10 Sit to/from stand holding 7# dumbbell x10 Side stepping with yellow loop around ankles 3x10' bilat Supine straight leg raise with 2# weight 2x10 bilat Supine bridge 2x10 Sidelying clamshell 2x10 bilat Prone hip extension with 2#  ankle weight x10 bilat Iontopatch with 4 hour wear time for 80 mA/min dose with 1mL of  Dexamethasone (pt provided with education about treatment and in agreement to utilize)   DATE: 04/12/2023 NuStep Level 5 x7 mins- PT present to discuss status Leg Press (seat at 7) 70# 2x10 Seated hamstring stretch 2x20 sec bilat Sit to/from stand holding 7# dumbbell x10 Seated modified dead lift with 7# dumbbell 2x10 Standing unilateral farmer's carry with 7# with high marching x10 with weight in each hand Sidelying clamshell 2x10 bilat Supine bent knee fall out 2x10 Supine straight leg raise 2x10 bilat Supine bridge 2x10   DATE: 04/04/2023 NuStep Level 5 x6 mins- PT present to discuss status Leg Press (seat at 7) 70# 2x10 4 way resisted gait with 10# cable pulley x3 each direction Alt LE toe tap to 6" step x10, to 12" step x10 Sit to/from stand holding 7# dumbbell x10 Standing unilateral farmer's carry with 7# with high marching x10 with weight in each hand Sidelying clamshell x15 bilat Manual:  Addaday to left glut/piriformis, IT band, and hamstring to promote decreased pain and tissue mobility/elongation   PATIENT EDUCATION:  Education details: Issued HEP Person educated: Patient Education method: Explanation, Demonstration, and Handouts Education comprehension: verbalized understanding and returned demonstration  HOME EXERCISE PROGRAM: Access Code: [email or web address] URL: [email or web address] Date: 03/14/2023 Prepared by: Claude Manges  Exercises - Seated Scapular Retraction  - 1 x daily - 7 x weekly - 2 sets - 10 reps - Seated Hamstring Stretch  - 1 x daily - 7 x weekly - 2 reps - 20 sec hold - Seated Sciatic Tensioner  - 1 x daily - 7 x weekly - 2 sets - 10 reps - Sit to Stand Without Arm Support  - 1 x daily - 7 x weekly - 2 sets - 10 reps - Hip Flexion Stretch  - 1 x daily - 7 x weekly - 2 sets - 30 hold - Supine Hip Internal and External Rotation  - 1 x daily - 7 x weekly -  1 sets - 10 reps - Seated Piriformis Stretch with Trunk Bend  - 1 x daily - 7 x weekly - 2 sets - 30 hold - Supine Gluteus Stretch  - 1 x daily - 7 x weekly - 2 sets - 30 hold - Side Stepping with Resistance at Thighs  - 1 x daily - 7 x weekly - 3 sets - Hip Extension with Resistance Loop  - 1 x daily - 7 x weekly - 2 sets - 10 reps - Small Range Straight Leg Raise  - 1 x daily - 7 x weekly - 2 sets - 10 reps - Supine Bridge  - 1 x daily - 7 x weekly - 2 sets - 10 reps  ASSESSMENT:  CLINICAL IMPRESSION: Mr Poynter presents to skilled PT reporting continued progress, but that he still wakes up due to pain.  Patient reports that Dr Denyse Amass advised him that he may need an injection in his hip in the pain continues, but he feels that it is more related to trochanteric bursitis. Patient able to progress with strengthening exercises during session.  Patient educated on use of iontopatch using Dexamethasone and he was in agreement to try the patch.  Patient educated on adverse effects and to immediately remove patch before the 4 hour end time with any of these symptoms.  Patient verbalizes understanding.  Patient continues to require skilled PT to progress towards goal related activities.    OBJECTIVE IMPAIRMENTS: decreased balance, difficulty walking, decreased strength, increased muscle spasms, impaired flexibility, postural dysfunction, and pain.  ACTIVITY LIMITATIONS: lifting, bending, standing, squatting, and stairs  PARTICIPATION LIMITATIONS: cleaning, community activity, and yard work  PERSONAL FACTORS: Time since onset of injury/illness/exacerbation and 1-2 comorbidities: OA, Lumbar Fusion with reign cage  are also affecting patient's functional outcome.   REHAB POTENTIAL: Good  CLINICAL DECISION MAKING: Stable/uncomplicated  EVALUATION COMPLEXITY: Low   GOALS: Goals reviewed with patient? Yes  SHORT TERM GOALS: Target date: 03/24/2023 Patient will be independent with initial  HEP. Baseline: Goal status: MET 03/14/2023  2.  Patient will participate in a 6 minute walk test to establish a baseline for distance and pain. Baseline:  Goal status: Met on 03/30/2023   LONG TERM GOALS: Target date: 05/05/2023  Patient will be independent with advanced HEP to allow for self-progression after discharge. Baseline:  Goal status: Ongoing  2.  Patient will be able to return to typical walking program and normal pace without increased pain. Baseline: Patient reports increased pain with walking with son and his dog Goal status: Ongoing  3.  Patient will increase Lower Extremity Functional Scale score to at least 55% to demonstrate improved functional mobility. Baseline: 37.5% Goal status: Met on 03/30/23  4.  Patient will increase bilateral single leg stance to greater than 30 seconds to decrease risk of falling. Baseline:  Goal status: Ongoing  5.  Patient will increase left hip strength to [facility] to allow him to be able to perform yard work and carpentry without difficulty. Baseline:  Goal status: Ongoing   PLAN:  PT FREQUENCY: 2x/week  PT DURATION: 8 weeks  PLANNED INTERVENTIONS: 97164- PT Re-evaluation, 97110-Therapeutic exercises, 97530- Therapeutic activity, [id number]- Neuromuscular re-education, 97535- Self Care, 16109- Manual therapy, [phone]- Gait training, [phone]- Canalith repositioning, [id number]- Aquatic Therapy, 97014- Electrical stimulation (unattended), [id number]- Electrical stimulation (manual), [id number]- Vasopneumatic device, [id number]- Ultrasound, [id number]- Traction (mechanical), [id number]- Ionotophoresis 4mg /ml Dexamethasone, Patient/Family education, Balance training, Stair training, Taping, Dry Needling, Joint mobilization, Joint manipulation, Spinal manipulation, Spinal mobilization, Vestibular training, Cryotherapy, and Moist heat  PLAN FOR NEXT SESSION: continue hip strengthening and flexibility; hip matrix; manual/ DN as indicated, balance    Reather Laurence, PT,  DPT 04/20/23, 12:11 PM  [facility] [address] [place], [place] [address] Phone # [phone] Fax [phone]

## 2023-04-20 NOTE — Patient Instructions (Signed)

## 2023-04-21 ENCOUNTER — Other Ambulatory Visit: Payer: Self-pay | Admitting: Emergency Medicine

## 2023-04-21 DIAGNOSIS — E7800 Pure hypercholesterolemia, unspecified: Secondary | ICD-10-CM

## 2023-04-21 NOTE — Telephone Encounter (Signed)
 Copied from CRM [id number]. Topic: Clinical - Medication Refill >> Apr 21, 2023  9:09 AM Drema Balzarine wrote: Most Recent Primary Care Visit:  Provider: Katharine Look  Department: [facility]  Visit Type: CLINICAL SUPPORT  Date: 03/30/2023  Medication: Atorvastatin   Has the patient contacted their pharmacy? Yes (Agent: If no, request that the patient contact the pharmacy for the refill. If patient does not wish to contact the pharmacy document the reason why and proceed with request.) (Agent: If yes, when and what did the pharmacy advise?)  Is this the correct pharmacy for this prescription? Yes If no, delete pharmacy and type the correct one.  This is the patient's preferred pharmacy:    [id number] [place], [place] - [address] AT [facility] [address] [place] [address] Phone: [phone] Fax: [phone]   Has the prescription been filled recently? Yes  Is the patient out of the medication? Yes, has been out for 3 days   Has the patient been seen for an appointment in the last year OR does the patient have an upcoming appointment? Yes  Can we respond through MyChart? Yes  Agent: Please be advised that Rx refills may take up to 3 business days. We ask that you follow-up with your pharmacy.

## 2023-04-21 NOTE — Telephone Encounter (Signed)
Correct pharmacy verified

## 2023-04-24 MED ORDER — ATORVASTATIN CALCIUM 80 MG PO TABS
ORAL_TABLET | ORAL | 3 refills | Status: AC
Start: 2023-04-24 — End: ?

## 2023-04-25 ENCOUNTER — Encounter: Payer: Self-pay | Admitting: Rehabilitative and Restorative Service Providers"

## 2023-04-25 ENCOUNTER — Ambulatory Visit: Payer: Managed Care, Other (non HMO) | Admitting: Rehabilitative and Restorative Service Providers"

## 2023-04-25 DIAGNOSIS — M25552 Pain in left hip: Secondary | ICD-10-CM | POA: Diagnosis not present

## 2023-04-25 DIAGNOSIS — R252 Cramp and spasm: Secondary | ICD-10-CM

## 2023-04-25 DIAGNOSIS — M6281 Muscle weakness (generalized): Secondary | ICD-10-CM

## 2023-04-25 DIAGNOSIS — R2689 Other abnormalities of gait and mobility: Secondary | ICD-10-CM

## 2023-04-25 NOTE — Therapy (Signed)
 OUTPATIENT PHYSICAL THERAPY TREATMENT NOTE   Patient Name: Seth Pope MRN: [id number] DOB:May 01, 1950, 73 y.o., male Today's Date: 04/25/2023  END OF SESSION:  PT End of Session - 04/25/23 1450     Visit Number 9    Date for PT Re-Evaluation 05/05/23    Authorization Type [employer]    PT Start Time 1447    PT Stop Time 1530    PT Time Calculation (min) 43 min    Activity Tolerance Patient tolerated treatment well    Behavior During Therapy WFL for tasks assessed/performed                Past Medical History:  Diagnosis Date   Anxiety    Arthritis    hands   CAD (coronary artery disease)    a. cath 10/23/13: nonobstructive CAD, nl LV function, rec risk factor modification   Chronic kidney disease    h/o kidney stone - passed stone   Diabetes mellitus    type 2   History of pneumonia    Hyperlipidemia    Hypertension    Liver mass    Sinus bradycardia    a. as low as 36 bpm; b. multiple pauses 2.1-2.3 seconds   Ulcer    Umbilical hernia    Past Surgical History:  Procedure Laterality Date   BACK SURGERY     CERVICAL SPINE SURGERY     COLONOSCOPY  2015   Dr Loreta Ave 3 TAs, tics, hems   INGUINAL HERNIA REPAIR Bilateral 09/16/2019   Procedure: LAPAROSCOPIC BILATERAL INGUINAL HERNIA;  Surgeon: Luretha Murphy, MD;  Location: [facility];  Service: General;  Laterality: Bilateral;   KNEE SURGERY Right 1970   LEFT HEART CATHETERIZATION WITH CORONARY ANGIOGRAM N/A 10/23/2013   Procedure: LEFT HEART CATHETERIZATION WITH CORONARY ANGIOGRAM;  Surgeon: Peter M Swaziland, MD;  Location: [facility] CATH LAB;  Service: Cardiovascular;  Laterality: N/A;   MICROLARYNGOSCOPY N/A 09/23/2015   Procedure: MICROLARYNGOSCOPY REMOVAL OF LESION;  Surgeon: Serena Colonel, MD;  Location: [facility] OR;  Service: ENT;  Laterality: N/A;   NM MYOCAR PERF WALL MOTION  08/2010   bruce myoview - normal perfusion in all regions, EF 66%, EKG negative for ischemia, no wall motion abnormalities, normal/low risk study                                         ROTATOR CUFF REPAIR Bilateral 1991   x2   UMBILICAL HERNIA REPAIR  1988   Patient Active Problem List   Diagnosis Date Noted   Left hip pain 03/06/2023   Generalized anxiety disorder 03/02/2021   Family history of coronary artery disease in father 10/23/2013   Tobacco abuse 10/17/2013   Neuropathy, diabetic (HCC) 01/31/2012   Hypertension associated with diabetes (HCC)    Dyslipidemia associated with type 2 diabetes mellitus (HCC)    Hyperlipidemia     PCP: Georgina Quint, MD  REFERRING PROVIDER: Rodolph Bong, MD  REFERRING DIAG: [phone] (ICD-10-CM) - Left hip pain  THERAPY DIAG:  Pain in left hip  Muscle weakness (generalized)  Other abnormalities of gait and mobility  Cramp and spasm  Rationale for Evaluation and Treatment: Rehabilitation  ONSET DATE: October 2024  SUBJECTIVE:   SUBJECTIVE STATEMENT: Patient states that the ionto patch seemed to help some last time.  Patient reports that he still wakes up around 4AM secondary to pain.  Patient reports some  increased pain currently, as he was sitting on a stool in his garage painting.  PERTINENT HISTORY: OA, Anxiety, Diabetes, CAD, HTN, Hx of lumbar fusion with reign cage in low back by Dr Danielle Dess  PAIN: 03/21/2023 Are you having pain? Yes: NPRS scale: 6/10 Pain location: Left hip Pain description: aching, sore Aggravating factors: worse in the morning, sometimes wakes him up hurting Relieving factors: sometimes rest and sometimes cold pack, but uncertain  PRECAUTIONS: None  RED FLAGS: None   WEIGHT BEARING RESTRICTIONS: No  FALLS:  Has patient fallen in last 6 months? 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  NEXT MD VISIT: Dr Denyse Amass on 04/19/23  OBJECTIVE:  Note: Objective measures were completed at Evaluation unless otherwise noted.  DIAGNOSTIC FINDINGS:  Left Hip Radiograph on 03/06/23: IMPRESSION: No acute osseous findings or significant arthropathic changes. Minimal degenerative changes of the hips and sacroiliac joints.  PATIENT SURVEYS:  Eval:  LEFS  30 / 80 = 37.5 % 03/30/2023:  Lower Extremity Functional Score: 43 / 80 = 53.8 %  COGNITION: Overall cognitive status: Within functional limits for tasks assessed     SENSATION: Patient denies numbness or tingling  MUSCLE LENGTH: Hamstrings: Tightness bilaterally, but L>R  POSTURE: rounded shoulders  PALPATION: Tender to palpation on left hip  LOWER EXTREMITY ROM:  Eval:  WFL, but some pain with left LE ROM  LOWER EXTREMITY MMT:  Eval: Left hip strength grossly 4/5  FUNCTIONAL TESTS:  Eval: 5 times sit to stand: 17.16 sec Single Leg Stance: Right 13.14  sec, Left 12.19 sec  03/30/2023: 5 times sit to stand:  13.02 sec 6 minute walk test:  1149 ft with pain increasing to 8/10 (pain started to increase at minute 2)  GAIT: Distance walked: >500 ft Assistive device utilized: None Level of assistance: Complete Independence Comments: Patient states that he gets some increased pain with walking, but overall, feels better when he walks                                                                                                                                TODAY'S TREATMENT   DATE: 04/25/2023 NuStep Level 5 x7 mins- PT present to discuss status Seated hamstring stretch 2x20 sec bilat Seated piriformis stretch 2x20 sec bilat Side stepping with yellow loop around ankles 3x10' bilat (cuing for improved posture/technique) Sit to/from stand holding 7# dumbbell x10 Supine straight leg raise with 2# weight 2x10  bilat Supine bridge 2x10 Sidelying clamshell 2x10 bilat Iontopatch with 4 hour wear time for 80 mA/min dose with 1mL of Dexamethasone   DATE:  04/20/2023 NuStep Level 5 x7 mins- PT present to discuss status Seated hamstring stretch 2x20 sec bilat Leg Press (seat at 7) 70# 2x10 Sit to/from stand holding 7# dumbbell x10 Side stepping with yellow loop around ankles 3x10' bilat Supine straight leg raise with 2# weight 2x10 bilat Supine bridge 2x10 Sidelying clamshell 2x10 bilat Prone hip extension with 2# ankle weight x10 bilat Iontopatch with 4 hour wear time for 80 mA/min dose with 1mL of Dexamethasone (pt provided with education about treatment and in agreement to utilize)   DATE: 04/12/2023 NuStep Level 5 x7 mins- PT present to discuss status Leg Press (seat at 7) 70# 2x10 Seated hamstring stretch 2x20 sec bilat Sit to/from stand holding 7# dumbbell x10 Seated modified dead lift with 7# dumbbell 2x10 Standing unilateral farmer's carry with 7# with high marching x10 with weight in each hand Sidelying clamshell 2x10 bilat Supine bent knee fall out 2x10 Supine straight leg raise 2x10 bilat Supine bridge 2x10    PATIENT EDUCATION:  Education details: Issued HEP Person educated: Patient Education method: Explanation, Demonstration, and Handouts Education comprehension: verbalized understanding and returned demonstration  HOME EXERCISE PROGRAM: Access Code: [email or web address] URL: [email or web address] Date: 03/14/2023 Prepared by: Claude Manges  Exercises - Seated Scapular Retraction  - 1 x daily - 7 x weekly - 2 sets - 10 reps - Seated Hamstring Stretch  - 1 x daily - 7 x weekly - 2 reps - 20 sec hold - Seated Sciatic Tensioner  - 1 x daily - 7 x weekly - 2 sets - 10 reps - Sit to Stand Without Arm Support  - 1 x daily - 7 x weekly - 2 sets - 10 reps - Hip Flexion Stretch  - 1 x daily - 7 x weekly - 2 sets - 30 hold - Supine Hip Internal and External Rotation  - 1 x daily  - 7 x weekly - 1 sets - 10 reps - Seated Piriformis Stretch with Trunk Bend  - 1 x daily - 7 x weekly - 2 sets - 30 hold - Supine Gluteus Stretch  - 1 x daily - 7 x weekly - 2 sets - 30 hold - Side Stepping with Resistance at Thighs  - 1 x daily - 7 x weekly - 3 sets - Hip Extension with Resistance Loop  - 1 x daily - 7 x weekly - 2 sets - 10 reps - Small Range Straight Leg Raise  - 1 x daily - 7 x weekly - 2 sets - 10 reps - Supine Bridge  - 1 x daily - 7 x weekly - 2 sets - 10 reps  ASSESSMENT:  CLINICAL IMPRESSION: Mr Gutridge presents to skilled PT reporting that he continues to have increased pain, but that the iontopatch may have helped some.  Patient does admit that he has not been as compliant with the exercise component of his HEP, but has mainly been only walking.  Patient educated on the importance of performing his exercises daily to allow him to better address his functional deficits.  Patient verbalizes understanding.  Patient had 2nd iontopatch applied at end of session today.  Patient requires cuing during session for increased core engagement to take strain off back during exercises with patient being able to return demonstration.  Patient continues to require skilled PT to progress towards goal related activities.    OBJECTIVE IMPAIRMENTS: decreased balance, difficulty walking, decreased strength, increased muscle spasms, impaired flexibility, postural dysfunction, and pain.   ACTIVITY LIMITATIONS: lifting, bending,  standing, squatting, and stairs  PARTICIPATION LIMITATIONS: cleaning, community activity, and yard work  PERSONAL FACTORS: Time since onset of injury/illness/exacerbation and 1-2 comorbidities: OA, Lumbar Fusion with reign cage  are also affecting patient's functional outcome.   REHAB POTENTIAL: Good  CLINICAL DECISION MAKING: Stable/uncomplicated  EVALUATION COMPLEXITY: Low   GOALS: Goals reviewed with patient? Yes  SHORT TERM GOALS: Target date:  03/24/2023 Patient will be independent with initial HEP. Baseline: Goal status: MET 03/14/2023  2.  Patient will participate in a 6 minute walk test to establish a baseline for distance and pain. Baseline:  Goal status: Met on 03/30/2023   LONG TERM GOALS: Target date: 05/05/2023  Patient will be independent with advanced HEP to allow for self-progression after discharge. Baseline:  Goal status: Ongoing  2.  Patient will be able to return to typical walking program and normal pace without increased pain. Baseline: Patient reports increased pain with walking with son and his dog Goal status: Ongoing  3.  Patient will increase Lower Extremity Functional Scale score to at least 55% to demonstrate improved functional mobility. Baseline: 37.5% Goal status: Met on 03/30/23  4.  Patient will increase bilateral single leg stance to greater than 30 seconds to decrease risk of falling. Baseline:  Goal status: Ongoing  5.  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 Baseline:  Goal status: Ongoing   PLAN:  PT FREQUENCY: 2x/week  PT DURATION: 8 weeks  PLANNED INTERVENTIONS: 97164- PT Re-evaluation, 97110-Therapeutic exercises, 97530- Therapeutic activity, [id number]- Neuromuscular re-education, 97535- Self Care, [id number]- Manual therapy, [id number]- Gait training, [phone]- Canalith repositioning, [id number]- Aquatic Therapy, 97014- Electrical stimulation (unattended), [id number]- Electrical stimulation (manual), [id number]- Vasopneumatic device, [id number]- Ultrasound, [id number]- Traction (mechanical), [id number]- Ionotophoresis 4mg /ml Dexamethasone, Patient/Family education, Balance training, Stair training, Taping, Dry Needling, Joint mobilization, Joint manipulation, Spinal manipulation, Spinal mobilization, Vestibular training, Cryotherapy, and Moist heat  PLAN FOR NEXT SESSION: continue hip strengthening and flexibility; hip matrix; manual/ DN as  indicated, balance, apply patch 3/6    Reather Laurence, PT, DPT 04/25/23, 3:41 PM  [facility] [address], [address] [place], [place] [address] Phone # [phone] Fax [phone]

## 2023-04-27 ENCOUNTER — Encounter: Payer: Managed Care, Other (non HMO) | Admitting: Rehabilitative and Restorative Service Providers"

## 2023-05-02 ENCOUNTER — Encounter: Payer: Managed Care, Other (non HMO) | Admitting: Rehabilitative and Restorative Service Providers"

## 2023-05-04 ENCOUNTER — Ambulatory Visit: Payer: Managed Care, Other (non HMO) | Admitting: Rehabilitative and Restorative Service Providers"

## 2023-05-04 ENCOUNTER — Encounter: Payer: Self-pay | Admitting: Rehabilitative and Restorative Service Providers"

## 2023-05-04 DIAGNOSIS — R2689 Other abnormalities of gait and mobility: Secondary | ICD-10-CM

## 2023-05-04 DIAGNOSIS — R252 Cramp and spasm: Secondary | ICD-10-CM

## 2023-05-04 DIAGNOSIS — M25552 Pain in left hip: Secondary | ICD-10-CM

## 2023-05-04 DIAGNOSIS — M6281 Muscle weakness (generalized): Secondary | ICD-10-CM

## 2023-05-04 NOTE — Therapy (Signed)
 OUTPATIENT PHYSICAL THERAPY TREATMENT NOTE AND REASSESSMENT NOTE   Patient Name: Seth Pope MRN: [id number] DOB:14-Mar-1950, 73 y.o., male Today's Date: 05/04/2023  Progress Note Reporting Period 03/08/2023 to 05/04/2023  See note below for Objective Data and Assessment of Progress/Goals.      END OF SESSION:  PT End of Session - 05/04/23 0935     Visit Number 10    Date for PT Re-Evaluation 06/30/23    Authorization Type [employer]    Progress Note Due on Visit 20    PT Start Time 0930    PT Stop Time 1010    PT Time Calculation (min) 40 min    Activity Tolerance Patient tolerated treatment well    Behavior During Therapy WFL for tasks assessed/performed                Past Medical History:  Diagnosis Date   Anxiety    Arthritis    hands   CAD (coronary artery disease)    a. cath 10/23/13: nonobstructive CAD, nl LV function, rec risk factor modification   Chronic kidney disease    h/o kidney stone - passed stone   Diabetes mellitus    type 2   History of pneumonia    Hyperlipidemia    Hypertension    Liver mass    Sinus bradycardia    a. as low as 36 bpm; b. multiple pauses 2.1-2.3 seconds   Ulcer    Umbilical hernia    Past Surgical History:  Procedure Laterality Date   BACK SURGERY     CERVICAL SPINE SURGERY     COLONOSCOPY  2015   Dr Loreta Ave 3 TAs, tics, hems   INGUINAL HERNIA REPAIR Bilateral 09/16/2019   Procedure: LAPAROSCOPIC BILATERAL INGUINAL HERNIA;  Surgeon: Luretha Murphy, MD;  Location: [facility];  Service: General;  Laterality: Bilateral;   KNEE SURGERY Right 1970   LEFT HEART CATHETERIZATION WITH CORONARY ANGIOGRAM N/A 10/23/2013   Procedure: LEFT HEART CATHETERIZATION WITH CORONARY ANGIOGRAM;  Surgeon: Peter M Swaziland, MD;  Location: [facility] CATH LAB;  Service: Cardiovascular;  Laterality: N/A;   MICROLARYNGOSCOPY N/A 09/23/2015   Procedure: MICROLARYNGOSCOPY REMOVAL OF LESION;  Surgeon: Serena Colonel, MD;  Location: [facility] OR;  Service: ENT;  Laterality: N/A;    NM MYOCAR PERF WALL MOTION  08/2010   bruce myoview - normal perfusion in all regions, EF 66%, EKG negative for ischemia, no wall motion abnormalities, normal/low risk study                                        ROTATOR CUFF REPAIR Bilateral 1991   x2   UMBILICAL HERNIA REPAIR  1988   Patient Active Problem List   Diagnosis Date Noted   Left hip pain 03/06/2023   Generalized anxiety disorder 03/02/2021   Family history of coronary artery disease in father 10/23/2013   Tobacco abuse 10/17/2013   Neuropathy, diabetic (HCC) 01/31/2012   Hypertension associated with diabetes (HCC)    Dyslipidemia associated with type 2 diabetes mellitus (HCC)    Hyperlipidemia     PCP: Georgina Quint, MD  REFERRING PROVIDER: Rodolph Bong, MD  REFERRING DIAG: [phone] (ICD-10-CM) - Left hip pain  THERAPY DIAG:  Pain in left hip - Plan: PT plan of care cert/re-cert  Muscle weakness (generalized) - Plan: PT plan of care cert/re-cert  Other abnormalities of gait and mobility - Plan:  PT plan of care cert/re-cert  Cramp and spasm - Plan: PT plan of care cert/re-cert  Rationale for Evaluation and Treatment: Rehabilitation  ONSET DATE: October 2024  SUBJECTIVE:   SUBJECTIVE STATEMENT: Patient states that the 2nd ionto patch seemed to help more than the first one.  Patient states that he had a couple of days without any pain.  Patient states that he is going to try to just go to the gym independent.  PERTINENT HISTORY: OA, Anxiety, Diabetes, CAD, HTN, Hx of lumbar fusion with reign cage in low back by Dr Danielle Dess  PAIN:  Are you having pain? Yes: NPRS scale: 0-3/10 Pain location: Left hip Pain description: aching, sore Aggravating factors: worse in the morning, sometimes wakes him up hurting Relieving factors: sometimes rest and sometimes cold pack, but uncertain  PRECAUTIONS: None  RED FLAGS: None   WEIGHT BEARING RESTRICTIONS: No  FALLS:  Has patient fallen in last 6 months?  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  NEXT MD VISIT: Dr Denyse Amass on 04/19/23  OBJECTIVE:  Note: Objective measures were completed at Evaluation unless otherwise noted.  DIAGNOSTIC FINDINGS:  Left Hip Radiograph on 03/06/23: IMPRESSION: No acute osseous findings or significant arthropathic changes. Minimal degenerative changes of the hips and sacroiliac joints.  PATIENT SURVEYS:  Eval:  LEFS  30 / 80 = 37.5 % 03/30/2023:  Lower Extremity Functional Score: 43 / 80 = 53.8 % 05/04/2023:  Lower Extremity Functional Score: 58 / 80 = 72.5 %  COGNITION: Overall cognitive status: Within functional limits for tasks assessed     SENSATION: Patient denies numbness or tingling  MUSCLE LENGTH: Hamstrings: Tightness bilaterally, but L>R  POSTURE: rounded shoulders  PALPATION: Tender to palpation on left hip  LOWER EXTREMITY ROM:  Eval:  WFL, but some pain with left LE ROM  LOWER EXTREMITY MMT:  Eval: Left hip strength grossly 4/5  FUNCTIONAL TESTS:  Eval: 5 times sit to stand: 17.16 sec Single Leg Stance: Right 13.14  sec, Left 12.19 sec  03/30/2023: 5 times sit to stand:  13.02 sec 6 minute walk test:  1149 ft with pain increasing to 8/10 (pain started to increase at minute 2)  05/04/2023: Single Leg Stance: Right 19.97 sec, Left 29.35 sec  GAIT: Distance walked: >500 ft Assistive device utilized: None Level of assistance: Complete Independence Comments: Patient states that he gets some increased pain with walking, but overall, feels better when he walks  TODAY'S TREATMENT   DATE: 05/04/2023 NuStep Level 5 x7 mins- PT present to discuss status Lower Extremity Functional Score: 58 / 80 = 72.5 % Seated hamstring stretch 2x20 sec bilat Seated piriformis stretch 2x20 sec bilat Single leg stance in parallel bars (with UE support, as needed) 2x30 sec bilat Tandem stance in parallel bars (with UE support, as needed) 2x30 sec bilat Sidelying clamshell 2x10 bilat   DATE: 04/25/2023 NuStep Level 5 x7 mins- PT present to discuss status Seated hamstring stretch 2x20 sec bilat Seated piriformis stretch 2x20 sec bilat Side stepping with yellow loop around ankles 3x10' bilat (cuing for improved posture/technique) Sit to/from stand holding 7# dumbbell x10 Supine straight leg raise with 2# weight 2x10 bilat Supine bridge 2x10 Sidelying clamshell 2x10 bilat Iontopatch with 4 hour wear time for 80 mA/min dose with 1mL of Dexamethasone   DATE: 04/20/2023 NuStep Level 5 x7 mins- PT present to discuss status Seated hamstring stretch 2x20 sec bilat Leg Press (seat at 7) 70# 2x10 Sit to/from stand holding 7# dumbbell x10 Side stepping with yellow loop around ankles 3x10' bilat Supine straight leg raise with 2# weight 2x10 bilat Supine bridge 2x10 Sidelying clamshell 2x10 bilat Prone hip extension with 2# ankle weight x10 bilat Iontopatch with 4 hour wear time for 80 mA/min dose with 1mL of Dexamethasone (pt provided with education about treatment and in agreement to utilize)    PATIENT EDUCATION:  Education details: Issued HEP Person educated: Patient Education method: Programmer, multimedia, Facilities manager, and Handouts Education comprehension: verbalized understanding and returned demonstration  HOME EXERCISE PROGRAM: Access Code: [email or web address] URL: [email or web address] Date: 05/04/2023 Prepared by: Clydie Braun Shailynn Fong  Exercises - Seated Hamstring Stretch  - 1 x daily - 7 x weekly  - 2 reps - 20 sec hold - Seated Piriformis Stretch with Trunk Bend  - 1 x daily - 7 x weekly - 2 sets - 30 hold - Seated Sciatic Tensioner  - 1 x daily - 7 x weekly - 2 sets - 10 reps - Sit to Stand Without Arm Support  - 1 x daily - 7 x weekly - 2 sets - 10 reps - Hip Flexion Stretch  - 1 x daily - 7 x weekly - 2 sets - 30 hold - Supine Hip Internal and External Rotation  - 1 x daily - 7 x weekly - 1 sets - 10 reps - Supine Gluteus Stretch  - 1 x daily - 7 x weekly - 2 sets - 30 hold - Small Range Straight Leg Raise  - 1 x daily - 7 x weekly - 2 sets - 10 reps - Supine Bridge  - 1 x daily - 7 x weekly - 2 sets - 10 reps - Clamshell  - 1 x daily - 7 x weekly - 2 sets - 10 reps - Side Stepping with Resistance at Thighs  - 1 x daily - 7 x weekly - 3 sets - Hip Extension with Resistance Loop  - 1 x daily - 7 x weekly - 2 sets - 10 reps - Single Leg Balance in March Position  - 1 x daily - 7 x weekly - 2 reps - 30 sec hold - Tandem Stance  - 1 x daily - 7 x weekly - 2 reps - 30 sec hold  ASSESSMENT:  CLINICAL IMPRESSION: Mr Riggenbach presents to skilled PT reporting that he is starting to feel better, but feels that he still would benefit from continued PT to continue to ensure that he is progressing  towards goals and progressing his balance and decreased pain.  Patient with improved score on Lower Extremity Functional Scale today.  Additionally, he has improved with his single leg balance, but has not yet met goal.  Patient with good muscle recruitment on clamshell, so added that to HEP.  Patient would benefit from continued skilled PT of once every other week for 8 weeks to further progress towards goal related activities.    OBJECTIVE IMPAIRMENTS: decreased balance, difficulty walking, decreased strength, increased muscle spasms, impaired flexibility, postural dysfunction, and pain.   ACTIVITY LIMITATIONS: lifting, bending, standing, squatting, and stairs  PARTICIPATION LIMITATIONS: cleaning,  community activity, and yard work  PERSONAL FACTORS: Time since onset of injury/illness/exacerbation and 1-2 comorbidities: OA, Lumbar Fusion with reign cage  are also affecting patient's functional outcome.   REHAB POTENTIAL: Good  CLINICAL DECISION MAKING: Stable/uncomplicated  EVALUATION COMPLEXITY: Low   GOALS: Goals reviewed with patient? Yes  SHORT TERM GOALS: Target date: 03/24/2023 Patient will be independent with initial HEP. Baseline: Goal status: MET 03/14/2023  2.  Patient will participate in a 6 minute walk test to establish a baseline for distance and pain. Baseline:  Goal status: Met on 03/30/2023   LONG TERM GOALS: Target date: 06/30/2023  Patient will be independent with advanced HEP to allow for self-progression after discharge. Baseline:  Goal status: Ongoing  2.  Patient will be able to return to typical walking program and normal pace without increased pain. Baseline: Patient reports increased pain with walking with son and his dog Goal status: Ongoing  3.  Patient will increase Lower Extremity Functional Scale score to at least 55% to demonstrate improved functional mobility. Baseline: 37.5% Goal status: Met on 03/30/23  4.  Patient will increase bilateral single leg stance to greater than 30 seconds to decrease risk of falling. Baseline:  Goal status: Ongoing  5.  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 Baseline:  Goal status: Met on 05/04/23   PLAN:  PT FREQUENCY: 2x/week  PT DURATION: 8 weeks  PLANNED INTERVENTIONS: 97164- PT Re-evaluation, 97110-Therapeutic exercises, 97530- Therapeutic activity, 97112- Neuromuscular re-education, 97535- Self Care, 86578- Manual therapy, [phone]- Gait training, [phone]- Canalith repositioning, [id number]- Aquatic Therapy, 97014- Electrical stimulation (unattended), [phone]- Electrical stimulation (manual), 97016- Vasopneumatic device,  [id number]- Ultrasound, [id number]- Traction (mechanical), [id number]- Ionotophoresis 4mg /ml Dexamethasone, Patient/Family education, Balance training, Stair training, Taping, Dry Needling, Joint mobilization, Joint manipulation, Spinal manipulation, Spinal mobilization, Vestibular training, Cryotherapy, and Moist heat  PLAN FOR NEXT SESSION: continue hip strengthening and flexibility; hip matrix; manual/ DN as indicated, balance, apply patch 3/6 if needed    Reather Laurence, PT, DPT 05/04/23, 10:25 AM  [facility] [address] [place], [place] [address] Phone # [phone] Fax [phone]

## 2023-05-16 ENCOUNTER — Encounter: Payer: Self-pay | Admitting: Rehabilitative and Restorative Service Providers"

## 2023-05-16 ENCOUNTER — Ambulatory Visit: Attending: Family Medicine | Admitting: Rehabilitative and Restorative Service Providers"

## 2023-05-16 DIAGNOSIS — R252 Cramp and spasm: Secondary | ICD-10-CM | POA: Diagnosis present

## 2023-05-16 DIAGNOSIS — R2689 Other abnormalities of gait and mobility: Secondary | ICD-10-CM | POA: Diagnosis present

## 2023-05-16 DIAGNOSIS — M25552 Pain in left hip: Secondary | ICD-10-CM | POA: Diagnosis present

## 2023-05-16 DIAGNOSIS — M6281 Muscle weakness (generalized): Secondary | ICD-10-CM | POA: Insufficient documentation

## 2023-05-16 NOTE — Therapy (Signed)
 OUTPATIENT PHYSICAL THERAPY TREATMENT NOTE   Patient Name: Seth Pope MRN: [id number] DOB:09-15-1950, 73 y.o., male Today's Date: 05/16/2023   END OF SESSION:  PT End of Session - 05/16/23 0852     Visit Number 11    Date for PT Re-Evaluation 06/30/23    Authorization Type [employer]    Progress Note Due on Visit 20    PT Start Time 0850    PT Stop Time 0930    PT Time Calculation (min) 40 min    Activity Tolerance Patient tolerated treatment well    Behavior During Therapy [facility] for tasks assessed/performed                Past Medical History:  Diagnosis Date   Anxiety    Arthritis    hands   CAD (coronary artery disease)    a. cath 10/23/13: nonobstructive CAD, nl LV function, rec risk factor modification   Chronic kidney disease    h/o kidney stone - passed stone   Diabetes mellitus    type 2   History of pneumonia    Hyperlipidemia    Hypertension    Liver mass    Sinus bradycardia    a. as low as 36 bpm; b. multiple pauses 2.1-2.3 seconds   Ulcer    Umbilical hernia    Past Surgical History:  Procedure Laterality Date   BACK SURGERY     CERVICAL SPINE SURGERY     COLONOSCOPY  2015   Dr Loreta Ave 3 TAs, tics, hems   INGUINAL HERNIA REPAIR Bilateral 09/16/2019   Procedure: LAPAROSCOPIC BILATERAL INGUINAL HERNIA;  Surgeon: Luretha Murphy, MD;  Location: [facility];  Service: General;  Laterality: Bilateral;   KNEE SURGERY Right 1970   LEFT HEART CATHETERIZATION WITH CORONARY ANGIOGRAM N/A 10/23/2013   Procedure: LEFT HEART CATHETERIZATION WITH CORONARY ANGIOGRAM;  Surgeon: Peter M Swaziland, MD;  Location: [facility] CATH LAB;  Service: Cardiovascular;  Laterality: N/A;   MICROLARYNGOSCOPY N/A 09/23/2015   Procedure: MICROLARYNGOSCOPY REMOVAL OF LESION;  Surgeon: Serena Colonel, MD;  Location: [facility] OR;  Service: ENT;  Laterality: N/A;   NM MYOCAR PERF WALL MOTION  08/2010   bruce myoview - normal perfusion in all regions, EF 66%, EKG negative for ischemia, no wall motion abnormalities,  normal/low risk study                                        ROTATOR CUFF REPAIR Bilateral 1991   x2   UMBILICAL HERNIA REPAIR  1988   Patient Active Problem List   Diagnosis Date Noted   Left hip pain 03/06/2023   Generalized anxiety disorder 03/02/2021   Family history of coronary artery disease in father 10/23/2013   Tobacco abuse 10/17/2013   Neuropathy, diabetic (HCC) 01/31/2012   Hypertension associated with diabetes (HCC)    Dyslipidemia associated with type 2 diabetes mellitus (HCC)    Hyperlipidemia     PCP: Georgina Quint, MD  REFERRING PROVIDER: Rodolph Bong, MD  REFERRING DIAG: [phone] (ICD-10-CM) - Left hip pain  THERAPY DIAG:  Pain in left hip  Muscle weakness (generalized)  Other abnormalities of gait and mobility  Cramp and spasm  Rationale for Evaluation and Treatment: Rehabilitation  ONSET DATE: October 2024  SUBJECTIVE:   SUBJECTIVE STATEMENT: Patient states that he has had some days where he did not have any hip pain.  However, patient  states that he did do some heavy lifting and strained his back on Sunday.  PERTINENT HISTORY: OA, Anxiety, Diabetes, CAD, HTN, Hx of lumbar fusion with reign cage in low back by Dr Danielle Dess  PAIN:  Are you having pain? Yes: NPRS scale: 5-6/10 Pain location: Low back Pain description: aching, sore Aggravating factors: worse in the morning, sometimes wakes him up hurting Relieving factors: sometimes rest and sometimes cold pack, but uncertain  PRECAUTIONS: None  RED FLAGS: None   WEIGHT BEARING RESTRICTIONS: No  FALLS:  Has patient fallen in last 6 months? 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  NEXT MD VISIT: Dr Denyse Amass on 04/19/23  OBJECTIVE:  Note: Objective measures were completed at Evaluation unless otherwise noted.  DIAGNOSTIC FINDINGS:  Left Hip Radiograph on 03/06/23: IMPRESSION: No acute osseous findings or significant arthropathic changes. Minimal degenerative changes of the hips and sacroiliac joints.  PATIENT SURVEYS:  Eval:  LEFS  30 / 80 = 37.5 % 03/30/2023:  Lower Extremity Functional Score: 43 / 80 = 53.8 % 05/04/2023:  Lower Extremity Functional Score: 58 / 80 = 72.5 %  COGNITION: Overall cognitive status: Within functional limits for tasks assessed     SENSATION: Patient denies numbness or tingling  MUSCLE LENGTH: Hamstrings: Tightness bilaterally, but L>R  POSTURE: rounded shoulders  PALPATION: Tender to palpation on left hip  LOWER EXTREMITY ROM:  Eval:  WFL, but some pain with left LE ROM  LOWER EXTREMITY MMT:  Eval: Left hip strength grossly 4/5  FUNCTIONAL TESTS:  Eval: 5 times sit to stand: 17.16 sec Single Leg Stance: Right 13.14  sec, Left 12.19 sec  03/30/2023: 5 times sit to stand:  13.02 sec 6 minute walk test:  1149 ft with pain increasing to 8/10 (pain started to increase at minute 2)  05/04/2023: Single Leg Stance: Right 19.97 sec, Left 29.35 sec  GAIT: Distance walked: >500 ft Assistive device utilized: None Level of assistance: Complete Independence Comments: Patient states that he gets some increased pain with walking, but overall, feels better when he walks                                                                                                                                TODAY'S TREATMENT   DATE: 05/16/2023 Recumbent bike level 3 x6 min with PT present to discuss status Seated hamstring stretch 2x20 sec bilat Seated piriformis stretch 2x20 sec bilat Seated 3 way blue pball rollout 3x10 sec each Sit to/from stand holding 7# dumbbell  x10 Farmer's carry holding 7# dumbbell performing high marching x10 each with weight  switching hands Supine transversus abdominus contraction pressing ball into thighs 2x10 Supine clamshell with yellow loop 2x10 Supine marching with yellow loop around lower thighs 2x10 Supine partial bridges x10 Supine single knee to chest 2x10 sec   DATE: 05/04/2023 NuStep Level 5 x7 mins- PT present to discuss status Lower Extremity Functional Score: 58 / 80 = 72.5 % Seated hamstring stretch 2x20 sec bilat Seated piriformis stretch 2x20 sec bilat Single leg stance in parallel bars (with UE support, as needed) 2x30 sec bilat Tandem stance in parallel bars (with UE support, as needed) 2x30 sec bilat Sidelying clamshell 2x10 bilat   DATE: 04/25/2023 NuStep Level 5 x7 mins- PT present to discuss status Seated hamstring stretch 2x20 sec bilat Seated piriformis stretch 2x20 sec bilat Side stepping with yellow loop around ankles 3x10' bilat (cuing for improved posture/technique) Sit to/from stand holding 7# dumbbell x10 Supine straight leg raise with 2# weight 2x10 bilat Supine bridge 2x10 Sidelying clamshell 2x10 bilat Iontopatch with 4 hour wear time for 80 mA/min dose with 1mL of Dexamethasone    PATIENT EDUCATION:  Education details: Issued HEP Person educated: Patient Education method: Explanation, Demonstration, and Handouts Education comprehension: verbalized understanding and returned demonstration  HOME EXERCISE PROGRAM: Access Code: [email or web address] URL: [email or web address] Date: 05/04/2023 Prepared by: Clydie Braun Jasreet Dickie  Exercises - Seated Hamstring Stretch  - 1 x daily - 7 x weekly - 2 reps - 20 sec hold - Seated Piriformis Stretch with Trunk Bend  - 1 x daily - 7 x weekly - 2 sets - 30 hold - Seated Sciatic Tensioner  - 1 x daily - 7 x weekly - 2 sets - 10 reps - Sit to Stand Without Arm Support  - 1 x daily - 7 x weekly - 2 sets - 10 reps - Hip Flexion Stretch  - 1 x daily  - 7 x weekly - 2 sets - 30 hold - Supine Hip Internal and External Rotation  - 1 x daily - 7 x weekly - 1 sets - 10 reps - Supine Gluteus Stretch  - 1 x daily - 7 x weekly - 2 sets - 30 hold - Small Range Straight Leg Raise  - 1 x daily - 7 x weekly - 2 sets - 10 reps - Supine Bridge  - 1 x daily - 7 x weekly - 2 sets - 10 reps - Clamshell  - 1 x daily - 7 x weekly - 2 sets - 10 reps - Side Stepping with Resistance at Thighs  - 1 x daily - 7 x weekly - 3 sets - Hip Extension with Resistance Loop  - 1 x daily - 7 x weekly - 2 sets - 10 reps - Single Leg Balance in March Position  - 1 x daily - 7 x weekly - 2 reps - 30 sec hold - Tandem Stance  - 1 x daily - 7 x weekly - 2 reps - 30 sec hold  ASSESSMENT:  CLINICAL IMPRESSION: Mr Gronau presents to skilled PT reporting that he is not having hip pain like he way, but he did strain his low back on Sunday and it is still hurting him.  Educated pt on how his back, hips, abdominals, and pelvic muscles all work together and with strengthening and stretching the appropriate muscles, it will help him with decreased pain.  Patient reports less pain/strain in his back with core engagement exercises.  Patient educated on improved technique during all exercises to improve his form and allow him  have decreased strain on his bike.  Patient continues to require skilled PT to progress towards goal related activities.    OBJECTIVE IMPAIRMENTS: decreased balance, difficulty walking, decreased strength, increased muscle spasms, impaired flexibility, postural dysfunction, and pain.   ACTIVITY LIMITATIONS: lifting, bending, standing, squatting, and stairs  PARTICIPATION LIMITATIONS: cleaning, community activity, and yard work  PERSONAL FACTORS: Time since onset of injury/illness/exacerbation and 1-2 comorbidities: OA, Lumbar Fusion with reign cage  are also affecting patient's functional outcome.   REHAB POTENTIAL: Good  CLINICAL DECISION MAKING:  Stable/uncomplicated  EVALUATION COMPLEXITY: Low   GOALS: Goals reviewed with patient? Yes  SHORT TERM GOALS: Target date: 03/24/2023 Patient will be independent with initial HEP. Baseline: Goal status: MET 03/14/2023  2.  Patient will participate in a 6 minute walk test to establish a baseline for distance and pain. Baseline:  Goal status: Met on 03/30/2023   LONG TERM GOALS: Target date: 06/30/2023  Patient will be independent with advanced HEP to allow for self-progression after discharge. Baseline:  Goal status: Ongoing  2.  Patient will be able to return to typical walking program and normal pace without increased pain. Baseline: Patient reports increased pain with walking with son and his dog Goal status: Ongoing  3.  Patient will increase Lower Extremity Functional Scale score to at least 55% to demonstrate improved functional mobility. Baseline: 37.5% Goal status: Met on 03/30/23  4.  Patient will increase bilateral single leg stance to greater than 30 seconds to decrease risk of falling. Baseline:  Goal status: Ongoing  5.  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 Baseline:  Goal status: Met on 05/04/23   PLAN:  PT FREQUENCY: 2x/week  PT DURATION: 8 weeks  PLANNED INTERVENTIONS: 97164- PT Re-evaluation, 97110-Therapeutic exercises, 97530- Therapeutic activity, 97112- Neuromuscular re-education, 97535- Self Care, 78295- Manual therapy, [id number]- Gait training, [phone]- Canalith repositioning, [id number]- Aquatic Therapy, 97014- Electrical stimulation (unattended), [phone]- Electrical stimulation (manual), 97016- Vasopneumatic device, [id number]- Ultrasound, [id number]- Traction (mechanical), [id number]- Ionotophoresis 4mg /ml Dexamethasone, Patient/Family education, Balance training, Stair training, Taping, Dry Needling, Joint mobilization, Joint manipulation, Spinal manipulation, Spinal mobilization, Vestibular  training, Cryotherapy, and Moist heat  PLAN FOR NEXT SESSION: continue hip strengthening and flexibility; hip matrix; manual/ DN as indicated, balance, apply patch 3/6 if needed    Reather Laurence, PT, DPT 05/16/23, 10:17 AM  [facility] [address], [address] [place], [place] [address] Phone # [phone] Fax [phone]

## 2023-05-30 ENCOUNTER — Encounter: Payer: Self-pay | Admitting: Rehabilitative and Restorative Service Providers"

## 2023-05-30 ENCOUNTER — Ambulatory Visit: Admitting: Rehabilitative and Restorative Service Providers"

## 2023-05-30 DIAGNOSIS — M25552 Pain in left hip: Secondary | ICD-10-CM | POA: Diagnosis not present

## 2023-05-30 DIAGNOSIS — R2689 Other abnormalities of gait and mobility: Secondary | ICD-10-CM

## 2023-05-30 DIAGNOSIS — M6281 Muscle weakness (generalized): Secondary | ICD-10-CM

## 2023-05-30 DIAGNOSIS — R252 Cramp and spasm: Secondary | ICD-10-CM

## 2023-05-30 NOTE — Therapy (Signed)
 OUTPATIENT PHYSICAL THERAPY TREATMENT NOTE   Patient Name: Seth Pope MRN: [id number] DOB:1950-04-03, 73 y.o., male Today's Date: 05/30/2023   END OF SESSION:  PT End of Session - 05/30/23 0852     Visit Number 12    Date for PT Re-Evaluation 06/30/23    Authorization Type [employer]    Progress Note Due on Visit 20    PT Start Time 0850    PT Stop Time 0930    PT Time Calculation (min) 40 min    Activity Tolerance Patient tolerated treatment well    Behavior During Therapy [facility] for tasks assessed/performed                Past Medical History:  Diagnosis Date   Anxiety    Arthritis    hands   CAD (coronary artery disease)    a. cath 10/23/13: nonobstructive CAD, nl LV function, rec risk factor modification   Chronic kidney disease    h/o kidney stone - passed stone   Diabetes mellitus    type 2   History of pneumonia    Hyperlipidemia    Hypertension    Liver mass    Sinus bradycardia    a. as low as 36 bpm; b. multiple pauses 2.1-2.3 seconds   Ulcer    Umbilical hernia    Past Surgical History:  Procedure Laterality Date   BACK SURGERY     CERVICAL SPINE SURGERY     COLONOSCOPY  2015   Dr Tova Fresh 3 TAs, tics, hems   INGUINAL HERNIA REPAIR Bilateral 09/16/2019   Procedure: LAPAROSCOPIC BILATERAL INGUINAL HERNIA;  Surgeon: Jacolyn Matar, MD;  Location: [facility];  Service: General;  Laterality: Bilateral;   KNEE SURGERY Right 1970   LEFT HEART CATHETERIZATION WITH CORONARY ANGIOGRAM N/A 10/23/2013   Procedure: LEFT HEART CATHETERIZATION WITH CORONARY ANGIOGRAM;  Surgeon: Peter M Swaziland, MD;  Location: [facility] CATH LAB;  Service: Cardiovascular;  Laterality: N/A;   MICROLARYNGOSCOPY N/A 09/23/2015   Procedure: MICROLARYNGOSCOPY REMOVAL OF LESION;  Surgeon: Janita Mellow, MD;  Location: [facility] OR;  Service: ENT;  Laterality: N/A;   NM MYOCAR PERF WALL MOTION  08/2010   bruce myoview - normal perfusion in all regions, EF 66%, EKG negative for ischemia, no wall motion abnormalities,  normal/low risk study                                        ROTATOR CUFF REPAIR Bilateral 1991   x2   UMBILICAL HERNIA REPAIR  1988   Patient Active Problem List   Diagnosis Date Noted   Left hip pain 03/06/2023   Generalized anxiety disorder 03/02/2021   Family history of coronary artery disease in father 10/23/2013   Tobacco abuse 10/17/2013   Neuropathy, diabetic (HCC) 01/31/2012   Hypertension associated with diabetes (HCC)    Dyslipidemia associated with type 2 diabetes mellitus (HCC)    Hyperlipidemia     PCP: Elvira Hammersmith, MD  REFERRING PROVIDER: Syliva Even, MD  REFERRING DIAG: [phone] (ICD-10-CM) - Left hip pain  THERAPY DIAG:  Pain in left hip  Muscle weakness (generalized)  Other abnormalities of gait and mobility  Cramp and spasm  Rationale for Evaluation and Treatment: Rehabilitation  ONSET DATE: October 2024  SUBJECTIVE:   SUBJECTIVE STATEMENT: Patient states that there have been several days since his last PT visit that he has not noticed any  pain.  States that he was able to stand at church for over an hour to greet people and did not have any increased pain.  PERTINENT HISTORY: OA, Anxiety, Diabetes, CAD, HTN, Hx of lumbar fusion with reign cage in low back by Dr Ellery Guthrie  PAIN:  Are you having pain? Yes: NPRS scale: 0-4/10 Pain location: Low back Pain description: aching, sore Aggravating factors: worse in the morning, sometimes wakes him up hurting Relieving factors: sometimes rest and sometimes cold pack, but uncertain  PRECAUTIONS: None  RED FLAGS: None   WEIGHT BEARING RESTRICTIONS: No  FALLS:  Has patient fallen in last 6 months? 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  NEXT MD VISIT: Dr Alease Hunter on 04/19/23  OBJECTIVE:  Note: Objective measures were completed at Evaluation unless otherwise noted.  DIAGNOSTIC FINDINGS:  Left Hip Radiograph on 03/06/23: IMPRESSION: No acute osseous findings or significant arthropathic changes. Minimal degenerative changes of the hips and sacroiliac joints.  PATIENT SURVEYS:  Eval:  LEFS  30 / 80 = 37.5 % 03/30/2023:  Lower Extremity Functional Score: 43 / 80 = 53.8 % 05/04/2023:  Lower Extremity Functional Score: 58 / 80 = 72.5 %  COGNITION: Overall cognitive status: Within functional limits for tasks assessed     SENSATION: Patient denies numbness or tingling  MUSCLE LENGTH: Hamstrings: Tightness bilaterally, but L>R  POSTURE: rounded shoulders  PALPATION: Tender to palpation on left hip  LOWER EXTREMITY ROM:  Eval:  WFL, but some pain with left LE ROM  LOWER EXTREMITY MMT:  Eval: Left hip strength grossly 4/5  FUNCTIONAL TESTS:  Eval: 5 times sit to stand: 17.16 sec Single Leg Stance: Right 13.14  sec, Left 12.19 sec  03/30/2023: 5 times sit to stand:  13.02 sec 6 minute walk test:  1149 ft with pain increasing to 8/10 (pain started to increase at minute 2)  05/04/2023: Single Leg Stance: Right 19.97 sec, Left 29.35 sec  05/30/2023: 6 minute walk test:  1415 ft with pain increasing from a 4/10 to a 5/10 from start to finish  GAIT: Distance walked: >500 ft Assistive device utilized: None Level of assistance: Complete Independence Comments: Patient states that he gets some increased pain with walking, but overall, feels better when he walks                                                                                                                                TODAY'S TREATMENT   DATE: 05/30/2023 Recumbent bike level 3 x7 min with PT present to discuss status Seated hamstring  stretch 2x20 sec bilat Seated  piriformis stretch 2x20 sec bilat 6 minute walk test:  1415 ft with pain increasing from a 4/10 to a 5/10 Sit to/from stand holding 7# dumbbell x10 Farmer's carry holding 7# dumbbell performing high marching x10 each with weight switching hands Side stepping with yellow loop 3x10 ft bilat Single leg stance and reaching to cone on mat 2x10 bilat   DATE: 05/16/2023 Recumbent bike level 3 x6 min with PT present to discuss status Seated hamstring stretch 2x20 sec bilat Seated piriformis stretch 2x20 sec bilat Seated 3 way blue pball rollout 3x10 sec each Sit to/from stand holding 7# dumbbell x10 Farmer's carry holding 7# dumbbell performing high marching x10 each with weight switching hands Supine transversus abdominus contraction pressing ball into thighs 2x10 Supine clamshell with yellow loop 2x10 Supine marching with yellow loop around lower thighs 2x10 Supine partial bridges x10 Supine single knee to chest 2x10 sec   DATE: 05/04/2023 NuStep Level 5 x7 mins- PT present to discuss status Lower Extremity Functional Score: 58 / 80 = 72.5 % Seated hamstring stretch 2x20 sec bilat Seated piriformis stretch 2x20 sec bilat Single leg stance in parallel bars (with UE support, as needed) 2x30 sec bilat Tandem stance in parallel bars (with UE support, as needed) 2x30 sec bilat Sidelying clamshell 2x10 bilat   PATIENT EDUCATION:  Education details: Issued HEP Person educated: Patient Education method: Explanation, Demonstration, and Handouts Education comprehension: verbalized understanding and returned demonstration  HOME EXERCISE PROGRAM: Access Code: [email or web address] URL: [email or web address] Date: 05/04/2023 Prepared by: Chaneta Comer Amaiah Cristiano  Exercises - Seated Hamstring Stretch  - 1 x daily - 7 x weekly - 2 reps - 20 sec hold - Seated Piriformis Stretch with Trunk Bend  - 1 x daily - 7 x weekly - 2 sets - 30 hold - Seated Sciatic Tensioner  - 1 x  daily - 7 x weekly - 2 sets - 10 reps - Sit to Stand Without Arm Support  - 1 x daily - 7 x weekly - 2 sets - 10 reps - Hip Flexion Stretch  - 1 x daily - 7 x weekly - 2 sets - 30 hold - Supine Hip Internal and External Rotation  - 1 x daily - 7 x weekly - 1 sets - 10 reps - Supine Gluteus Stretch  - 1 x daily - 7 x weekly - 2 sets - 30 hold - Small Range Straight Leg Raise  - 1 x daily - 7 x weekly - 2 sets - 10 reps - Supine Bridge  - 1 x daily - 7 x weekly - 2 sets - 10 reps - Clamshell  - 1 x daily - 7 x weekly - 2 sets - 10 reps - Side Stepping with Resistance at Thighs  - 1 x daily - 7 x weekly - 3 sets - Hip Extension with Resistance Loop  - 1 x daily - 7 x weekly - 2 sets - 10 reps - Single Leg Balance in March Position  - 1 x daily - 7 x weekly - 2 reps - 30 sec hold - Tandem Stance  - 1 x daily - 7 x weekly - 2 reps - 30 sec hold  ASSESSMENT:  CLINICAL IMPRESSION: Mr Rask presents to skilled PT reporting that he is noticing more times when he is not having any pain.  Patient able to complete 6 minute walk test today with significant improvements in pain and increased distance noted.  Able to walk outside without any loss of balance today.  Patient able to progress with balance and performing more single leg stance activities today with dynamic movement.  Patient continues to progress towards goal related activities.    OBJECTIVE IMPAIRMENTS: decreased balance, difficulty walking, decreased strength, increased muscle spasms, impaired flexibility, postural dysfunction, and pain.   ACTIVITY LIMITATIONS: lifting, bending, standing, squatting, and stairs  PARTICIPATION LIMITATIONS: cleaning, community activity, and yard work  PERSONAL FACTORS: Time since onset of injury/illness/exacerbation and 1-2 comorbidities: OA, Lumbar Fusion with reign cage  are also affecting patient's functional outcome.   REHAB POTENTIAL: Good  CLINICAL DECISION MAKING: Stable/uncomplicated  EVALUATION  COMPLEXITY: Low   GOALS: Goals reviewed with patient? Yes  SHORT TERM GOALS: Target date: 03/24/2023 Patient will be independent with initial HEP. Baseline: Goal status: MET 03/14/2023  2.  Patient will participate in a 6 minute walk test to establish a baseline for distance and pain. Baseline:  Goal status: Met on 03/30/2023   LONG TERM GOALS: Target date: 06/30/2023  Patient will be independent with advanced HEP to allow for self-progression after discharge. Baseline:  Goal status: Ongoing  2.  Patient will be able to return to typical walking program and normal pace without increased pain. Baseline: Patient reports increased pain with walking with son and his dog Goal status: Ongoing  3.  Patient will increase Lower Extremity Functional Scale score to at least 55% to demonstrate improved functional mobility. Baseline: 37.5% Goal status: Met on 03/30/23  4.  Patient will increase bilateral single leg stance to greater than 30 seconds to decrease risk of falling. Baseline:  Goal status: Ongoing  5.  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 Baseline:  Goal status: Met on 05/04/23   PLAN:  PT FREQUENCY: 2x/week  PT DURATION: 8 weeks  PLANNED INTERVENTIONS: 97164- PT Re-evaluation, 97110-Therapeutic exercises, 97530- Therapeutic activity, 97112- Neuromuscular re-education, 97535- Self Care, 42595- Manual therapy, [id number]- Gait training, [phone]- Canalith repositioning, [id number]- Aquatic Therapy, 97014- Electrical stimulation (unattended), [phone]- Electrical stimulation (manual), 97016- Vasopneumatic device, [id number]- Ultrasound, [id number]- Traction (mechanical), [id number]- Ionotophoresis 4mg /ml Dexamethasone, Patient/Family education, Balance training, Stair training, Taping, Dry Needling, Joint mobilization, Joint manipulation, Spinal manipulation, Spinal mobilization, Vestibular training, Cryotherapy, and Moist  heat  PLAN FOR NEXT SESSION: continue hip strengthening and flexibility; hip matrix; manual/ DN as indicated, balance, apply patch 3/6 if needed    Robyne Christen, PT, DPT 05/30/23, 9:56 AM  [facility] [address], [address] [place], [place] [address] Phone # [phone] Fax [phone]

## 2023-05-31 ENCOUNTER — Other Ambulatory Visit: Payer: Self-pay | Admitting: Emergency Medicine

## 2023-05-31 DIAGNOSIS — I10 Essential (primary) hypertension: Secondary | ICD-10-CM

## 2023-06-13 ENCOUNTER — Ambulatory Visit: Admitting: Rehabilitative and Restorative Service Providers"

## 2023-06-27 ENCOUNTER — Encounter: Payer: Self-pay | Admitting: Rehabilitative and Restorative Service Providers"

## 2023-06-27 ENCOUNTER — Ambulatory Visit: Attending: Family Medicine | Admitting: Rehabilitative and Restorative Service Providers"

## 2023-06-27 DIAGNOSIS — M6281 Muscle weakness (generalized): Secondary | ICD-10-CM | POA: Insufficient documentation

## 2023-06-27 DIAGNOSIS — M25552 Pain in left hip: Secondary | ICD-10-CM | POA: Insufficient documentation

## 2023-06-27 DIAGNOSIS — R252 Cramp and spasm: Secondary | ICD-10-CM | POA: Diagnosis present

## 2023-06-27 DIAGNOSIS — R2689 Other abnormalities of gait and mobility: Secondary | ICD-10-CM | POA: Diagnosis present

## 2023-06-27 NOTE — Therapy (Signed)
 OUTPATIENT PHYSICAL THERAPY TREATMENT NOTE AND DISCHARGE SUMMARY   Patient Name: Seth Pope MRN: [id number] DOB:1950/09/08, 73 y.o., male Today's Date: 06/27/2023   END OF SESSION:  PT End of Session - 06/27/23 0850     Visit Number 13    Date for PT Re-Evaluation 06/30/23    Authorization Type [employer]    Progress Note Due on Visit 20    PT Start Time 0847    PT Stop Time 0925    PT Time Calculation (min) 38 min    Activity Tolerance Patient tolerated treatment well    Behavior During Therapy [facility] for tasks assessed/performed                Past Medical History:  Diagnosis Date   Anxiety    Arthritis    hands   CAD (coronary artery disease)    a. cath 10/23/13: nonobstructive CAD, nl LV function, rec risk factor modification   Chronic kidney disease    h/o kidney stone - passed stone   Diabetes mellitus    type 2   History of pneumonia    Hyperlipidemia    Hypertension    Liver mass    Sinus bradycardia    a. as low as 36 bpm; b. multiple pauses 2.1-2.3 seconds   Ulcer    Umbilical hernia    Past Surgical History:  Procedure Laterality Date   BACK SURGERY     CERVICAL SPINE SURGERY     COLONOSCOPY  2015   Dr Tova Fresh 3 TAs, tics, hems   INGUINAL HERNIA REPAIR Bilateral 09/16/2019   Procedure: LAPAROSCOPIC BILATERAL INGUINAL HERNIA;  Surgeon: Jacolyn Matar, MD;  Location: [facility];  Service: General;  Laterality: Bilateral;   KNEE SURGERY Right 1970   LEFT HEART CATHETERIZATION WITH CORONARY ANGIOGRAM N/A 10/23/2013   Procedure: LEFT HEART CATHETERIZATION WITH CORONARY ANGIOGRAM;  Surgeon: Peter M Swaziland, MD;  Location: [facility] CATH LAB;  Service: Cardiovascular;  Laterality: N/A;   MICROLARYNGOSCOPY N/A 09/23/2015   Procedure: MICROLARYNGOSCOPY REMOVAL OF LESION;  Surgeon: Janita Mellow, MD;  Location: [facility] OR;  Service: ENT;  Laterality: N/A;   NM MYOCAR PERF WALL MOTION  08/2010   bruce myoview - normal perfusion in all regions, EF 66%, EKG negative for ischemia, no wall  motion abnormalities, normal/low risk study                                        ROTATOR CUFF REPAIR Bilateral 1991   x2   UMBILICAL HERNIA REPAIR  1988   Patient Active Problem List   Diagnosis Date Noted   Left hip pain 03/06/2023   Generalized anxiety disorder 03/02/2021   Family history of coronary artery disease in father 10/23/2013   Tobacco abuse 10/17/2013   Neuropathy, diabetic (HCC) 01/31/2012   Hypertension associated with diabetes (HCC)    Dyslipidemia associated with type 2 diabetes mellitus (HCC)    Hyperlipidemia     PCP: Elvira Hammersmith, MD  REFERRING PROVIDER: Syliva Even, MD  REFERRING DIAG: [phone] (ICD-10-CM) - Left hip pain  THERAPY DIAG:  Pain in left hip  Muscle weakness (generalized)  Other abnormalities of gait and mobility  Cramp and spasm  Rationale for Evaluation and Treatment: Rehabilitation  ONSET DATE: October 2024  SUBJECTIVE:   SUBJECTIVE STATEMENT: Patient states that he was able to walk around [place] and overall, is feeling better than  when he first started PT.  Patient states that he is not having any hip pain currently.  States that he knows that he has to work on his discipline with performing the exercises.  PERTINENT HISTORY: OA, Anxiety, Diabetes, CAD, HTN, Hx of lumbar fusion with reign cage in low back by Dr Ellery Guthrie  PAIN:  Are you having pain? No  PRECAUTIONS: None  RED FLAGS: None   WEIGHT BEARING RESTRICTIONS: No  FALLS:  Has patient fallen in last 6 months? 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  NEXT MD VISIT: Dr Alease Hunter on  04/19/23  OBJECTIVE:  Note: Objective measures were completed at Evaluation unless otherwise noted.  DIAGNOSTIC FINDINGS:  Left Hip Radiograph on 03/06/23: IMPRESSION: No acute osseous findings or significant arthropathic changes. Minimal degenerative changes of the hips and sacroiliac joints.  PATIENT SURVEYS:  Eval:  LEFS  30 / 80 = 37.5 % 03/30/2023:  Lower Extremity Functional Score: 43 / 80 = 53.8 % 05/04/2023:  Lower Extremity Functional Score: 58 / 80 = 72.5 % 06/27/2023:  Lower Extremity Functional Score: 65 / 80 = 81.3 %  COGNITION: Overall cognitive status: Within functional limits for tasks assessed     SENSATION: Patient denies numbness or tingling  MUSCLE LENGTH: Hamstrings: Tightness bilaterally, but L>R  POSTURE: rounded shoulders  PALPATION: Tender to palpation on left hip  LOWER EXTREMITY ROM:  Eval:  WFL, but some pain with left LE ROM  LOWER EXTREMITY MMT:  Eval: Left hip strength grossly 4/5  FUNCTIONAL TESTS:  Eval: 5 times sit to stand: 17.16 sec Single Leg Stance: Right 13.14  sec, Left 12.19 sec  03/30/2023: 5 times sit to stand:  13.02 sec 6 minute walk test:  1149 ft with pain increasing to 8/10 (pain started to increase at minute 2)  05/04/2023: Single Leg Stance: Right 19.97 sec, Left 29.35 sec  05/30/2023: 6 minute walk test:  1415 ft with pain increasing from a 4/10 to a 5/10 from start to finish  06/27/2023: Single Leg Stance: Right 22 sec, Left >30 sec  GAIT: Distance walked: >500 ft Assistive device utilized: None Level of assistance: Complete Independence Comments: Patient states that he gets some increased pain with walking, but overall, feels better when he walks                                                                                                                                TODAY'S TREATMENT   DATE: 06/27/2023 Nustep level 5 x6 min with PT present to discuss status Lower Extremity Functional Score: 65 / 80 =  81.3 %  Single leg stance Sit to/from stand holding 7# dumbbell 2x10 Farmer's carry holding 7# dumbbell performing high marching x10 each with weight switching hands Side stepping with yellow loop 3x10 ft bilat FWD step up onto 6" step with unilateral UE support x10 bilat Side step up onto 6" step with unilateral UE support x10 bilat Heel tap down from 2" foam pad x10 bilat   DATE: 05/30/2023 Recumbent bike level 3 x7 min with PT present to discuss status Seated hamstring stretch 2x20 sec bilat Seated piriformis stretch 2x20 sec bilat 6 minute walk test:  1415 ft with pain increasing from a 4/10 to a 5/10 Sit to/from stand holding 7# dumbbell x10 Farmer's carry holding 7# dumbbell performing high marching x10 each with weight switching hands Side stepping with yellow loop 3x10 ft bilat Single leg stance and reaching to cone on mat 2x10 bilat   DATE: 05/16/2023 Recumbent bike level 3 x6 min with PT present to discuss status Seated hamstring stretch 2x20 sec bilat Seated piriformis stretch 2x20 sec bilat Seated 3 way blue pball rollout 3x10 sec each Sit to/from stand holding 7# dumbbell x10 Farmer's carry holding 7# dumbbell performing high marching x10 each with weight switching hands Supine transversus abdominus contraction pressing ball into thighs 2x10 Supine clamshell with yellow loop 2x10 Supine marching with yellow loop around lower thighs 2x10 Supine partial bridges x10 Supine single knee to chest 2x10 sec    PATIENT EDUCATION:  Education details: Issued HEP Person educated: Patient Education method: Programmer, multimedia, Facilities manager, and Handouts Education comprehension: verbalized understanding and returned demonstration  HOME EXERCISE PROGRAM: Access Code: [email or web address] URL: [email or web address] Date: 05/04/2023 Prepared by: Chaneta Comer Taha Dimond  Exercises - Seated Hamstring Stretch  - 1 x daily - 7 x weekly - 2 reps - 20 sec hold - Seated Piriformis Stretch with  Trunk Bend  - 1 x daily - 7 x weekly - 2 sets - 30 hold - Seated Sciatic Tensioner  - 1 x daily - 7 x weekly - 2 sets - 10 reps - Sit to Stand Without Arm Support  - 1 x daily - 7 x weekly - 2 sets - 10 reps - Hip Flexion Stretch  - 1 x daily - 7 x weekly - 2 sets - 30 hold - Supine Hip Internal and External Rotation  - 1 x daily - 7 x weekly - 1 sets - 10 reps - Supine Gluteus Stretch  - 1 x daily - 7 x weekly - 2 sets - 30 hold - Small Range Straight Leg Raise  - 1 x daily - 7 x weekly - 2 sets - 10 reps - Supine Bridge  - 1 x daily - 7 x weekly - 2 sets - 10 reps - Clamshell  - 1 x daily - 7 x weekly - 2 sets - 10 reps - Side Stepping with Resistance at Thighs  - 1 x daily - 7 x weekly - 3 sets - Hip Extension with Resistance Loop  - 1 x daily - 7 x weekly - 2 sets - 10 reps - Single Leg Balance in March Position  - 1 x daily - 7 x weekly - 2 reps - 30 sec hold - Tandem Stance  - 1 x daily - 7 x weekly - 2 reps - 30 sec hold  ASSESSMENT:  CLINICAL IMPRESSION: Mr Woodridge presents to skilled PT reporting that he has noticed that overall, he is feeling better than when he first started.  Patient reports that he has  some days where he has zero pain.  He does continue to have some nights where he wakes up secondary to pain.  Patient able to skip a month of PT due to travel and he tolerated session well on his own.  Patient is able to walk his dogs on the normal path.  Educated patient on the importance of exercising and performing HEP consistently after discharge and he verbalizes his understanding.  Patient discharged from PT today to continue with HEP and follow up with MD as needed.    OBJECTIVE IMPAIRMENTS: decreased balance, difficulty walking, decreased strength, increased muscle spasms, impaired flexibility, postural dysfunction, and pain.   ACTIVITY LIMITATIONS: lifting, bending, standing, squatting, and stairs  PARTICIPATION LIMITATIONS: cleaning, community activity, and yard  work  PERSONAL FACTORS: Time since onset of injury/illness/exacerbation and 1-2 comorbidities: OA, Lumbar Fusion with reign cage are also affecting patient's functional outcome.   REHAB POTENTIAL: Good  CLINICAL DECISION MAKING: Stable/uncomplicated  EVALUATION COMPLEXITY: Low   GOALS: Goals reviewed with patient? Yes  SHORT TERM GOALS: Target date: 03/24/2023 Patient will be independent with initial HEP. Baseline: Goal status: MET 03/14/2023  2.  Patient will participate in a 6 minute walk test to establish a baseline for distance and pain. Baseline:  Goal status: Met on 03/30/2023   LONG TERM GOALS: Target date: 06/30/2023  Patient will be independent with advanced HEP to allow for self-progression after discharge. Baseline:  Goal status: Met on 06/27/23  2.  Patient will be able to return to typical walking program and normal pace without increased pain. Baseline: Patient reports increased pain with walking with son and his dog Goal status: Met on 06/27/23  3.  Patient will increase Lower Extremity Functional Scale score to at least 55% to demonstrate improved functional mobility. Baseline: 37.5% Goal status: Met on 03/30/23  4.  Patient will increase bilateral single leg stance to greater than 30 seconds to decrease risk of falling. Baseline:  Goal status: Partially Met (met for left LE, nearly met for right LE)  5.  Patient will increase left hip strength to [facility] to allow him to be able to perform yard work and carpentry without difficulty. Baseline:  Goal status: Met on 05/04/23   PLAN:  PT FREQUENCY: 2x/week  PT DURATION: 8 weeks  PLANNED INTERVENTIONS: 97164- PT Re-evaluation, 97110-Therapeutic exercises, 97530- Therapeutic activity, 97112- Neuromuscular re-education, 97535- Self Care, 14782- Manual therapy, [id number]- Gait training, [phone]- Canalith repositioning, [id number]- Aquatic Therapy, 97014- Electrical stimulation (unattended), [id number]- Electrical stimulation (manual),  [id number]- Vasopneumatic device, [id number]- Ultrasound, [id number]- Traction (mechanical), [id number]- Ionotophoresis 4mg /ml Dexamethasone , Patient/Family education, Balance training, Stair training, Taping, Dry Needling, Joint mobilization, Joint manipulation, Spinal manipulation, Spinal mobilization, Vestibular training, Cryotherapy, and Moist heat    PHYSICAL THERAPY DISCHARGE SUMMARY  See above for more information about episode of care.  Patient agrees to discharge. Patient goals were all met except for single leg balance on right leg. Patient is being discharged due to being pleased with the current functional level.     Robyne Christen, PT, DPT 06/27/23, 1:19 PM  [facility] [address] [place], [place] [address] Phone # [phone] Fax [phone]

## 2023-07-11 ENCOUNTER — Telehealth: Payer: Self-pay | Admitting: Emergency Medicine

## 2023-07-11 ENCOUNTER — Other Ambulatory Visit: Payer: Self-pay | Admitting: Radiology

## 2023-07-11 DIAGNOSIS — I10 Essential (primary) hypertension: Secondary | ICD-10-CM

## 2023-07-11 MED ORDER — HYDROCHLOROTHIAZIDE 12.5 MG PO CAPS
ORAL_CAPSULE | ORAL | 1 refills | Status: DC
Start: 2023-07-11 — End: 2023-10-05

## 2023-07-11 NOTE — Telephone Encounter (Signed)
 Prescription Request  07/11/2023  LOV: 03/06/2023  What is the name of the medication or equipment?  hydrochlorothiazide  (MICROZIDE ) 12.5 MG capsule  Have you contacted your pharmacy to request a refill? Yes   Which pharmacy would you like this sent to?  [id number] Jonette Nestle, [place] - [address] AT [facility] [address] [place] [address] Phone: [phone] Fax: [phone]    Patient notified that their request is being sent to the clinical staff for review and that they should receive a response within 2 business days.   Please advise at Mobile [phone] (mobile)

## 2023-07-22 ENCOUNTER — Other Ambulatory Visit: Payer: Self-pay | Admitting: Emergency Medicine

## 2023-07-22 DIAGNOSIS — I152 Hypertension secondary to endocrine disorders: Secondary | ICD-10-CM

## 2023-10-05 ENCOUNTER — Other Ambulatory Visit: Payer: Self-pay | Admitting: Emergency Medicine

## 2023-10-05 DIAGNOSIS — I10 Essential (primary) hypertension: Secondary | ICD-10-CM

## 2023-10-30 ENCOUNTER — Ambulatory Visit (INDEPENDENT_AMBULATORY_CARE_PROVIDER_SITE_OTHER): Admitting: Emergency Medicine

## 2023-10-30 ENCOUNTER — Encounter: Payer: Self-pay | Admitting: Emergency Medicine

## 2023-10-30 VITALS — BP 118/64 | HR 64 | Wt 163.0 lb

## 2023-10-30 DIAGNOSIS — Z1329 Encounter for screening for other suspected endocrine disorder: Secondary | ICD-10-CM

## 2023-10-30 DIAGNOSIS — Z130 Encounter for screening for diseases of the blood and blood-forming organs and certain disorders involving the immune mechanism: Secondary | ICD-10-CM

## 2023-10-30 DIAGNOSIS — E1142 Type 2 diabetes mellitus with diabetic polyneuropathy: Secondary | ICD-10-CM | POA: Diagnosis not present

## 2023-10-30 DIAGNOSIS — Z125 Encounter for screening for malignant neoplasm of prostate: Secondary | ICD-10-CM | POA: Diagnosis not present

## 2023-10-30 DIAGNOSIS — Z0001 Encounter for general adult medical examination with abnormal findings: Secondary | ICD-10-CM

## 2023-10-30 DIAGNOSIS — E785 Hyperlipidemia, unspecified: Secondary | ICD-10-CM | POA: Diagnosis not present

## 2023-10-30 DIAGNOSIS — E1159 Type 2 diabetes mellitus with other circulatory complications: Secondary | ICD-10-CM | POA: Diagnosis not present

## 2023-10-30 DIAGNOSIS — Z0000 Encounter for general adult medical examination without abnormal findings: Secondary | ICD-10-CM | POA: Diagnosis not present

## 2023-10-30 DIAGNOSIS — F411 Generalized anxiety disorder: Secondary | ICD-10-CM

## 2023-10-30 DIAGNOSIS — I152 Hypertension secondary to endocrine disorders: Secondary | ICD-10-CM | POA: Diagnosis not present

## 2023-10-30 DIAGNOSIS — E1169 Type 2 diabetes mellitus with other specified complication: Secondary | ICD-10-CM

## 2023-10-30 DIAGNOSIS — Z13228 Encounter for screening for other metabolic disorders: Secondary | ICD-10-CM

## 2023-10-30 DIAGNOSIS — Z7984 Long term (current) use of oral hypoglycemic drugs: Secondary | ICD-10-CM

## 2023-10-30 LAB — COMPREHENSIVE METABOLIC PANEL WITH GFR
ALT: 34 U/L (ref 0–53)
AST: 29 U/L (ref 0–37)
Albumin: 4.3 g/dL (ref 3.5–5.2)
Alkaline Phosphatase: 82 U/L (ref 39–117)
BUN: 13 mg/dL (ref 6–23)
CO2: 25 meq/L (ref 19–32)
Calcium: 9.4 mg/dL (ref 8.4–10.5)
Chloride: 103 meq/L (ref 96–112)
Creatinine, Ser: 0.86 mg/dL (ref 0.40–1.50)
GFR: 87.36 mL/min (ref >60.00)
Glucose, Bld: 191 mg/dL — ABNORMAL HIGH (ref 70–99)
Potassium: 4 meq/L (ref 3.5–5.1)
Sodium: 138 meq/L (ref 135–145)
Total Bilirubin: 0.7 mg/dL (ref 0.2–1.2)
Total Protein: 6.7 g/dL (ref 6.0–8.3)

## 2023-10-30 LAB — CBC WITH DIFFERENTIAL/PLATELET
Basophils Absolute: 0 K/uL (ref 0.0–0.1)
Basophils Relative: 0.6 % (ref 0.0–3.0)
Eosinophils Absolute: 0.2 K/uL (ref 0.0–0.7)
Eosinophils Relative: 2.5 % (ref 0.0–5.0)
HCT: 49.3 % (ref 39.0–52.0)
Hemoglobin: 16.3 g/dL (ref 13.0–17.0)
Lymphocytes Relative: 23 % (ref 12.0–46.0)
Lymphs Abs: 1.6 K/uL (ref 0.7–4.0)
MCHC: 33.1 g/dL (ref 30.0–36.0)
MCV: 90 fl (ref 78.0–100.0)
Monocytes Absolute: 0.6 K/uL (ref 0.1–1.0)
Monocytes Relative: 9.5 % (ref 3.0–12.0)
Neutro Abs: 4.4 K/uL (ref 1.4–7.7)
Neutrophils Relative %: 64.4 % (ref 43.0–77.0)
Platelets: 279 K/uL (ref 150.0–400.0)
RBC: 5.48 Mil/uL (ref 4.22–5.81)
RDW: 14.5 % (ref 11.5–15.5)
WBC: 6.8 K/uL (ref 4.0–10.5)

## 2023-10-30 LAB — POCT GLYCOSYLATED HEMOGLOBIN (HGB A1C): Hemoglobin A1C: 7.3 % — AB (ref 4.0–5.6)

## 2023-10-30 LAB — MICROALBUMIN / CREATININE URINE RATIO
Creatinine,U: 33.1 mg/dL
Microalb Creat Ratio: UNDETERMINED mg/g (ref 0.0–30.0)
Microalb, Ur: <0.7 mg/dL

## 2023-10-30 LAB — LIPID PANEL
Cholesterol: 133 mg/dL (ref 0–200)
HDL: 50.9 mg/dL (ref >39.00)
LDL Cholesterol: 59 mg/dL (ref 0–99)
NonHDL: 82.14
Total CHOL/HDL Ratio: 3
Triglycerides: 114 mg/dL (ref 0.0–149.0)
VLDL: 22.8 mg/dL (ref 0.0–40.0)

## 2023-10-30 LAB — PSA: PSA: 4.3 ng/mL — ABNORMAL HIGH (ref 0.10–4.00)

## 2023-10-30 NOTE — Assessment & Plan Note (Signed)
Had gabapentin reduced to 400 mg twice a day

## 2023-10-30 NOTE — Assessment & Plan Note (Signed)
 Chronic stable conditions Hemoglobin A1c is 7.3 today Continue Tresiba , atorvastatin , Jardiance , and metformin  Diet and nutrition discussed

## 2023-10-30 NOTE — Progress Notes (Signed)
 Seth Pope 73 y.o.   Chief Complaint  Patient presents with   Annual Exam    Patient here for physical. 6 month f/u for HTN / DM.     HISTORY OF PRESENT ILLNESS: This is a 73 y.o. male here for annual exam and follow-up on chronic medical conditions including diabetes and hypertension Overall doing well.  Has no complaints or medical concerns today.  HPI   Prior to Admission medications   Medication Sig Start Date End Date Taking? Authorizing Provider  ALPRAZolam  (XANAX  XR) 1 MG 24 hr tablet Take 1 mg by mouth daily.   Yes [provider]  ALPRAZolam  (XANAX ) 0.25 MG tablet Take 0.25 mg by mouth 2 (two) times daily as needed for anxiety.  03/16/16  Yes [provider]  atorvastatin  (LIPITOR ) 80 MG tablet TAKE 1 TABLET(80 MG) BY MOUTH DAILY 04/24/23  Yes Kanyla Omeara, Emil Schanz, MD  cyclobenzaprine  (FLEXERIL ) 10 MG tablet Take 1 tablet (10 mg total) by mouth at bedtime. Take as needed. 03/08/23  Yes Joane Artist RAMAN, MD  desipramine  (NORPRAMIN ) 50 MG tablet Take 150 mg by mouth at bedtime.    Yes [provider]  fluticasone  (FLONASE ) 50 MCG/ACT nasal spray Place 2 sprays into both nostrils daily. 11/26/21  Yes Elnor Lauraine BRAVO, NP  gabapentin  (NEURONTIN ) 300 MG capsule TAKE 2 CAPSULES(600 MG) BY MOUTH TWICE DAILY 05/20/22  Yes Tharon Kitch, Daniel, MD  glucose blood (ONETOUCH VERIO) test strip USE TO CHECK BLOOD SUGAR UP TO THREE TIMES DAILY 05/05/22  Yes Carmell Elgin, Emil Schanz, MD  hydrochlorothiazide  (MICROZIDE ) 12.5 MG capsule TAKE ONE CAPSULE BY MOUTH DAILY 10/05/23  Yes Awais Cobarrubias Jose, MD  Insulin  Pen Needle 32G X 6 MM MISC Attach pen needle to Flexpen to check blood glucose. 04/13/22  Yes Jean Skow, Emil Schanz, MD  JARDIANCE  25 MG TABS tablet TAKE 1 TABLET(25 MG) BY MOUTH DAILY BEFORE BREAKFAST 04/02/23  Yes Albaro Deviney, Emil Schanz, MD  Lancets [facility] ULTRASOFT) lancets Use as instructed 04/05/22  Yes Egbert Seidel, Emil Schanz, MD  metFORMIN  (GLUCOPHAGE -XR) 750 MG 24  hr tablet Take 750 mg by mouth 2 (two) times daily. 09/14/23  Yes [provider]  metoprolol  tartrate (LOPRESSOR ) 100 MG tablet TAKE 1 TABLET(100 MG) BY MOUTH TWICE DAILY 05/31/23  Yes Sierra Bissonette, Emil Schanz, MD  TRESIBA  FLEXTOUCH 100 UNIT/ML FlexTouch Pen ADMINISTER 10 UNITS UNDER THE SKIN DAILY 07/23/23  Yes Antoney Biven, Emil Schanz, MD  metFORMIN  (GLUCOPHAGE -XR) 500 MG 24 hr tablet TAKE 2 TABLETS(1000 MG) BY MOUTH IN THE MORNING AND AT BEDTIME Patient not taking: Reported on 10/30/2023 10/28/22   Nolie Bignell Jose, MD    Allergies  Allergen Reactions   Penicillins Rash    Has patient had a PCN reaction causing immediate rash, facial/tongue/throat swelling, SOB or lightheadedness with hypotension: Yes Has patient had a PCN reaction causing severe rash involving mucus membranes or skin necrosis: No Has patient had a PCN reaction that required hospitalization No Has patient had a PCN reaction occurring within the last 10 years: No If all of the above answers are NO, then may proceed with Cephalosporins. Occurred in childhood.     Patient Active Problem List   Diagnosis Date Noted   Generalized anxiety disorder 03/02/2021   Family history of coronary artery disease in father 10/23/2013   Tobacco abuse 10/17/2013   Neuropathy, diabetic (HCC) 01/31/2012   Hypertension associated with diabetes (HCC)    Dyslipidemia associated with type 2 diabetes mellitus (HCC)    Hyperlipidemia  Past Medical History:  Diagnosis Date   Anxiety    Arthritis    hands   CAD (coronary artery disease)    a. cath 10/23/13: nonobstructive CAD, nl LV function, rec risk factor modification   Chronic kidney disease    h/o kidney stone - passed stone   Diabetes mellitus    type 2   History of pneumonia    Hyperlipidemia    Hypertension    Liver mass    Sinus bradycardia    a. as low as 36 bpm; b. multiple pauses 2.1-2.3 seconds   Ulcer    Umbilical hernia     Past Surgical History:   Procedure Laterality Date   BACK SURGERY     CERVICAL SPINE SURGERY     COLONOSCOPY  2015   Dr Kristie 3 TAs, tics, hems   INGUINAL HERNIA REPAIR Bilateral 09/16/2019   Procedure: LAPAROSCOPIC BILATERAL INGUINAL HERNIA;  Surgeon: Gladis Cough, MD;  Location: [facility] ORS;  Service: General;  Laterality: Bilateral;   KNEE SURGERY Right 1970   LEFT HEART CATHETERIZATION WITH CORONARY ANGIOGRAM N/A 10/23/2013   Procedure: LEFT HEART CATHETERIZATION WITH CORONARY ANGIOGRAM;  Surgeon: Peter M Swazil[place], MD;  Location: [facility] CATH LAB;  Service: Cardiovascular;  Laterality: N/A;   MICROLARYNGOSCOPY N/A 09/23/2015   Procedure: MICROLARYNGOSCOPY REMOVAL OF LESION;  Surgeon: Ida Loader, MD;  Location: [facility] OR;  Service: ENT;  Laterality: N/A;   NM MYOCAR PERF WALL MOTION  08/2010   bruce myoview - normal perfusion in all regions, EF 66%, EKG negative for ischemia, no wall motion abnormalities, normal/low risk study                                        ROTATOR CUFF REPAIR Bilateral 1991   x2   UMBILICAL HERNIA REPAIR  1988    Social History   Socioeconomic History   Marital status: Married    Spouse name: Sari   Number of children: 2   Years of education: 10th    Highest education level: 10th grade  Occupational History   Occupation: self-employed (Production designer, theatre/television/film)  Tobacco Use   Smoking status: Former    Current packs/day: 0.00    Average packs/day: 2.5 packs/day for 18.0 years (45.0 ttl pk-yrs)    Types: Cigarettes    Start date: 07/15/1968    Quit date: 07/16/1986    Years since quitting: 37.3   Smokeless tobacco: Never  Vaping Use   Vaping status: Never Used  Substance and Sexual Activity   Alcohol use: Not Currently   Drug use: Not Currently    Types: Marijuana    Comment: Last use May 2021   Sexual activity: Yes  Other Topics Concern   Not on file  Social History Narrative   Lives at home w/ his wife   Right-handed   Daily caffeine   Social Drivers of Health   Financial Resource  Strain: Low Risk  (10/29/2023)   Overall Financial Resource Strain (CARDIA)    Difficulty of Paying Living Expenses: Not hard at all  Food Insecurity: No Food Insecurity (10/29/2023)   Hunger Vital Sign    Worried About Running Out of Food in the Last Year: Never true    Ran Out of Food in the Last Year: Never true  Transportation Needs: No Transportation Needs (10/29/2023)   PRAPARE - Administrator, Civil Service (Medical): No  Lack of Transportation (Non-Medical): No  Physical Activity: Sufficiently Active (10/29/2023)   Exercise Vital Sign    Days of Exercise per Week: 4 days    Minutes of Exercise per Session: 120 min  Stress: No Stress Concern Present (10/29/2023)   [employer] of Occupational Health - Occupational Stress Questionnaire    Feeling of Stress: Only a little  Social Connections: Socially Integrated (10/29/2023)   Social Connection and Isolation Panel    Frequency of Communication with Friends and Family: More than three times a week    Frequency of Social Gatherings with Friends and Family: Twice a week    Attends Religious Services: More than 4 times per year    Active Member of [employer] or Organizations: Yes    Attends Engineer, structural: More than 4 times per year    Marital Status: Married  Catering manager Violence: Not on file    Family History  Problem Relation Age of Onset   Heart attack Father    Hypertension Father    Heart disease Father    Diabetes Sister        HTN   Diabetes Brother        HTN, heart problems   Hypertension Other    Diabetes Paternal Grandmother    Heart disease Paternal Grandfather    Colon cancer Neg Hx    Rectal cancer Neg Hx    Stomach cancer Neg Hx      Review of Systems  Constitutional: Negative.  Negative for chills and fever.  HENT: Negative.  Negative for congestion and sore throat.   Respiratory: Negative.  Negative for cough and shortness of breath.   Cardiovascular: Negative.   Negative for chest pain and palpitations.  Gastrointestinal:  Negative for abdominal pain, diarrhea, nausea and vomiting.  Genitourinary: Negative.  Negative for dysuria and hematuria.  Skin: Negative.  Negative for rash.  Neurological: Negative.  Negative for dizziness and headaches.  All other systems reviewed and are negative.   Vitals:   10/30/23 1303  BP: 118/64  Pulse: 64  SpO2: 95%    Physical Exam Vitals reviewed.  Constitutional:      Appearance: Normal appearance.  HENT:     Head: Normocephalic.     Right Ear: Tympanic membrane, ear canal and external ear normal.     Left Ear: Tympanic membrane, ear canal and external ear normal.     Mouth/Throat:     Mouth: Mucous membranes are moist.     Pharynx: Oropharynx is clear.  Eyes:     Extraocular Movements: Extraocular movements intact.     Pupils: Pupils are equal, round, and reactive to light.  Cardiovascular:     Rate and Rhythm: Normal rate and regular rhythm.     Pulses: Normal pulses.     Heart sounds: Normal heart sounds.  Pulmonary:     Effort: Pulmonary effort is normal.     Breath sounds: Normal breath sounds.  Abdominal:     Palpations: Abdomen is soft.     Tenderness: There is no abdominal tenderness.  Musculoskeletal:     Cervical back: No tenderness.     Right lower leg: No edema.     Left lower leg: No edema.  Lymphadenopathy:     Cervical: No cervical adenopathy.  Skin:    General: Skin is warm and dry.     Capillary Refill: Capillary refill takes less than 2 seconds.  Neurological:     General: No focal deficit present.  Mental Status: He is alert and oriented to person, place, and time.  Psychiatric:        Mood and Affect: Mood normal.        Behavior: Behavior normal.    Results for orders placed or performed in visit on 10/30/23 (from the past 24 hours)  POCT HgB A1C     Status: Abnormal   Collection Time: 10/30/23  1:47 PM  Result Value Ref Range   Hemoglobin A1C 7.3 (A) 4.0 -  5.6 %   HbA1c POC (<> result, manual entry)     HbA1c, POC (prediabetic range)     HbA1c, POC (controlled diabetic range)       ASSESSMENT & PLAN:  Problem List Items Addressed This Visit       Cardiovascular and Mediastinum   Hypertension associated with diabetes (HCC)   BP Readings from Last 3 Encounters:  10/30/23 118/64  04/19/23 120/70  03/08/23 136/82  Well-controlled hypertension Continue hydrochlorothiazide  12.5 mg daily and metoprolol  tartrate 100 mg twice a day Well-controlled diabetes with hemoglobin A1c of 7.3 Continues daily Tresiba  13 units, metformin  1000 mg twice a day, and Jardiance  25 mg daily Cardiovascular risks associated with hypertension and diabetes discussed Diet and nutrition discussed Blood work and urine done today Follow-up in 6 months       Relevant Medications   metFORMIN  (GLUCOPHAGE -XR) 750 MG 24 hr tablet   Other Relevant Orders   Microalbumin / creatinine urine ratio   Comprehensive metabolic panel with GFR   Lipid panel     Endocrine   Dyslipidemia associated with type 2 diabetes mellitus (HCC)   Chronic stable conditions Hemoglobin A1c is 7.3 today Continue Tresiba , atorvastatin , Jardiance , and metformin  Diet and nutrition discussed      Relevant Medications   metFORMIN  (GLUCOPHAGE -XR) 750 MG 24 hr tablet   Other Relevant Orders   Microalbumin / creatinine urine ratio   Comprehensive metabolic panel with GFR   Lipid panel   Neuropathy, diabetic (HCC)   Had gabapentin  reduced to 400 mg twice a day       Relevant Medications   metFORMIN  (GLUCOPHAGE -XR) 750 MG 24 hr tablet     Other   Generalized anxiety disorder   Stable and well-controlled. Follows up with psychiatry on a regular basis. Their office is handling his anxiety medications.       Other Visit Diagnoses       Encounter for general adult medical examination with abnormal findings    -  Primary   Relevant Orders   Comprehensive metabolic panel with GFR    CBC with Differential/Platelet   Lipid panel   PSA     Screening for deficiency anemia       Relevant Orders   CBC with Differential/Platelet     Screening for endocrine, metabolic and immunity disorder       Relevant Orders   Comprehensive metabolic panel with GFR     Screening for prostate cancer       Relevant Orders   PSA      Modifiable risk factors discussed with patient. Anticipatory guidance according to age provided. The following topics were also discussed: Social Determinants of Health Smoking.  Non-smoker Diet and nutrition Benefits of exercise Cancer screening and review of most recent colonoscopy report from 2021 Vaccinations review and recommendations Cardiovascular risk assessment Review of multiple chronic medical conditions and their management Review of all medications Mental health including depression and anxiety Fall and accident prevention  Patient Instructions  Health Maintenance, Male Adopting a healthy lifestyle and getting preventive care are important in promoting health and wellness. Ask your health care provider about: The right schedule for you to have regular tests and exams. Things you can do on your own to prevent diseases and keep yourself healthy. What should I know about diet, weight, and exercise? Eat a healthy diet  Eat a diet that includes plenty of vegetables, fruits, low-fat dairy products, and lean protein. Do not eat a lot of foods that are high in solid fats, added sugars, or sodium. Maintain a healthy weight Body mass index (BMI) is a measurement that can be used to identify possible weight problems. It estimates body fat based on height and weight. Your health care provider can help determine your BMI and help you achieve or maintain a healthy weight. Get regular exercise Get regular exercise. This is one of the most important things you can do for your health. Most adults should: Exercise for at least 150 minutes each week.  The exercise should increase your heart rate and make you sweat (moderate-intensity exercise). Do strengthening exercises at least twice a week. This is in addition to the moderate-intensity exercise. Spend less time sitting. Even light physical activity can be beneficial. Watch cholesterol and blood lipids Have your blood tested for lipids and cholesterol at 73 years of age, then have this test every 5 years. You may need to have your cholesterol levels checked more often if: Your lipid or cholesterol levels are high. You are older than 71 years of age. You are at high risk for heart disease. What should I know about cancer screening? Many types of cancers can be detected early and may often be prevented. Depending on your health history and family history, you may need to have cancer screening at various ages. This may include screening for: Colorectal cancer. Prostate cancer. Skin cancer. Lung cancer. What should I know about heart disease, diabetes, and high blood pressure? Blood pressure and heart disease High blood pressure causes heart disease and increases the risk of stroke. This is more likely to develop in people who have high blood pressure readings or are overweight. Talk with your health care provider about your target blood pressure readings. Have your blood pressure checked: Every 3-5 years if you are 59-29 years of age. Every year if you are 40 years old or older. If you are between the ages of 86 and 33 and are a current or former smoker, ask your health care provider if you should have a one-time screening for abdominal aortic aneurysm (AAA). Diabetes Have regular diabetes screenings. This checks your fasting blood sugar level. Have the screening done: Once every three years after age 34 if you are at a normal weight and have a low risk for diabetes. More often and at a younger age if you are overweight or have a high risk for diabetes. What should I know about  preventing infection? Hepatitis B If you have a higher risk for hepatitis B, you should be screened for this virus. Talk with your health care provider to find out if you are at risk for hepatitis B infection. Hepatitis C Blood testing is recommended for: Everyone born from [birth year] through 1963. Anyone with known risk factors for hepatitis C. Sexually transmitted infections (STIs) You should be screened each year for STIs, including gonorrhea and chlamydia, if: You are sexually active and are younger than 71 years of age. You are older than 71 years of age and  your health care provider tells you that you are at risk for this type of infection. Your sexual activity has changed since you were last screened, and you are at increased risk for chlamydia or gonorrhea. Ask your health care provider if you are at risk. Ask your health care provider about whether you are at high risk for HIV. Your health care provider may recommend a prescription medicine to help prevent HIV infection. If you choose to take medicine to prevent HIV, you should first get tested for HIV. You should then be tested every 3 months for as long as you are taking the medicine. Follow these instructions at home: Alcohol use Do not drink alcohol if your health care provider tells you not to drink. If you drink alcohol: Limit how much you have to 0-2 drinks a day. Know how much alcohol is in your drink. In the [place] one drink equals one 12 oz bottle of beer (355 mL), one 5 oz glass of wine (148 mL), or one 1 oz glass of hard liquor (44 mL). Lifestyle Do not use any products that contain nicotine or tobacco. These products include cigarettes, chewing tobacco, and vaping devices, such as e-cigarettes. If you need help quitting, ask your health care provider. Do not use street drugs. Do not share needles. Ask your health care provider for help if you need support or information about quitting drugs. General instructions Schedule  regular health, dental, and eye exams. Stay current with your vaccines. Tell your health care provider if: You often feel depressed. You have ever been abused or do not feel safe at home. Summary Adopting a healthy lifestyle and getting preventive care are important in promoting health and wellness. Follow your health care provider's instructions about healthy diet, exercising, and getting tested or screened for diseases. Follow your health care provider's instructions on monitoring your cholesterol and blood pressure. This information is not intended to replace advice given to you by your health care provider. Make sure you discuss any questions you have with your health care provider. Document Revised: 06/22/2020 Document Reviewed: 06/22/2020 Elsevier Patient Education  2024 Elsevier Inc.     Emil Schaumann, MD [facility]

## 2023-10-30 NOTE — Assessment & Plan Note (Addendum)
 BP Readings from Last 3 Encounters:  10/30/23 118/64  04/19/23 120/70  03/08/23 136/82  Well-controlled hypertension Continue hydrochlorothiazide  12.5 mg daily and metoprolol  tartrate 100 mg twice a day Well-controlled diabetes with hemoglobin A1c of 7.3 Continues daily Tresiba  13 units, metformin  1000 mg twice a day, and Jardiance  25 mg daily Cardiovascular risks associated with hypertension and diabetes discussed Diet and nutrition discussed Blood work and urine done today Follow-up in 6 months

## 2023-10-30 NOTE — Assessment & Plan Note (Signed)
Stable and well-controlled.  Follows up with psychiatry on a regular basis.  Their office is handling his anxiety medications.

## 2023-10-30 NOTE — Patient Instructions (Signed)
 Health Maintenance, Male  Adopting a healthy lifestyle and getting preventive care are important in promoting health and wellness. Ask your health care provider about:  The right schedule for you to have regular tests and exams.  Things you can do on your own to prevent diseases and keep yourself healthy.  What should I know about diet, weight, and exercise?  Eat a healthy diet    Eat a diet that includes plenty of vegetables, fruits, low-fat dairy products, and lean protein.  Do not eat a lot of foods that are high in solid fats, added sugars, or sodium.  Maintain a healthy weight  Body mass index (BMI) is a measurement that can be used to identify possible weight problems. It estimates body fat based on height and weight. Your health care provider can help determine your BMI and help you achieve or maintain a healthy weight.  Get regular exercise  Get regular exercise. This is one of the most important things you can do for your health. Most adults should:  Exercise for at least 150 minutes each week. The exercise should increase your heart rate and make you sweat (moderate-intensity exercise).  Do strengthening exercises at least twice a week. This is in addition to the moderate-intensity exercise.  Spend less time sitting. Even light physical activity can be beneficial.  Watch cholesterol and blood lipids  Have your blood tested for lipids and cholesterol at 71 years of age, then have this test every 5 years.  You may need to have your cholesterol levels checked more often if:  Your lipid or cholesterol levels are high.  You are older than 71 years of age.  You are at high risk for heart disease.  What should I know about cancer screening?  Many types of cancers can be detected early and may often be prevented. Depending on your health history and family history, you may need to have cancer screening at various ages. This may include screening for:  Colorectal cancer.  Prostate cancer.  Skin cancer.  Lung  cancer.  What should I know about heart disease, diabetes, and high blood pressure?  Blood pressure and heart disease  High blood pressure causes heart disease and increases the risk of stroke. This is more likely to develop in people who have high blood pressure readings or are overweight.  Talk with your health care provider about your target blood pressure readings.  Have your blood pressure checked:  Every 3-5 years if you are 24-52 years of age.  Every year if you are 3 years old or older.  If you are between the ages of 60 and 72 and are a current or former smoker, ask your health care provider if you should have a one-time screening for abdominal aortic aneurysm (AAA).  Diabetes  Have regular diabetes screenings. This checks your fasting blood sugar level. Have the screening done:  Once every three years after age 66 if you are at a normal weight and have a low risk for diabetes.  More often and at a younger age if you are overweight or have a high risk for diabetes.  What should I know about preventing infection?  Hepatitis B  If you have a higher risk for hepatitis B, you should be screened for this virus. Talk with your health care provider to find out if you are at risk for hepatitis B infection.  Hepatitis C  Blood testing is recommended for:  Everyone born from [birth year] through 1963.  Anyone  with known risk factors for hepatitis C.  Sexually transmitted infections (STIs)  You should be screened each year for STIs, including gonorrhea and chlamydia, if:  You are sexually active and are younger than 71 years of age.  You are older than 71 years of age and your health care provider tells you that you are at risk for this type of infection.  Your sexual activity has changed since you were last screened, and you are at increased risk for chlamydia or gonorrhea. Ask your health care provider if you are at risk.  Ask your health care provider about whether you are at high risk for HIV. Your health care provider  may recommend a prescription medicine to help prevent HIV infection. If you choose to take medicine to prevent HIV, you should first get tested for HIV. You should then be tested every 3 months for as long as you are taking the medicine.  Follow these instructions at home:  Alcohol use  Do not drink alcohol if your health care provider tells you not to drink.  If you drink alcohol:  Limit how much you have to 0-2 drinks a day.  Know how much alcohol is in your drink. In the [place] one drink equals one 12 oz bottle of beer (355 mL), one 5 oz glass of wine (148 mL), or one 1 oz glass of hard liquor (44 mL).  Lifestyle  Do not use any products that contain nicotine or tobacco. These products include cigarettes, chewing tobacco, and vaping devices, such as e-cigarettes. If you need help quitting, ask your health care provider.  Do not use street drugs.  Do not share needles.  Ask your health care provider for help if you need support or information about quitting drugs.  General instructions  Schedule regular health, dental, and eye exams.  Stay current with your vaccines.  Tell your health care provider if:  You often feel depressed.  You have ever been abused or do not feel safe at home.  Summary  Adopting a healthy lifestyle and getting preventive care are important in promoting health and wellness.  Follow your health care provider's instructions about healthy diet, exercising, and getting tested or screened for diseases.  Follow your health care provider's instructions on monitoring your cholesterol and blood pressure.  This information is not intended to replace advice given to you by your health care provider. Make sure you discuss any questions you have with your health care provider.  Document Revised: 06/22/2020 Document Reviewed: 06/22/2020  Elsevier Patient Education  2024 [employer].

## 2023-10-31 ENCOUNTER — Ambulatory Visit: Payer: Self-pay | Admitting: Emergency Medicine

## 2023-10-31 DIAGNOSIS — R972 Elevated prostate specific antigen [PSA]: Secondary | ICD-10-CM

## 2023-11-13 ENCOUNTER — Telehealth: Payer: Self-pay

## 2023-11-13 NOTE — Telephone Encounter (Signed)
 Copied from CRM #[id number]. Topic: Clinical - Prescription Issue >> Nov 13, 2023  9:16 AM Timindy P wrote: Reason for CRM: Pt is calling regarding Metformin , He is stating his dosage was changed from 500MG  to 750MG  however his medication list is reflecting both prescriptions and he is worried this will have a negative effect on his insurance. He is asking if the 500MG  of metformin  can please be removed from his medication list. Pt can be reached at [phone] with any questions or concerns.

## 2023-11-16 ENCOUNTER — Other Ambulatory Visit: Payer: Self-pay | Admitting: Radiology

## 2023-12-05 ENCOUNTER — Other Ambulatory Visit: Payer: Self-pay | Admitting: Emergency Medicine

## 2023-12-05 DIAGNOSIS — I10 Essential (primary) hypertension: Secondary | ICD-10-CM

## 2023-12-05 MED ORDER — METOPROLOL TARTRATE 100 MG PO TABS: 100.0000 mg | ORAL_TABLET | Freq: Two times a day (BID) | ORAL | 1 refills | Status: AC

## 2023-12-05 NOTE — Telephone Encounter (Signed)
 Copied from CRM #[id number]. Topic: Clinical - Medication Refill >> Dec 05, 2023  8:08 AM Berneda FALCON wrote: Medication: metoprolol  tartrate (LOPRESSOR ) 100 MG   Pt has a flight in the morning and wants to be sure this is filled before he leaves, he only has 2 left  Has the patient contacted their pharmacy? Yes (Agent: If no, request that the patient contact the pharmacy for the refill. If patient does not wish to contact the pharmacy document the reason why and proceed with request.) (Agent: If yes, when and what did the pharmacy advise?)  This is the patient's preferred pharmacy:  [id number] [address], [place] - [address] AT [facility] OF [facility] [address] [place] [address] Phone: [phone] Fax: [phone]  Is this the correct pharmacy for this prescription? Yes If no, delete pharmacy and type the correct one.   Has the prescription been filled recently? No  Is the patient out of the medication? No  Has the patient been seen for an appointment in the last year OR does the patient have an upcoming appointment? Yes  Can we respond through MyChart? Yes  Agent: Please be advised that Rx refills may take up to 3 business days. We ask that you follow-up with your pharmacy.

## 2024-01-03 ENCOUNTER — Other Ambulatory Visit: Payer: Self-pay | Admitting: Urology

## 2024-01-03 DIAGNOSIS — R3912 Poor urinary stream: Secondary | ICD-10-CM

## 2024-01-03 DIAGNOSIS — R972 Elevated prostate specific antigen [PSA]: Secondary | ICD-10-CM

## 2024-01-03 DIAGNOSIS — D400 Neoplasm of uncertain behavior of prostate: Secondary | ICD-10-CM

## 2024-01-04 ENCOUNTER — Encounter: Payer: Self-pay | Admitting: Urology

## 2024-01-18 ENCOUNTER — Ambulatory Visit
Admission: RE | Admit: 2024-01-18 | Discharge: 2024-01-18 | Disposition: A | Source: Ambulatory Visit | Attending: Urology | Admitting: Urology

## 2024-01-18 DIAGNOSIS — R3912 Poor urinary stream: Secondary | ICD-10-CM

## 2024-01-18 DIAGNOSIS — D400 Neoplasm of uncertain behavior of prostate: Secondary | ICD-10-CM

## 2024-01-18 DIAGNOSIS — R972 Elevated prostate specific antigen [PSA]: Secondary | ICD-10-CM

## 2024-01-18 MED ORDER — GADOPICLENOL 0.5 MMOL/ML IV SOLN
10.0000 mL | Freq: Once | INTRAVENOUS | Status: AC | PRN
  Administered 2024-01-18: 8 mL via INTRAVENOUS

## 2024-04-29 ENCOUNTER — Ambulatory Visit: Admitting: Emergency Medicine
